# Patient Record
Sex: Male | Born: 1995
Health system: Southern US, Community
[De-identification: ages and names within clinical notes are randomized; demographics above are authoritative.]

## PROBLEM LIST (undated history)

## (undated) DIAGNOSIS — F3010 Manic episode without psychotic symptoms, unspecified: Secondary | ICD-10-CM

## (undated) DIAGNOSIS — F39 Unspecified mood [affective] disorder: Secondary | ICD-10-CM

---

## 2015-08-30 DIAGNOSIS — F39 Unspecified mood [affective] disorder: Secondary | ICD-10-CM

## 2015-08-30 HISTORY — DX: Unspecified mood (affective) disorder: F39

## 2016-08-21 ENCOUNTER — Encounter (HOSPITAL_COMMUNITY): Payer: Self-pay | Admitting: *Deleted

## 2016-08-21 ENCOUNTER — Emergency Department (HOSPITAL_COMMUNITY)
Admission: EM | Admit: 2016-08-21 | Discharge: 2016-08-23 | Disposition: A | Discharge status: Other Acute Inpatient Hospital | Payer: Self-pay | Attending: Emergency Medicine | Admitting: Emergency Medicine

## 2016-08-21 DIAGNOSIS — Z79899 Other long term (current) drug therapy: Secondary | ICD-10-CM | POA: Insufficient documentation

## 2016-08-21 DIAGNOSIS — Z008 Encounter for other general examination: Secondary | ICD-10-CM

## 2016-08-21 DIAGNOSIS — F250 Schizoaffective disorder, bipolar type: Secondary | ICD-10-CM | POA: Insufficient documentation

## 2016-08-21 HISTORY — DX: Unspecified mood (affective) disorder: F39

## 2016-08-21 HISTORY — DX: Manic episode without psychotic symptoms, unspecified: F30.10

## 2016-08-21 LAB — RAPID URINE DRUG SCREEN, HOSP PERFORMED
AMPHETAMINES: NOT DETECTED
BARBITURATES: NOT DETECTED
BENZODIAZEPINES: NOT DETECTED
COCAINE: NOT DETECTED
Opiates: NOT DETECTED
TETRAHYDROCANNABINOL: POSITIVE — AB

## 2016-08-21 LAB — CBC WITH DIFFERENTIAL/PLATELET
BASOS ABS: 0 10*3/uL (ref 0.0–0.1)
BASOS PCT: 0 %
Eosinophils Absolute: 0 10*3/uL (ref 0.0–0.7)
Eosinophils Relative: 0 %
HEMATOCRIT: 44.9 % (ref 39.0–52.0)
HEMOGLOBIN: 15.1 g/dL (ref 13.0–17.0)
Lymphocytes Relative: 15 %
Lymphs Abs: 1.7 10*3/uL (ref 0.7–4.0)
MCH: 28.3 pg (ref 26.0–34.0)
MCHC: 33.6 g/dL (ref 30.0–36.0)
MCV: 84.2 fL (ref 78.0–100.0)
MONOS PCT: 7 %
Monocytes Absolute: 0.8 10*3/uL (ref 0.1–1.0)
NEUTROS ABS: 8.3 10*3/uL — AB (ref 1.7–7.7)
NEUTROS PCT: 78 %
Platelets: 246 10*3/uL (ref 150–400)
RBC: 5.33 MIL/uL (ref 4.22–5.81)
RDW: 12.4 % (ref 11.5–15.5)
WBC: 10.8 10*3/uL — ABNORMAL HIGH (ref 4.0–10.5)

## 2016-08-21 LAB — COMPREHENSIVE METABOLIC PANEL
ALBUMIN: 4.5 g/dL (ref 3.5–5.0)
ALT: 17 U/L (ref 17–63)
ANION GAP: 14 (ref 5–15)
AST: 23 U/L (ref 15–41)
Alkaline Phosphatase: 62 U/L (ref 38–126)
BILIRUBIN TOTAL: 1.3 mg/dL — AB (ref 0.3–1.2)
BUN: 11 mg/dL (ref 6–20)
CO2: 21 mmol/L — ABNORMAL LOW (ref 22–32)
Calcium: 10.1 mg/dL (ref 8.9–10.3)
Chloride: 104 mmol/L (ref 101–111)
Creatinine, Ser: 1.39 mg/dL — ABNORMAL HIGH (ref 0.61–1.24)
GFR calc Af Amer: >60 mL/min (ref >60)
GFR calc non Af Amer: >60 mL/min (ref >60)
GLUCOSE: 89 mg/dL (ref 65–99)
POTASSIUM: 3.7 mmol/L (ref 3.5–5.1)
SODIUM: 139 mmol/L (ref 135–145)
TOTAL PROTEIN: 8.2 g/dL — AB (ref 6.5–8.1)

## 2016-08-21 LAB — VALPROIC ACID LEVEL: Valproic Acid Lvl: 20 ug/mL — ABNORMAL LOW (ref 50.0–100.0)

## 2016-08-21 LAB — ETHANOL

## 2016-08-21 LAB — SALICYLATE LEVEL: Salicylate Lvl: <7 mg/dL (ref 2.8–30.0)

## 2016-08-21 LAB — ACETAMINOPHEN LEVEL: Acetaminophen (Tylenol), Serum: <10 ug/mL — ABNORMAL LOW (ref 10–30)

## 2016-08-21 LAB — LIPASE, BLOOD: Lipase: 16 U/L (ref 11–51)

## 2016-08-21 MED ORDER — ONDANSETRON HCL 4 MG PO TABS: 4.0000 mg | ORAL_TABLET | Freq: Three times a day (TID) | ORAL | Status: DC | PRN

## 2016-08-21 MED ORDER — ACETAMINOPHEN 325 MG PO TABS: 650.0000 mg | ORAL_TABLET | ORAL | Status: DC | PRN

## 2016-08-21 MED ORDER — ZOLPIDEM TARTRATE 5 MG PO TABS
5.0000 mg | ORAL_TABLET | Freq: Every evening | ORAL | Status: DC | PRN
  Filled 2016-08-21: qty 1

## 2016-08-21 MED ORDER — OLANZAPINE 2.5 MG PO TABS
2.5000 mg | ORAL_TABLET | Freq: Every day | ORAL | Status: DC
  Administered 2016-08-21: 2.5 mg via ORAL
  Filled 2016-08-21: qty 1

## 2016-08-21 MED ORDER — NICOTINE 21 MG/24HR TD PT24
21.0000 mg | MEDICATED_PATCH | Freq: Every day | TRANSDERMAL | Status: DC
  Filled 2016-08-21: qty 1

## 2016-08-21 MED ORDER — ALUM & MAG HYDROXIDE-SIMETH 200-200-20 MG/5ML PO SUSP: 30.0000 mL | ORAL | Status: DC | PRN

## 2016-08-21 MED ORDER — DIVALPROEX SODIUM 125 MG PO DR TAB
125.0000 mg | DELAYED_RELEASE_TABLET | Freq: Two times a day (BID) | ORAL | Status: DC
  Filled 2016-08-21 (×2): qty 1

## 2016-08-21 NOTE — ED Notes (Signed)
Pt refused to take depakote and ambien. Pt educated on the need to take his regular home medications, pt continued to refuse.

## 2016-08-21 NOTE — ED Triage Notes (Addendum)
Pt returned home on 08-20-16 after being out of town to visit a friend. Parent in room with PT reports finding out  He did not take is meds as directed and was manic last night at home. Pt with a previous DX . On manic behavior. Pt current Meds Divalproex 125mg  2 PO BID and Olanzapine 2.5 mg 1 PO q HS.

## 2016-08-21 NOTE — ED Notes (Signed)
Relieved sitter for lunch. Pt is pacing around room with arms crossed rubbing his shoulders repeatedly. Pt given food but is not interested. Pt watching TV while pacing.

## 2016-08-21 NOTE — ED Notes (Signed)
While taking pt's vital signs, pt showed confusion with basic instructions. Pt had to be told multiple times that the pulse ox goes on his finger but kept holding out his arm. Attempted to take pt's temperature but was unable to obtain d/t pt not understanding what was being asked of him. Pt's mother is bedside and attempted to coax pt into having his temperature taken. Pt eventually grabbed thermometer probe and placed it in mouth but immediately removed it. Advised pt that we would try again later. Sitter is bedside.

## 2016-08-21 NOTE — ED Notes (Signed)
For updates/questions call Natasha MeadJeri (mother) [phone] or Steward DroneBrenda (grandmother) [phone]

## 2016-08-21 NOTE — ED Notes (Signed)
Pt pacing his room, visibly anxious, not answering questions concerning auditory/visual hallucinations, SI/HI.

## 2016-08-21 NOTE — BH Assessment (Signed)
Tele Assessment Note   Nathan Holloway is an 20 y.o. male who was brought to the emergency room by his mother after she witnessed what she believes to be the beginning of a manic episode. Pt was unable to answer most of assessors questions and appeared to be thought blocking during assessment. At times he seemed to not be able to understand what the assessor was asking. Pt mother stated she observed similar behavior and that's why she brought him in. He has a history of inpatient hospitalization in 2014 when he had a manic episode with psychosis after smoking marijuana. She states that he has been on medication since and has been stable over the past couple of years. She states that they are from [place] and moved down here a year ago after her father died. The pt has been seeing Dr. Lenna SciaraAvbere at [facility] in [place] for his medication and he is currently taking 2.5 zyprexa and 125mg  of Depakote daily. Mom states that the doctor has been weaning him from his zyprexa and he started out taking 10mg  earlier in the year. Pt went to see some friends 2 days ago in [place] and she states he admitted to smoking marijuana in this time she says that the last time he was hospitalized he had used marijuana as well. She states that he was up all last night and stated that his "head felt fuzzy and he couldn't think clearly". She states that he has declined since then and has been pacing at her home so she brought him in to be evaluated. Mom states that pt has not made any statements about hurting himself or others. He was asked this question but did not respond to assessor due to the decline in his presentation.   Pt meets criteria for inpatient hospitalization for stabilization of acute psychosis and onset of manic episode per Claudette Headonrad Withrow, NP   Diagnosis: Bipolar 1 Disorder- recent episode manic, Schizoaffective disorder   Past Medical History:  Past Medical History:  Diagnosis Date  . Manic behavior (HCC)   .  Psychotic affective disorder (HCC) 08/30/2015    History reviewed. No pertinent surgical history.  Family History: History reviewed. No pertinent family history.  Social History:  reports that he has never smoked. He has never used smokeless tobacco. He reports that he uses drugs, including Marijuana. He reports that he does not drink alcohol.  Additional Social History:  Alcohol / Drug Use History of alcohol / drug use?: Yes Substance #1 Name of Substance 1: Marijuana  1 - Last Use / Amount: unknown amount- yesterday  CIWA: CIWA-Ar BP: (!) 165/116 Pulse Rate: 97 COWS:    PATIENT STRENGTHS: (choose at least two) Average or above average intelligence Supportive family/friends  Allergies: No Known Allergies  Home Medications:  (Not in a hospital admission)  OB/GYN Status:  No LMP for male patient.  General Assessment Data [facility] Assessment: In system Is this a Tele or Face-to-Face Assessment?: Tele Assessment Is this an Initial Assessment or a Re-assessment for this encounter?: Initial Assessment Marital status: Single Living Arrangements: Parent Can pt return to current living arrangement?: Yes Admission Status: Voluntary Is patient capable of signing voluntary admission?: Yes Referral Source: Self/Family/Friend Insurance type: None listed     Crisis Care Plan Living Arrangements: Parent Legal Guardian: Mother Name of Psychiatrist: [facility]Abueve [facility] Name of Therapist: [facility]  Education Status Is patient currently in school?: No Highest grade of school  patient has completed: 12th  Risk to self with the past 6 months Suicidal Ideation: No Has patient been a risk to self within the past 6 months prior to admission? : No Suicidal Intent: No Has patient had any suicidal intent within the past 6 months prior to admission? : No Is patient at risk for suicide?: No Suicidal Plan?: No Has patient had any  suicidal plan within the past 6 months prior to admission? : No Access to Means: No What has been your use of drugs/alcohol within the last 12 months?: Using marijuana in excess the past couple of days Previous Attempts/Gestures:  (none known) How many times?: 0 Other Self Harm Risks: Pt not oriented- thought blocking,  Triggers for Past Attempts: None known Intentional Self Injurious Behavior: None Family Suicide History: No Recent stressful life event(s): Other (Comment), Loss (Comment) (father passed away at age 76 and grandfather last year) Persecutory voices/beliefs?: No Depression: Yes Depression Symptoms: Despondent Substance abuse history and/or treatment for substance abuse?: Yes Suicide prevention information given to non-admitted patients: Not applicable  Risk to Others within the past 6 months Homicidal Ideation: No Does patient have any lifetime risk of violence toward others beyond the six months prior to admission? : No Thoughts of Harm to Others: No Current Homicidal Intent: No Current Homicidal Plan: No Access to Homicidal Means: No Identified Victim: none History of harm to others?: No Assessment of Violence: None Noted Violent Behavior Description: none Does patient have access to weapons?: No Criminal Charges Pending?: No Does patient have a court date: No Is patient on probation?: No  Psychosis Hallucinations:  (appears to be responding to internal stimuli) Delusions: Unspecified  Mental Status Report Appearance/Hygiene: Bizarre Eye Contact: Poor Motor Activity: Freedom of movement Speech: Slow, Pressured Level of Consciousness: Quiet/awake Mood: Suspicious Affect: Constricted, Flat Anxiety Level: Moderate Thought Processes: Thought Blocking Judgement: Impaired Orientation: Not oriented Obsessive Compulsive Thoughts/Behaviors: Unable to Assess  Cognitive Functioning Concentration: Poor Memory: Remote Intact, Recent Impaired IQ:  Average Insight: Poor Impulse Control: Fair Appetite: Poor Sleep: Decreased Total Hours of Sleep:  (did not sleep last night) Vegetative Symptoms: None  ADLScreening [facility] Assessment Services) Patient's cognitive ability adequate to safely complete daily activities?: No (Pt not oriented) Patient able to express need for assistance with ADLs?: Yes Independently performs ADLs?: Yes (appropriate for developmental age)  Prior Inpatient Therapy Prior Inpatient Therapy: Yes Prior Therapy Dates: 2014 Prior Therapy Facilty/Provider(s): out of state Reason for Treatment: psychosis (manic episode)_  Prior Outpatient Therapy Prior Outpatient Therapy: Yes Prior Therapy Dates: ongoing Prior Therapy Facilty/Provider[facility]): [facility]  Reason for Treatment: Medication Management Does patient have an ACCT team?: No Does patient have Intensive In-House Services?  : No Does patient have [facility] services? : No Does patient have P4CC services?: No  ADL Screening (condition at time of admission) Patient's cognitive ability adequate to safely complete daily activities?: No (Pt not oriented) Is the patient deaf or have difficulty hearing?: No Does the patient have difficulty seeing, even when wearing glasses/contacts?: No Does the patient have difficulty concentrating, remembering, or making decisions?: No Patient able to express need for assistance with ADLs?: Yes Does the patient have difficulty dressing or bathing?: No Independently performs ADLs?: Yes (appropriate for developmental age) Does the patient have difficulty walking or climbing stairs?: No Weakness of Legs: None Weakness of Arms/Hands: None  Home Assistive Devices/Equipment Home Assistive Devices/Equipment: None  Therapy Consults (therapy consults require a physician order) PT Evaluation Needed: No OT Evalulation Needed: No SLP Evaluation  Needed: No Abuse/Neglect Assessment (Assessment to be complete while patient is alone) Physical  Abuse: Denies Verbal Abuse: Denies Sexual Abuse: Denies Exploitation of patient/patient's resources: Denies Self-Neglect: Denies Values / Beliefs Cultural Requests During Hospitalization: None Spiritual Requests During Hospitalization: None Consults Spiritual Care Consult Needed: No Social Work Consult Needed: No Merchant navy officerAdvance Directives (For Healthcare) Does Patient Have a Medical Advance Directive?: No Would patient like information on creating a medical advance directive?: No - Patient declined Nutrition Screen- [facility] Adult/WL/AP Patient's home diet: Regular Has the patient recently lost weight without trying?: No Has the patient been eating poorly because of a decreased appetite?: No Malnutrition Screening Tool Score: 0  Additional Information 1:1 In Past 12 Months?: No CIRT Risk: No Elopement Risk: No Does patient have medical clearance?: Yes     Disposition:  Disposition Initial Assessment Completed for this Encounter: Yes Disposition of Patient: Inpatient treatment program Type of inpatient treatment program: Adult  Ashleen Demma 08/21/2016 6:59 PM

## 2016-08-21 NOTE — ED Notes (Signed)
Pt admitted to hearing "commanding" voices. Denied to elaborate on what the voices were saying.

## 2016-08-21 NOTE — ED Provider Notes (Signed)
[facility] Provider Note   CSN: [id number] Arrival date & time: 08/21/16  1607  By signing my name below, I, Nathan Holloway, attest that this documentation has been prepared under the direction and in the presence of Nathan MeresAshley Meyer, PA-C. Electronically Signed: Orpah CobbMaurice Holloway , ED Scribe. 08/21/16. 7:27 PM.     History   Chief Complaint Chief Complaint  Patient presents with  . Medical Clearance    HPI  Level 5 caveat: Psychiatric Disorder -   HPI Comments: Nathan Holloway is a 20 y.o. male with hx of bipolar schizoaffective disorder (2014) who presents to the Emergency Department for medical clearance onset x1 day. History predominantly provided by mother as patient does not answer questions and when he does mostly nods or shakes head. Per mother, pt returned from a trip to [place] yesterday where he was not compliant with his medication for a week. Pt reportedly used marijuana while on the trip and has since been "foggy." Mom states these symptoms are consistent when he is non-compliant with medication. Pt recently had dosage of Zyprexa 5mg  lowered to 2.5mg ; however, this was a couple months ago and has been effective. Mother states she was frightened last night because pt was not his normal self. Per mom, "He would not answer normal questions, did not sleep well, decrease appetite, and I think he hasn't slept in 2 days." Pt reports abdominal pain and constipation. Pt denies CP, HI, SI,  difficulty breathing, nausea, vomiting, and dysuria, or any other complaints. Pt did not respond to question regarding V/A hallucinations.   The history is provided by the patient and a parent. No language interpreter was used.    Past Medical History:  Diagnosis Date  . Manic behavior (HCC)   . Psychotic affective disorder (HCC) 08/30/2015    There are no active problems to display for this patient.   History reviewed. No pertinent surgical history.     Home Medications     Prior to Admission medications   Medication Sig Start Date End Date Taking? Authorizing Provider  divalproex (DEPAKOTE) 125 MG DR tablet Take 125 mg by mouth 2 (two) times daily.   Yes Historical Provider, MD  OLANZapine (ZYPREXA) 2.5 MG tablet Take 2.5 mg by mouth at bedtime.   Yes Historical Provider, MD    Family History History reviewed. No pertinent family history.  Social History Social History  Substance Use Topics  . Smoking status: Never Smoker  . Smokeless tobacco: Never Used  . Alcohol use No     Allergies   Patient has no known allergies.   Review of Systems Review of Systems  Constitutional: Positive for appetite change.  Respiratory: Negative for shortness of breath.   Cardiovascular: Negative for chest pain.  Gastrointestinal: Positive for abdominal pain and constipation. Negative for nausea and vomiting.  Genitourinary: Negative for dysuria.  Psychiatric/Behavioral: Negative for suicidal ideas.  All other systems reviewed and are negative.    Physical Exam Updated Vital Signs BP (!) 159/115 (BP Location: Left Arm)   Pulse 97   Resp 18   Ht 5\' 8"  (1.727 m)   Wt 83 kg   SpO2 98%   BMI 27.83 kg/m   Physical Exam  Constitutional: He appears well-developed and well-nourished. No distress.  HENT:  Head: Normocephalic and atraumatic.  Mouth/Throat: Oropharynx is clear and moist. No oropharyngeal exudate.  Eyes: Conjunctivae and EOM are normal. Pupils are equal, round, and reactive to light. Right eye exhibits no discharge. Left eye exhibits no  discharge. No scleral icterus.  Neck: Normal range of motion and phonation normal. Neck supple. No neck rigidity. Normal range of motion present.  Cardiovascular: Normal rate, regular rhythm, normal heart sounds and intact distal pulses.   No murmur heard. Pulmonary/Chest: Effort normal and breath sounds normal. No stridor. No respiratory distress. He has no wheezes. He has no rales.  Abdominal: Soft. He  exhibits no distension. There is no tenderness. There is no rigidity, no rebound, no guarding and no CVA tenderness.  Musculoskeletal: Normal range of motion. He exhibits no edema.  Neurological: He is alert. He is not disoriented. Coordination and gait normal. GCS eye subscore is 4. GCS verbal subscore is 5. GCS motor subscore is 6.  Moves all extremities with ease.  Skin: Skin is warm and dry. He is not diaphoretic.  Psychiatric: He has a normal mood and affect. He is withdrawn. He expresses no homicidal and no suicidal ideation. He is noncommunicative ( intermittent).  Nursing note and vitals reviewed.    ED Treatments / Results   DIAGNOSTIC STUDIES: Oxygen Saturation is 98% on RA, normal by my interpretation.   COORDINATION OF CARE: 7:27 PM-Discussed next steps with pt. Pt verbalized understanding and is agreeable with the plan.    Labs (all labs ordered are listed, but only abnormal results are displayed) Labs Reviewed  COMPREHENSIVE METABOLIC PANEL - Abnormal; Notable for the following:       Result Value   CO2 21 (*)    Creatinine, Ser 1.39 (*)    Total Protein 8.2 (*)    Total Bilirubin 1.3 (*)    All other components within normal limits  CBC WITH DIFFERENTIAL/PLATELET - Abnormal; Notable for the following:    WBC 10.8 (*)    Neutro Abs 8.3 (*)    All other components within normal limits  RAPID URINE DRUG SCREEN, HOSP PERFORMED - Abnormal; Notable for the following:    Tetrahydrocannabinol POSITIVE (*)    All other components within normal limits  ACETAMINOPHEN LEVEL - Abnormal; Notable for the following:    Acetaminophen (Tylenol), Serum <10 (*)    All other components within normal limits  VALPROIC ACID LEVEL - Abnormal; Notable for the following:    Valproic Acid Lvl 20 (*)    All other components within normal limits  ETHANOL  SALICYLATE LEVEL  LIPASE, BLOOD    EKG  EKG Interpretation None       Radiology No results  found.  Procedures Procedures (including critical care time)  Medications Ordered in ED Medications  alum & mag hydroxide-simeth (MAALOX/MYLANTA) 200-200-20 MG/5ML suspension 30 mL (not administered)  ondansetron (ZOFRAN) tablet 4 mg (not administered)  nicotine (NICODERM CQ - dosed in mg/24 hours) patch 21 mg (not administered)  zolpidem (AMBIEN) tablet 5 mg (not administered)  acetaminophen (TYLENOL) tablet 650 mg (not administered)  divalproex (DEPAKOTE) DR tablet 125 mg (not administered)  OLANZapine (ZYPREXA) tablet 2.5 mg (not administered)     Initial Impression / Assessment and Plan / ED Course  I have reviewed the triage vital signs and the nursing notes.  Pertinent labs & imaging results that were available during my care of the patient were reviewed by me and considered in my medical decision making (see chart for details).  Clinical Course     Patient presents to ED for medical clearance. Pt h/o bipolar schizoaffective disorder. History is primary provided by mother as patient does not verbally answer questions and is evasive when asked questions. Patient is non-toxic appearing  in NAD. Pt refused temperature. Pt is hypertensive, however, difficulty to accurately assess given pt is clenching and unclenching arm.  Heart RRR. Lungs CTABL. Abdomen soft, non-tender, non-distended. Moves all extremities with ease. Ambulatory. Clearance labs ordered. TTS consulted.   UDS +THC. Acetaminophen, salicylate, ethanol nml. Lipase nml - doubt pancreatitis. Mild elevation in creatinine - ?dehydration, pt tolerating PO fluids, CMP otherwise grossly re-assuring. CBC shows mild elevation in WBC - ?reactive. Depakote level is sub-therapeutic. Pt is medically cleared.    [facility] with [facility]. Inpatient recommended. Pt will be placed in psych hold and home medications ordered for bed placement.   Final Clinical Impressions(s) / ED Diagnoses   Final diagnoses:  Medical clearance for psychiatric  admission  Schizoaffective disorder, bipolar type [facility])    New Prescriptions New Prescriptions   No medications on file   I personally performed the services described in this documentation, which was scribed in my presence. The recorded information has been reviewed and is accurate.     Lona Kettle, PA-C 08/21/16 1910    [address] [place], [place] 08/21/16 1928    Marily Memos, MD 08/22/16 [phone]

## 2016-08-21 NOTE — ED Notes (Signed)
Pt attempting to close the door. Advised pt that door must remain open

## 2016-08-21 NOTE — ED Notes (Signed)
Pt and family informed of the Pod C rules. Pt's belongings sent with his mother.

## 2016-08-21 NOTE — ED Triage Notes (Signed)
Pt in paper scrubs  And  Security  wanded by security

## 2016-08-21 NOTE — ED Notes (Signed)
[place]any, Child psychotherapisttaffing Office, aware of need for sitter. Advised she will send sitter.

## 2016-08-22 MED ORDER — DIVALPROEX SODIUM 125 MG PO CSDR
125.0000 mg | DELAYED_RELEASE_CAPSULE | Freq: Two times a day (BID) | ORAL | Status: DC
  Administered 2016-08-22: 125 mg via ORAL
  Filled 2016-08-22 (×4): qty 1

## 2016-08-22 MED ORDER — HALOPERIDOL LACTATE 5 MG/ML IJ SOLN
5.0000 mg | Freq: Once | INTRAMUSCULAR | Status: AC
  Administered 2016-08-22: 5 mg via INTRAMUSCULAR
  Filled 2016-08-22: qty 1

## 2016-08-22 MED ORDER — OLANZAPINE 10 MG PO TBDP
10.0000 mg | ORAL_TABLET | Freq: Every day | ORAL | Status: DC
  Filled 2016-08-22 (×2): qty 1

## 2016-08-22 MED ORDER — LORAZEPAM 2 MG/ML IJ SOLN
INTRAMUSCULAR | Status: AC
  Administered 2016-08-22: 2 mg via INTRAMUSCULAR
  Filled 2016-08-22: qty 1

## 2016-08-22 MED ORDER — LORAZEPAM 2 MG/ML IJ SOLN
2.0000 mg | Freq: Once | INTRAMUSCULAR | Status: AC
  Administered 2016-08-22: 2 mg via INTRAMUSCULAR
  Filled 2016-08-22: qty 1

## 2016-08-22 MED ORDER — HALOPERIDOL LACTATE 5 MG/ML IJ SOLN
INTRAMUSCULAR | Status: AC
  Administered 2016-08-22: 5 mg via INTRAMUSCULAR
  Filled 2016-08-22: qty 1

## 2016-08-22 NOTE — ED Notes (Addendum)
Pt continues to pace in his room, clearly becoming more agitated. Attempted therapeutic communication with the pt, but he was not receptive. EDP aware.

## 2016-08-22 NOTE — ED Triage Notes (Signed)
Pt in hall talking with family.

## 2016-08-22 NOTE — BH Assessment (Signed)
Writer reassessed pt this am. Pt is oriented to self, date, place and situation. He is a poor historian. Several seconds elapse before pt answers a question and it appears pt could be experiencing thought blocking. Pt sts he didn't take his meds on his recent trip "because I thought they were someone else's medicine". He is unable to consistently answer questions. Pt answers some questions with coherence and seems to ignore other questions altogether as if he does not hear them. Pt reports he isn't seeing anyone for outpatient MH treatment. He denies SI and HI. He denies [facility]. Pt's RN reports pt came back from the bathroom this am and proceeded to urinate for quite a while onto his mattress. Writer ran pt by Elta GuadeloupeLaurie Parks NP who recommends inpatient treatment.

## 2016-08-22 NOTE — ED Triage Notes (Signed)
PT refuses meal and meds at 1200. Pt awake and alert sitting on side of bed.

## 2016-08-22 NOTE — ED Notes (Signed)
Pt is standing in the doorway extremely anxious. Asked multiple times to return to room. Pt not cooperative at this time.

## 2016-08-22 NOTE — ED Notes (Signed)
Mother of pt called to check on pt. Informed mother of event that just recently occurred

## 2016-08-22 NOTE — ED Triage Notes (Signed)
Pt"s mother called a second time to request a call from the on coming MD. Mother reported she wanted to discuss an injection for sleep for PT. And to get the meds under control .

## 2016-08-22 NOTE — ED Provider Notes (Signed)
  Physical Exam  BP 164/96 (BP Location: Right Arm)   Pulse 108   Temp 98.7 F (37.1 C) (Oral)   Resp 19   Ht 5\' 8"  (1.727 m)   Wt 183 lb (83 kg)   SpO2 100%   BMI 27.83 kg/m   Physical Exam  ED Course  Procedures  MDM Patient seen by psych and qualify for inpatient psych admission. He has been refusing his meds. He was voluntary initially. He tried to escape and became aggressive with staff and unable to be redirected. Hasn't been sleeping for several days and refuses to talk. I filled out IVC paperwork. Will give ativan, haldol. Will continue to seek psych placement     Charlynne Panderavid Hsienta Elizer Bostic, MD 08/22/16 2226

## 2016-08-22 NOTE — ED Notes (Signed)
Spoke with Mother of Pt. Mother states it is okay to give pt a IM injections if need for calming down or for sleep. She states we may need security on stand by because she doesn't know how he will respond.

## 2016-08-22 NOTE — ED Triage Notes (Signed)
Pt walked to bathroom with sitter. Pt would not enter bathroom . Pt instructed that voiding on his mattress instead of bathroom.is not accepted behavior.

## 2016-08-22 NOTE — ED Notes (Signed)
Patient went to shower, refused to take a shower, so sitter walked with him back to room, and brought scrubs and socks and hyigene items back to desk unused.

## 2016-08-22 NOTE — ED Notes (Signed)
This RN tried to give pt his night time meds. Pt would not open mouth or speak. Pt just looked at RN. After about 15 mins for trying to get pt to take night time meds pt began to get a little angry and balled fist up. This RN told pt not to do that and he stopped. This RN has notified Dr. Silverio LayYao no further orders. psychiatrics will see in the morning

## 2016-08-22 NOTE — ED Triage Notes (Signed)
Pt refused depakote sprinkles mixed in applesauce as requested by PT.

## 2016-08-22 NOTE — ED Notes (Signed)
Pt tried to run. Security in the process of getting pt back to room. Dr, Silverio LayYao working on [employer] paper work

## 2016-08-22 NOTE — BH Assessment (Signed)
Under Review:   [facility]  [facility]  [place]  [place]  [place]  [facility] [facility]   [facility]   [facility]   [facility]   [facility]

## 2016-08-22 NOTE — ED Triage Notes (Signed)
TC from Pt's Mother for PT update

## 2016-08-22 NOTE — ED Notes (Signed)
Pt began to get violent with security. They laid hands on pt to redirect pt and pt got more violent pt got placed in hand cuffs on the ground. Pt continued to fight the security. Pt given Ativan and haldol IM. Verbal order from Dr. Ledell NossStinal for restraints

## 2016-08-22 NOTE — ED Notes (Signed)
Patient ate 65% of his Breakfast.

## 2016-08-22 NOTE — ED Notes (Addendum)
Pt refused to allow me to place pulse ox on finger. Finally convinced pt to allow me to check Bp. RN made aware. Pt is still very anxious and mumbles to himself.

## 2016-08-22 NOTE — ED Triage Notes (Signed)
TTS in progress 

## 2016-08-22 NOTE — ED Triage Notes (Addendum)
TC from from mother with long conversation about what treatment PT needs . Mother stated " Nathan Holloway needs an injection for sleep ,because sleep is very important." Mother reports Pt can not swallow meds but takes sprinkle caps in depakote. Pt's mother also reports he did not get the ambien for sleep last night . Pt mother also reports pt can swallow the Ambien.

## 2016-08-22 NOTE — ED Notes (Signed)
Patient standing in hallway outside room talking to mom and grandmother.

## 2016-08-22 NOTE — ED Notes (Signed)
Regular diet ordered for lunch, and a pack of cookies and sprite given to pt. During snack time.

## 2016-08-22 NOTE — [facility] Counselor (Signed)
Call from [facility] declining pt due to SA.  Beryle FlockMary Letroy Vazguez, MS, CRC, [facility] Triage Specialist [facility]

## 2016-08-22 NOTE — ED Notes (Signed)
Pt went to the bathroom and peed. Pt refused to flush the toilet and pt refused to wash his hands. Sitter did get the pt to use hand sanitizer. Pt currently on the phone with his mother.

## 2016-08-22 NOTE — ED Triage Notes (Signed)
Pt walking in hall with sitter . Pt returned to his room and stood at the foot of bed and voided onto mattress. Pt then attempted to walk into hall. Pt instructed to stay in his room.  Pt's sheets changed and room cleaned.

## 2016-08-22 NOTE — ED Triage Notes (Signed)
Depakote sprinkles are available  for PT now. Pt did report he mixed depakote sprinkles in applesauce .

## 2016-08-22 NOTE — ED Notes (Addendum)
Pt brought back to bed handcuffs removed and restraints applied to pt and attached to bed frame 2+ pluses on all 4 extremities at this time

## 2016-08-22 NOTE — ED Notes (Signed)
Pt calm and cooperative at this time.

## 2016-08-22 NOTE — ED Triage Notes (Signed)
Pt returned to his room with sitter and did not void in bathroom .

## 2016-08-22 NOTE — ED Triage Notes (Signed)
Family walking in room.

## 2016-08-22 NOTE — ED Notes (Signed)
Patient refused to eat lunch.

## 2016-08-23 MED ORDER — ZIPRASIDONE MESYLATE 20 MG IM SOLR
20.0000 mg | Freq: Once | INTRAMUSCULAR | Status: AC
  Administered 2016-08-23: 20 mg via INTRAMUSCULAR
  Filled 2016-08-23: qty 20

## 2016-08-23 MED ORDER — STERILE WATER FOR INJECTION IJ SOLN
INTRAMUSCULAR | Status: AC
  Filled 2016-08-23: qty 10

## 2016-08-23 NOTE — ED Notes (Signed)
Pt sitting on edge of bed - noted to appear sleepy. Pt leaning forward. Refuses to lie back on bed. Pt assisted to sit in recliner. Attempted to raise legs for pt, pt refused. Bedside table placed in front of pt. Pt noted to be

## 2016-08-23 NOTE — ED Notes (Signed)
Restrain removed to clean the pt, pt some how restless and fighting, verbal encourage given to pt the only reason he is on restrain is to keep him safe and prevent him from getting hurt and that if he relax and cooperate with care he will be able to be removed from restrains. Pt verbalized understanding.

## 2016-08-23 NOTE — ED Notes (Signed)
Mother, Natasha MeadJeri [phone]- [phone] - [facility]

## 2016-08-23 NOTE — Progress Notes (Signed)
Orthopedic Tech Progress Note Patient Details:  Veronia BeetsDaniel Lavell 12/29/1995 [id number]  Patient ID: Veronia Beetsaniel Pelham, male   DOB: 05/20/1996, 20 y.o.   MRN: [id number]   Saul FordyceJennifer C Courtney Fenlon 08/23/2016, 3:24 AMPatient did not have a computer in room. Patient was in restraints when I arrived, I checked all four extremities, pulses good. Patient was very agitated throughout the shift. Patient is incontinent urinated on himself. I Changed bed linens and patients clothing.

## 2016-08-23 NOTE — ED Notes (Signed)
Left Message for pt's mother so may advise of [facility] - [phone].

## 2016-08-23 NOTE — ED Notes (Signed)
Mother, Natasha MeadJeri, called and update given. Advised her will ask for Telepsych today d/t pt not sleeping,not eating, and refusing meds. Mother states he is not a "pill taker". She advised she will be coming to visit pt today - aware of visitation times.

## 2016-08-23 NOTE — ED Notes (Signed)
Pt standing in room then will sit in recliner intermittently. Pt continues to be mute.

## 2016-08-23 NOTE — ED Notes (Signed)
Patient was given  A ginger ale and a pack of gram crackers. A regular diet was ordered for Lunch.

## 2016-08-23 NOTE — Progress Notes (Signed)
Pt accepted to [facility] by Dr. Jeannine KittenFarah. Tenisha in intake states pt can arrive anytime today, and number for report is [phone]. Notified [facility]  Ilean SkillMeghan Eulanda Dorion, MSW, LCSW Clinical Social Work, Disposition  08/23/2016 [phone]

## 2016-08-23 NOTE — ED Notes (Signed)
Pt removed from restrains, and becoming progressively anxious, Dr Mora Bellmanni and Security notified.

## 2016-08-23 NOTE — ED Notes (Signed)
Copy of IVC paperwork sent to Medical Records.

## 2017-03-30 DIAGNOSIS — F28 Other psychotic disorder not due to a substance or known physiological condition: Secondary | ICD-10-CM | POA: Diagnosis not present

## 2017-06-08 DIAGNOSIS — F28 Other psychotic disorder not due to a substance or known physiological condition: Secondary | ICD-10-CM | POA: Diagnosis not present

## 2017-10-02 DIAGNOSIS — F28 Other psychotic disorder not due to a substance or known physiological condition: Secondary | ICD-10-CM | POA: Diagnosis not present

## 2018-01-01 DIAGNOSIS — F28 Other psychotic disorder not due to a substance or known physiological condition: Secondary | ICD-10-CM | POA: Diagnosis not present

## 2018-08-08 DIAGNOSIS — Z79899 Other long term (current) drug therapy: Secondary | ICD-10-CM | POA: Diagnosis not present

## 2018-08-08 DIAGNOSIS — F259 Schizoaffective disorder, unspecified: Secondary | ICD-10-CM | POA: Diagnosis not present

## 2018-08-08 DIAGNOSIS — Z13220 Encounter for screening for lipoid disorders: Secondary | ICD-10-CM | POA: Diagnosis not present

## 2018-08-08 DIAGNOSIS — Z0000 Encounter for general adult medical examination without abnormal findings: Secondary | ICD-10-CM | POA: Diagnosis not present

## 2018-08-08 DIAGNOSIS — Z131 Encounter for screening for diabetes mellitus: Secondary | ICD-10-CM | POA: Diagnosis not present

## 2019-08-09 DIAGNOSIS — F259 Schizoaffective disorder, unspecified: Secondary | ICD-10-CM | POA: Diagnosis not present

## 2019-08-09 DIAGNOSIS — Z0000 Encounter for general adult medical examination without abnormal findings: Secondary | ICD-10-CM | POA: Diagnosis not present

## 2019-09-05 DIAGNOSIS — F28 Other psychotic disorder not due to a substance or known physiological condition: Secondary | ICD-10-CM | POA: Diagnosis not present

## 2019-10-31 DIAGNOSIS — F28 Other psychotic disorder not due to a substance or known physiological condition: Secondary | ICD-10-CM | POA: Diagnosis not present

## 2019-12-19 DIAGNOSIS — F259 Schizoaffective disorder, unspecified: Secondary | ICD-10-CM | POA: Diagnosis not present

## 2019-12-19 DIAGNOSIS — J302 Other seasonal allergic rhinitis: Secondary | ICD-10-CM | POA: Diagnosis not present

## 2020-01-30 DIAGNOSIS — F28 Other psychotic disorder not due to a substance or known physiological condition: Secondary | ICD-10-CM | POA: Diagnosis not present

## 2020-03-19 ENCOUNTER — Emergency Department (HOSPITAL_COMMUNITY)
Admission: EM | Admit: 2020-03-19 | Discharge: 2020-03-19 | Disposition: A | Discharge status: Home / Self Care | Payer: Medicaid Other | Attending: Emergency Medicine | Admitting: Emergency Medicine

## 2020-03-19 ENCOUNTER — Other Ambulatory Visit: Payer: Self-pay

## 2020-03-19 ENCOUNTER — Emergency Department (HOSPITAL_COMMUNITY): Payer: Medicaid Other

## 2020-03-19 DIAGNOSIS — K2900 Acute gastritis without bleeding: Secondary | ICD-10-CM | POA: Insufficient documentation

## 2020-03-19 DIAGNOSIS — K30 Functional dyspepsia: Secondary | ICD-10-CM | POA: Diagnosis not present

## 2020-03-19 LAB — BASIC METABOLIC PANEL
Anion gap: 9 (ref 5–15)
BUN: 11 mg/dL (ref 6–20)
CO2: 28 mmol/L (ref 22–32)
Calcium: 9.5 mg/dL (ref 8.9–10.3)
Chloride: 102 mmol/L (ref 98–111)
Creatinine, Ser: 1.51 mg/dL — ABNORMAL HIGH (ref 0.61–1.24)
GFR calc Af Amer: >60 mL/min (ref >60)
GFR calc non Af Amer: >60 mL/min (ref >60)
Glucose, Bld: 77 mg/dL (ref 70–99)
Potassium: 3.9 mmol/L (ref 3.5–5.1)
Sodium: 139 mmol/L (ref 135–145)

## 2020-03-19 LAB — CBC
HCT: 45.3 % (ref 39.0–52.0)
Hemoglobin: 14.6 g/dL (ref 13.0–17.0)
MCH: 28.9 pg (ref 26.0–34.0)
MCHC: 32.2 g/dL (ref 30.0–36.0)
MCV: 89.5 fL (ref 80.0–100.0)
Platelets: 250 10*3/uL (ref 150–400)
RBC: 5.06 MIL/uL (ref 4.22–5.81)
RDW: 12.5 % (ref 11.5–15.5)
WBC: 7 10*3/uL (ref 4.0–10.5)
nRBC: 0 % (ref 0.0–0.2)

## 2020-03-19 LAB — TROPONIN I (HIGH SENSITIVITY): Troponin I (High Sensitivity): 4 ng/L (ref <18)

## 2020-03-19 MED ORDER — ALUM & MAG HYDROXIDE-SIMETH 200-200-20 MG/5ML PO SUSP
30.0000 mL | Freq: Once | ORAL | Status: AC
  Administered 2020-03-19: 16:00:00 30 mL via ORAL
  Filled 2020-03-19: qty 30

## 2020-03-19 MED ORDER — LIDOCAINE VISCOUS HCL 2 % MT SOLN
15.0000 mL | Freq: Once | OROMUCOSAL | Status: AC
  Administered 2020-03-19: 16:00:00 15 mL via ORAL
  Filled 2020-03-19: qty 15

## 2020-03-19 NOTE — Discharge Instructions (Addendum)
Return if any problems.  Pepcid OTC for the next 3-4 days

## 2020-03-19 NOTE — ED Provider Notes (Signed)
[facility] EMERGENCY DEPARTMENT Provider Note   CSN: [id number] Arrival date & time: 03/19/20  1117     History Chief Complaint  Patient presents with  . Chest Pain    Nathan Holloway is a 24 y.o. male.  The history is provided by the patient. No language interpreter was used.  Chest Pain Pain location:  Epigastric Pain quality: aching   Pain radiates to:  Does not radiate Pain severity:  Moderate Onset quality:  Gradual Timing:  Constant Progression:  Worsening Chronicity:  New Context: not breathing   Relieved by:  Nothing Worsened by:  Nothing Ineffective treatments:  None tried Associated symptoms: no abdominal pain   Risk factors: no diabetes mellitus   Pt ate tacos and sushi and spicy noodles yesterday.  Pt reports discomfort in chest.     Past Medical History:  Diagnosis Date  . Manic behavior (HCC)   . Psychotic affective disorder (HCC) 08/30/2015    There are no problems to display for this patient.   No past surgical history on file.     No family history on file.  Social History   Tobacco Use  . Smoking status: Never Smoker  . Smokeless tobacco: Never Used  Substance Use Topics  . Alcohol use: No  . Drug use: Yes    Types: Marijuana    Comment: recent usage while out of town this week    Home Medications Prior to Admission medications   Medication Sig Start Date End Date Taking? Authorizing Provider  divalproex (DEPAKOTE SPRINKLE) 125 MG capsule Take 250 mg by mouth at bedtime.     [provider]  OLANZapine (ZYPREXA) 2.5 MG tablet Take 2.5 mg by mouth at bedtime.    [provider]    Allergies    Risperidone and related  Review of Systems   Review of Systems  Cardiovascular: Positive for chest pain.  Gastrointestinal: Negative for abdominal pain.  All other systems reviewed and are negative.   Physical Exam Updated Vital Signs BP 124/85 (BP Location: Left Arm)   Pulse 82   Temp 98.7 F  (37.1 C) (Oral)   Resp 14   Ht 5\' 9"  (1.753 m)   Wt 79.4 kg   SpO2 100%   BMI 25.84 kg/m   Physical Exam Vitals and nursing note reviewed.  Constitutional:      Appearance: He is well-developed.  HENT:     Head: Normocephalic.  Cardiovascular:     Rate and Rhythm: Normal rate and regular rhythm.     Heart sounds: Normal heart sounds.  Pulmonary:     Effort: Pulmonary effort is normal.     Breath sounds: Normal breath sounds.  Abdominal:     General: Bowel sounds are normal. There is no distension.     Palpations: Abdomen is soft.  Musculoskeletal:        General: Normal range of motion.     Cervical back: Normal range of motion.  Skin:    General: Skin is warm.  Neurological:     General: No focal deficit present.     Mental Status: He is alert and oriented to person, place, and time.  Psychiatric:        Mood and Affect: Mood normal.     ED Results / Procedures / Treatments   Labs (all labs ordered are listed, but only abnormal results are displayed) Labs Reviewed  BASIC METABOLIC PANEL - Abnormal; Notable for the following components:  Result Value   Creatinine, Ser 1.51 (*)    All other components within normal limits  CBC  TROPONIN I (HIGH SENSITIVITY)  TROPONIN I (HIGH SENSITIVITY)    EKG None  Radiology DG Chest 2 View  Result Date: 03/19/2020 CLINICAL DATA:  Central chest pain. EXAM: CHEST - 2 VIEW COMPARISON:  None. FINDINGS: The heart size and mediastinal contours are within normal limits. Both lungs are clear. The visualized skeletal structures are unremarkable. Negative for a pneumothorax. IMPRESSION: No active cardiopulmonary disease. Electronically Signed   By: Richarda Overlie M.D.   On: 03/19/2020 12:07    Procedures Procedures (including critical care time)  Medications Ordered in ED Medications  alum & mag hydroxide-simeth (MAALOX/MYLANTA) 200-200-20 MG/5ML suspension 30 mL (has no administration in time range)    And  lidocaine  (XYLOCAINE) 2 % viscous mouth solution 15 mL (has no administration in time range)    ED Course  I have reviewed the triage vital signs and the nursing notes.  Pertinent labs & imaging results that were available during my care of the patient were reviewed by me and considered in my medical decision making (see chart for details).    MDM Rules/Calculators/A&P                          MDM: Pt given a gi cocktail.  Pt feels much better. Pt advised otc pepcid.  Follow up with primary care  Final Clinical Impression(s) / ED Diagnoses Final diagnoses:  Acute gastritis without hemorrhage, unspecified gastritis type    Rx / DC Orders ED Discharge Orders    None    An After Visit Summary was printed and given to the patient.   Osie Cheeks 03/19/20 1626    Benjiman Core, MD 03/19/20 1627

## 2020-03-19 NOTE — ED Notes (Signed)
Patient left headphones in room after DC. Called patient to inform him. Patient returned and picked headphones up from front desk per tech

## 2020-03-19 NOTE — ED Triage Notes (Signed)
Pt here for central chest pain onset after eating last night. Feels like something is stuck in his chest.

## 2020-04-02 DIAGNOSIS — F259 Schizoaffective disorder, unspecified: Secondary | ICD-10-CM | POA: Diagnosis not present

## 2020-04-02 DIAGNOSIS — K219 Gastro-esophageal reflux disease without esophagitis: Secondary | ICD-10-CM | POA: Diagnosis not present

## 2020-04-02 DIAGNOSIS — J302 Other seasonal allergic rhinitis: Secondary | ICD-10-CM | POA: Diagnosis not present

## 2020-05-06 DIAGNOSIS — Z113 Encounter for screening for infections with a predominantly sexual mode of transmission: Secondary | ICD-10-CM | POA: Diagnosis not present

## 2020-05-06 DIAGNOSIS — Z114 Encounter for screening for human immunodeficiency virus [HIV]: Secondary | ICD-10-CM | POA: Diagnosis not present

## 2020-07-04 ENCOUNTER — Ambulatory Visit (HOSPITAL_COMMUNITY)
Admission: EM | Admit: 2020-07-04 | Discharge: 2020-07-05 | Disposition: A | Discharge status: Other Acute Inpatient Hospital | Payer: BC Managed Care – PPO | Attending: Behavioral Health | Admitting: Behavioral Health

## 2020-07-04 ENCOUNTER — Other Ambulatory Visit: Payer: Self-pay

## 2020-07-04 ENCOUNTER — Encounter (HOSPITAL_COMMUNITY): Payer: Self-pay

## 2020-07-04 DIAGNOSIS — Z20822 Contact with and (suspected) exposure to covid-19: Secondary | ICD-10-CM | POA: Insufficient documentation

## 2020-07-04 DIAGNOSIS — F319 Bipolar disorder, unspecified: Secondary | ICD-10-CM | POA: Insufficient documentation

## 2020-07-04 DIAGNOSIS — T43596A Underdosing of other antipsychotics and neuroleptics, initial encounter: Secondary | ICD-10-CM | POA: Insufficient documentation

## 2020-07-04 DIAGNOSIS — Z79899 Other long term (current) drug therapy: Secondary | ICD-10-CM | POA: Insufficient documentation

## 2020-07-04 DIAGNOSIS — F200 Paranoid schizophrenia: Secondary | ICD-10-CM

## 2020-07-04 LAB — POCT URINE DRUG SCREEN - MANUAL ENTRY (I-SCREEN)
POC Amphetamine UR: NOT DETECTED
POC Buprenorphine (BUP): NOT DETECTED
POC Cocaine UR: NOT DETECTED
POC Marijuana UR: NOT DETECTED
POC Methadone UR: NOT DETECTED
POC Methamphetamine UR: NOT DETECTED
POC Morphine: NOT DETECTED
POC Oxazepam (BZO): NOT DETECTED
POC Oxycodone UR: NOT DETECTED
POC Secobarbital (BAR): NOT DETECTED

## 2020-07-04 LAB — GLUCOSE, CAPILLARY: Glucose-Capillary: 112 mg/dL — ABNORMAL HIGH (ref 70–99)

## 2020-07-04 LAB — POC SARS CORONAVIRUS 2 AG -  ED: SARS Coronavirus 2 Ag: NEGATIVE

## 2020-07-04 MED ORDER — ACETAMINOPHEN 325 MG PO TABS: 650.0000 mg | ORAL_TABLET | Freq: Four times a day (QID) | ORAL | Status: DC | PRN

## 2020-07-04 MED ORDER — DIVALPROEX SODIUM 125 MG PO CSDR
500.0000 mg | DELAYED_RELEASE_CAPSULE | Freq: Once | ORAL | Status: AC
  Administered 2020-07-04: 500 mg via ORAL
  Filled 2020-07-04: qty 4

## 2020-07-04 MED ORDER — ALUM & MAG HYDROXIDE-SIMETH 200-200-20 MG/5ML PO SUSP: 30.0000 mL | ORAL | Status: DC | PRN

## 2020-07-04 MED ORDER — HYDROXYZINE HCL 25 MG PO TABS
25.0000 mg | ORAL_TABLET | Freq: Four times a day (QID) | ORAL | Status: DC | PRN
Start: 2020-07-04 — End: 2020-07-05

## 2020-07-04 MED ORDER — OLANZAPINE 5 MG PO TBDP
15.0000 mg | ORAL_TABLET | Freq: Every day | ORAL | Status: DC
  Administered 2020-07-04: 15 mg via ORAL
  Filled 2020-07-04: qty 1

## 2020-07-04 MED ORDER — MAGNESIUM HYDROXIDE 400 MG/5ML PO SUSP: 30.0000 mL | Freq: Every day | ORAL | Status: DC | PRN

## 2020-07-04 MED ORDER — DIVALPROEX SODIUM ER 500 MG PO TB24
500.0000 mg | ORAL_TABLET | Freq: Every day | ORAL | Status: DC
  Filled 2020-07-04: qty 1

## 2020-07-04 NOTE — ED Provider Notes (Signed)
Behavioral Health Admission H&P [facility](FBC & OBS)  Date: 07/04/20 Patient Name: Nathan Holloway MRN: [id number] Chief Complaint:  Chief Complaint  Patient presents with  . Paranoid  . Medication Problem      Diagnoses: Bipolar 1  HPI: Nathan BeetsDaniel Holloway a 24 year-old male who [facility] and [facility]. The patient's mom disclosed that he went to [place] to visit some friends and began to feel unsafe during his visit. The patient called his mom, and mom discussed that his conversation "did not make sense." The patient sister went to get him from [place]. Mom disclosed the patient's father passed away in 2008, and the patient was diagnosed with Bipolar in 2014. She voiced he has had four psychiatric hospitalizations, with two being in [place] and two being here in [place]. The patient currently takes Zyprexa 15 mg ODT at bedtime. The patient also takes Depakote 500 mg at bedrest. Mom disclosed the patient was doing great on his medications, he was attending college, and then he moved out independently. The patient stopped taking his medications. Mom voiced the patient began to decompensate. He had to drop out of school and was brought into the hospital this evening.    The patient is casually dressed, alert, and oriented x4. The patient speaks in a clear tone, at a high volume and fast pace. Motor behavior appears normal. Eye contact is good. The patient's mood is manic, and his affect is congruent with his mood. The thought process is coherent and irrelevant. The patient denies any response to internal stimuli. The patient is experiencing delusional thought content. The patient was cooperative throughout the assessment. On some occasions, he had to be encouraged to allow the staff to perform a specific assessment. He agrees to inpatient treatment.  PHQ 2-9:     Total Time spent with patient: 45 minutes  Musculoskeletal  Strength & Muscle Tone: within normal  limits Gait & Station: normal Patient leans: N/A  Psychiatric Specialty Exam  Presentation General Appearance: Appropriate for Environment;Casual  Eye Contact:Fleeting  Speech:Pressured  Speech Volume:Increased  Handedness:Right   Mood and Affect  Mood:Euphoric;Anxious  Affect:Inappropriate;Full Range   Thought Process  Thought Processes:Disorganized  Descriptions of Associations:Loose  Orientation:Full (Time, Place and Person)  Thought Content:Illogical;Paranoid Ideation;Scattered;Tangential  Hallucinations:Hallucinations: None  Ideas of Reference:None  Suicidal Thoughts:Suicidal Thoughts: No  Homicidal Thoughts:Homicidal Thoughts: No   Sensorium  Memory:Immediate Good;Recent Fair;Remote Fair  Judgment:Poor  Insight:Lacking   Executive Functions  Concentration:Good  Attention Span:Fair  Recall:Fair  Fund of Knowledge:Fair  Language:Fair   Psychomotor Activity  Psychomotor Activity:Psychomotor Activity: Normal   Assets  Assets:Communication Skills;Desire for Improvement;Resilience;Social Support   Sleep  Sleep:Sleep: Fair   Physical Exam Vitals and nursing note reviewed. Exam conducted with a chaperone present.  Constitutional:      Appearance: Normal appearance. He is normal weight.  HENT:     Nose: Nose normal.     Mouth/Throat:     Mouth: Mucous membranes are moist.  Cardiovascular:     Pulses: Normal pulses.  Pulmonary:     Effort: Pulmonary effort is normal.  Musculoskeletal:        General: Normal range of motion.     Cervical back: Normal range of motion and neck supple.  Neurological:     General: No focal deficit present.     Mental Status: He is alert and oriented to person, place, and time.  Psychiatric:        Attention and Perception:  Attention normal.        Mood and Affect: Mood is anxious and depressed. Affect is inappropriate.        Speech: Speech is rapid and pressured and tangential.        Behavior:  Behavior is hyperactive.        Thought Content: Thought content is delusional.        Cognition and Memory: He exhibits impaired recent memory.        Judgment: Judgment is inappropriate.    Review of Systems  Psychiatric/Behavioral: The patient is nervous/anxious and has insomnia.   All other systems reviewed and are negative.   Blood pressure (!) 174/125, pulse 88, temperature 98.1 F (36.7 C), temperature source Temporal, resp. rate 18, SpO2 100 %. There is no height or weight on file to calculate BMI.  Past Psychiatric History:   Is the patient at risk to self? No  Has the patient been a risk to self in the past 6 months? No .    Has the patient been a risk to self within the distant past? No   Is the patient a risk to others? No   Has the patient been a risk to others in the past 6 months? No   Has the patient been a risk to others within the distant past? No   Past Medical History:  Past Medical History:  Diagnosis Date  . Manic behavior (HCC)   . Psychotic affective disorder (HCC) 08/30/2015   History reviewed. No pertinent surgical history.  Family History: History reviewed. No pertinent family history.  Social History:  Social History   Socioeconomic History  . Marital status: Single    Spouse name: Not on file  . Number of children: Not on file  . Years of education: Not on file  . Highest education level: Not on file  Occupational History  . Not on file  Tobacco Use  . Smoking status: Never Smoker  . Smokeless tobacco: Never Used  Vaping Use  . Vaping Use: Every day  . Substances: Nicotine, Flavoring  Substance and Sexual Activity  . Alcohol use: No  . Drug use: Not Currently    Types: Marijuana    Comment: recent usage while out of town this week  . Sexual activity: Not Currently  Other Topics Concern  . Not on file  Social History Narrative  . Not on file   Social Determinants of Health   Financial Resource Strain:   . Difficulty of Paying  Living Expenses: Not on file  Food Insecurity:   . Worried About Programme researcher, broadcasting/film/video in the Last Year: Not on file  . Ran Out of Food in the Last Year: Not on file  Transportation Needs:   . Lack of Transportation (Medical): Not on file  . Lack of Transportation (Non-Medical): Not on file  Physical Activity:   . Days of Exercise per Week: Not on file  . Minutes of Exercise per Session: Not on file  Stress:   . Feeling of Stress : Not on file  Social Connections:   . Frequency of Communication with Friends and Family: Not on file  . Frequency of Social Gatherings with Friends and Family: Not on file  . Attends Religious Services: Not on file  . Active Member of Clubs or Organizations: Not on file  . Attends Banker Meetings: Not on file  . Marital Status: Not on file  Intimate Partner Violence:   . Fear of  Current or Ex-Partner: Not on file  . Emotionally Abused: Not on file  . Physically Abused: Not on file  . Sexually Abused: Not on file    SDOH:  SDOH Screenings   Alcohol Screen:   . Last Alcohol Screening Score (AUDIT): Not on file  Depression (PHQ2-9):   . PHQ-2 Score: Not on file  Financial Resource Strain:   . Difficulty of Paying Living Expenses: Not on file  Food Insecurity:   . Worried About Programme researcher, broadcasting/film/video in the Last Year: Not on file  . Ran Out of Food in the Last Year: Not on file  Housing:   . Last Housing Risk Score: Not on file  Physical Activity:   . Days of Exercise per Week: Not on file  . Minutes of Exercise per Session: Not on file  Social Connections:   . Frequency of Communication with Friends and Family: Not on file  . Frequency of Social Gatherings with Friends and Family: Not on file  . Attends Religious Services: Not on file  . Active Member of Clubs or Organizations: Not on file  . Attends Banker Meetings: Not on file  . Marital Status: Not on file  Stress:   . Feeling of Stress : Not on file  Tobacco Use:  Low Risk   . Smoking Tobacco Use: Never Smoker  . Smokeless Tobacco Use: Never Used  Transportation Needs:   . Freight forwarder (Medical): Not on file  . Lack of Transportation (Non-Medical): Not on file    Last Labs:  Admission on 03/19/2020, Discharged on 03/19/2020  Component Date Value Ref Range Status  . Sodium 03/19/2020 139  135 - 145 mmol/L Final  . Potassium 03/19/2020 3.9  3.5 - 5.1 mmol/L Final  . Chloride 03/19/2020 102  98 - 111 mmol/L Final  . CO2 03/19/2020 28  22 - 32 mmol/L Final  . Glucose, Bld 03/19/2020 77  70 - 99 mg/dL Final   Glucose reference range applies only to samples taken after fasting for at least 8 hours.  . BUN 03/19/2020 11  6 - 20 mg/dL Final  . Creatinine, Ser 03/19/2020 1.51* 0.61 - 1.24 mg/dL Final  . Calcium 25/63/8937 9.5  8.9 - 10.3 mg/dL Final  . GFR calc non Af Amer 03/19/2020 >60  >60 mL/min Final  . GFR calc Af Amer 03/19/2020 >60  >60 mL/min Final  . Anion gap 03/19/2020 9  5 - 15 Final   Performed at [facility], [address] [place], [place] [address]  . WBC 03/19/2020 7.0  4.0 - 10.5 K/uL Final  . RBC 03/19/2020 5.06  4.22 - 5.81 MIL/uL Final  . Hemoglobin 03/19/2020 14.6  13.0 - 17.0 g/dL Final  . HCT 68/06/5725 45.3  39 - 52 % Final  . MCV 03/19/2020 89.5  80.0 - 100.0 fL Final  . MCH 03/19/2020 28.9  26.0 - 34.0 pg Final  . MCHC 03/19/2020 32.2  30.0 - 36.0 g/dL Final  . RDW 20/35/5974 12.5  11.5 - 15.5 % Final  . Platelets 03/19/2020 250  150 - 400 K/uL Final  . nRBC 03/19/2020 0.0  0.0 - 0.2 % Final   Performed at [facility], [address] [place], [place] [address]  . Troponin I (High Sensitivity) 03/19/2020 4  <18 ng/L Final   Comment: (NOTE) Elevated high sensitivity troponin I (hsTnI) values and significant  changes across serial measurements may suggest ACS but many other  chronic and acute conditions are known to elevate hsTnI results.  Refer to the "Links" section for chest pain algorithms  and additional  guidance. Performed at [facility], [address], [place], [place] [address]     Allergies: Risperidone and related  PTA Medications: (Not in a hospital admission)   Medical Decision Making   Recommendations  Based on my evaluation the patient does not appear to have an emergency medical condition. The patient is a safety risk to himself and currently is requiring psychiatric inpatient admission for stabilization and treatment. The patient is manic on this visit which safety is a major concern.  Start-Zyprexia 15 mh ODT and Depakote 500 mg at [facility]  Gillermo Murdoch, NP 07/04/20  10:38 PM

## 2020-07-04 NOTE — ED Notes (Signed)
Mom took patient's shoes and pants home.

## 2020-07-04 NOTE — BH Assessment (Signed)
Comprehensive Clinical Assessment (CCA) Note  07/04/2020 Nathan Holloway [id number]   Nathan Holloway is a 24 year old male presenting voluntarily to [facility] due to bipolar and manic behaviors. Patient is accompanied by his mother. Patient denied SI, HI and psychosis. When asked why are you here, patient referenced "not trusting". The patient speaks in a clear tone, at a high volume and fast pace. Patient appears to be manic. Patient was unable to share reasons why he came to [facility], he just looked at mother. Patients speech was rapid. Patient denied prior suicide attempts, self-harming behaviors and psychosis. Patient is not on any current psych medications. Patient denied receiving any outpatient mental health services at this time. Patient denied current alcohol and drug usage. Patient is currently a Holiday representative at [employer].   Collateral contact: Nathan Holloway, mother, present with patients consent. Mother disclosed that he went to [place] to visit some friends and began to feel unsafe during his visit. The patient called his mom, and mom discussed that his conversation "did not make sense." The patient sister went to get him from [place]. Mother became worried of patients current mental state. Mother reported that patient has been living independently in an apartment since March, but moved back in with mom about 2 weeks ago. Per mother patient has been off his meds and becoming paranoid & delusional. Mother reported the patient's father passed away in 12-06-2006, and the patient was diagnosed with Bipolar in 2012-12-05. She voiced he has had four psychiatric hospitalizations, with two being in [place] and two being here in [place]. The patient is prescribed Zyprexa 15 mg ODT at bedtime. The patient also takes Depakote 500 mg at bedrest. Mother disclosed the patient was doing great on his medications, he was attending college, and then he moved out independently. The patient stopped taking his medications. Mother voiced the patient began  to decompensate and dropped out of school and was brought into the hospital this evening.     Disposition: Elenore Paddy, NP, patient meets inpatient criteria. Tosin, AC, currently reviewing for placement at [facility] for the AM.  Chief Complaint:  Chief Complaint  Patient presents with  . Paranoid  . Medication Problem   Visit Diagnosis: Bipolar Manic   CCA Screening, Triage and Referral (STR)  Patient Reported Information How did you hear about [place]? Family/Friend  Referral name: No data recorded Referral phone number: No data recorded  Whom do you see for routine medical problems? Primary Care  Practice/Facility Name: Dr. Lysle Dingwall  Practice/Facility Phone Number: No data recorded Name of Contact: No data recorded Contact Number: No data recorded Contact Fax Number: No data recorded Prescriber Name: No data recorded Prescriber Address (if known): No data recorded  What Is the Reason for Your Visit/Call Today? No data recorded How Long Has This Been Causing You Problems? > than 6 months  What Do You Feel Would Help You the Most Today? Therapy;Medication   Have You Recently Been in Any Inpatient Treatment (Hospital/Detox/Crisis Center/28-Day Program)? No  Name/Location of Program/Hospital:No data recorded How Long Were You There? No data recorded When Were You Discharged? No data recorded  Have You Ever Received Services From [facility] Before? No  Who Do You See at [facility]? No data recorded  Have You Recently Had Any Thoughts About Hurting Yourself? No  Are You Planning to Commit Suicide/Harm Yourself At This time? No   Have you Recently Had Thoughts About Hurting Someone [name]? No  Explanation: No data recorded  Have You  Used Any Alcohol or Drugs in the Past 24 Hours? No  How Long Ago Did You Use Drugs or Alcohol? No data recorded What Did You Use and How Much? No data recorded  Do You Currently Have a Therapist/Psychiatrist? No  Name of  Therapist/Psychiatrist: No data recorded  Have You Been Recently Discharged From Any Office Practice or Programs? No  Explanation of Discharge From Practice/Program: No data recorded    CCA Screening Triage Referral Assessment Type of Contact: Face-to-Face  Is this Initial or Reassessment? No data recorded Date Telepsych consult ordered in [facility]:  No data recorded Time Telepsych consult ordered in [facility]:  No data recorded  Patient Reported Information Reviewed? Yes  Patient Left Without Being Seen? No data recorded Reason for Not Completing Assessment: No data recorded  Collateral Involvement: Nathan Holloway, mother was present with patients consent   Does Patient Have a Automotive engineer Guardian? No data recorded Name and Contact of Legal Guardian: No data recorded If Minor and Not Living with Parent(s), Who has Custody? No data recorded Is CPS involved or ever been involved? Never  Is APS involved or ever been involved? Never   Patient Determined To Be At Risk for Harm To Self or Others Based on Review of Patient Reported Information or Presenting Complaint? No  Method: No data recorded Availability of Means: No data recorded Intent: No data recorded Notification Required: No data recorded Additional Information for Danger to Others Potential: No data recorded Additional Comments for Danger to Others Potential: No data recorded Are There Guns or Other Weapons in Your Home? No data recorded Types of Guns/Weapons: No data recorded Are These Weapons Safely Secured?                            No data recorded Who Could Verify You Are Able To Have These Secured: No data recorded Do You Have any Outstanding Charges, Pending Court Dates, Parole/Probation? No data recorded Contacted To Inform of Risk of Harm To Self or Others: No data recorded  Location of Assessment: [facility] Services   Does Patient Present under Involuntary Commitment? No  IVC Papers Initial File  Date: No data recorded  [place] of Residence: [place]   Patient Currently Receiving the Following Services: Not Receiving Services   Determination of Need: Emergent (2 hours)   Options For Referral: Inpatient Hospitalization;Outpatient Therapy;Medication Management     CCA Biopsychosocial Intake/Chief Complaint:  Bipolar, manic  Current Symptoms/Problems: Bipolar, manic   Patient Reported Schizophrenia/Schizoaffective Diagnosis in Past: No   Strengths: self-awareness and seeking help  Preferences: No data recorded Abilities: No data recorded  Type of Services Patient Feels are Needed: No data recorded  Initial Clinical Notes/Concerns: No data recorded  Mental Health Symptoms Depression:  Increase/decrease in appetite;Weight gain/loss   Duration of Depressive symptoms: Greater than two weeks   Mania:  Racing thoughts;Change in energy/activity   Anxiety:   No data recorded  Psychosis:  None   Duration of Psychotic symptoms: No data recorded  Trauma:  None   Obsessions:  None   Compulsions:  None   Inattention:  None   Hyperactivity/Impulsivity:  N/A   Oppositional/Defiant Behaviors:  None   Emotional Irregularity:  None   Other Mood/Personality Symptoms:  No data recorded   Mental Status Exam Appearance and self-care  Stature:  Average   Weight:  Average weight   Clothing:  Age-appropriate   Grooming:  Normal  Cosmetic use:  Age appropriate   Posture/gait:  Normal   Motor activity:  Not Remarkable   Sensorium  Attention:  Normal   Concentration:  Normal   Orientation:  X5   Recall/memory:  Normal   Affect and Mood  Affect:  Anxious   Mood:  Anxious   Relating  Eye contact:  Normal   Facial expression:  Anxious   Attitude toward examiner:  Cooperative   Thought and Language  Speech flow: Pressured   Thought content:  Appropriate to Mood and Circumstances   Preoccupation:  None   Hallucinations:  None    Organization:  No data recorded  [employer] of Knowledge:  No data recorded  Intelligence:  Average   Abstraction:  Normal   Judgement:  Fair   Reality Testing:  No data recorded  Insight:  Poor   Decision Making:  No data recorded  Social Functioning  Social Maturity:  No data recorded  Social Judgement:  No data recorded  Stress  Stressors:  Transitions   Coping Ability:  Exhausted;Overwhelmed   Skill Deficits:  Self-control;Interpersonal   Supports:  Family     Religion:    Leisure/Recreation:    Exercise/Diet: Exercise/Diet Do You Have Any Trouble Sleeping?: Yes Explanation of Sleeping Difficulties: "pattern"   CCA Employment/Education Employment/Work Situation: Employment / Work Situation Has patient ever been in the [employer]?: No  Education: Education Is Patient Currently Attending School?: No Last Grade Completed: 14 Did Garment/textile technologist From [employer]?: Yes Did Theme park manager?: Yes Did You Attend Graduate School?: No Did You Have Any Difficulty At [employer]?: No   CCA Family/Childhood History Family and Relationship History: Family history Does patient have children?: No  Childhood History:  Childhood History Does patient have siblings?: Yes Number of Siblings: 2 Description of patient's current relationship with siblings: yes with sister, no with brother Did patient suffer any verbal/emotional/physical/sexual abuse as a child?: Yes Did patient suffer from severe childhood neglect?: No Has patient ever been sexually abused/assaulted/raped as an adolescent or adult?: No Was the patient ever a victim of a crime or a disaster?: No Witnessed domestic violence?: No Has patient been affected by domestic violence as an adult?: No  Child/Adolescent Assessment:     CCA Substance Use Alcohol/Drug Use: Alcohol / Drug Use Pain Medications: see MAR Prescriptions: see MAR Over the Counter: see MAR History of alcohol / drug  use?: No history of alcohol / drug abuse     ASAM's:  Six Dimensions of Multidimensional Assessment  Dimension 1:  Acute Intoxication and/or Withdrawal Potential:      Dimension 2:  Biomedical Conditions and Complications:      Dimension 3:  Emotional, Behavioral, or Cognitive Conditions and Complications:     Dimension 4:  Readiness to Change:     Dimension 5:  Relapse, Continued use, or Continued Problem Potential:     Dimension 6:  Recovery/Living Environment:     ASAM Severity Score:    ASAM Recommended Level of Treatment:     Substance use Disorder (SUD)    Recommendations for Services/Supports/Treatments:    DSM5 Diagnoses: There are no problems to display for this patient.   Referrals to Alternative Service(s): Referred to Alternative Service(s):   Place:   Date:   Time:    Referred to Alternative Service(s):   Place:   Date:   Time:    Referred to Alternative Service(s):   Place:   Date:   Time:  Referred to Alternative Service(s):   Place:   Date:   Time:     Nathan Holloway, [facility]

## 2020-07-04 NOTE — ED Notes (Signed)
Pt is awake. Has bizarre behavior and conversations to himself. Very questionable of medication and rules of unit. Pt is unsure of what he should be doing. Will continue to monitor for safety

## 2020-07-04 NOTE — ED Triage Notes (Signed)
Pt arrives as walk in. Per mom - guardian - pt has been living independently in an apartment since March, but moved back in with mom about 2 weeks ago. Per mom pt has been off his meds and becoming paranoid & delusional. When asked pt what's going on pt speaks very tangentially and states he doesn't know who to trust. Pt admits he's supposed to be taking medications but he does not want to. Pt A&Ox3 calm & cooperative, in no acute distress at this time.

## 2020-07-05 ENCOUNTER — Inpatient Hospital Stay (HOSPITAL_COMMUNITY)
Admission: AD | Admit: 2020-07-05 | Discharge: 2020-07-11 | DRG: 885 | Disposition: A | Discharge status: Home / Self Care | Payer: BC Managed Care – PPO | Attending: Psychiatry | Admitting: Psychiatry

## 2020-07-05 DIAGNOSIS — G4700 Insomnia, unspecified: Secondary | ICD-10-CM | POA: Diagnosis present

## 2020-07-05 DIAGNOSIS — F250 Schizoaffective disorder, bipolar type: Secondary | ICD-10-CM | POA: Diagnosis not present

## 2020-07-05 DIAGNOSIS — Z9114 Patient's other noncompliance with medication regimen: Secondary | ICD-10-CM | POA: Diagnosis not present

## 2020-07-05 DIAGNOSIS — Z7984 Long term (current) use of oral hypoglycemic drugs: Secondary | ICD-10-CM

## 2020-07-05 DIAGNOSIS — E119 Type 2 diabetes mellitus without complications: Secondary | ICD-10-CM | POA: Diagnosis present

## 2020-07-05 DIAGNOSIS — F3164 Bipolar disorder, current episode mixed, severe, with psychotic features: Secondary | ICD-10-CM | POA: Diagnosis not present

## 2020-07-05 DIAGNOSIS — Z79899 Other long term (current) drug therapy: Secondary | ICD-10-CM

## 2020-07-05 DIAGNOSIS — Z888 Allergy status to other drugs, medicaments and biological substances status: Secondary | ICD-10-CM

## 2020-07-05 DIAGNOSIS — F1290 Cannabis use, unspecified, uncomplicated: Secondary | ICD-10-CM | POA: Diagnosis present

## 2020-07-05 DIAGNOSIS — R45851 Suicidal ideations: Secondary | ICD-10-CM | POA: Diagnosis not present

## 2020-07-05 DIAGNOSIS — F3113 Bipolar disorder, current episode manic without psychotic features, severe: Secondary | ICD-10-CM | POA: Diagnosis not present

## 2020-07-05 DIAGNOSIS — F419 Anxiety disorder, unspecified: Secondary | ICD-10-CM | POA: Diagnosis not present

## 2020-07-05 DIAGNOSIS — F312 Bipolar disorder, current episode manic severe with psychotic features: Secondary | ICD-10-CM | POA: Diagnosis present

## 2020-07-05 DIAGNOSIS — F209 Schizophrenia, unspecified: Secondary | ICD-10-CM | POA: Diagnosis present

## 2020-07-05 LAB — COMPREHENSIVE METABOLIC PANEL
ALT: 27 U/L (ref 0–44)
AST: 22 U/L (ref 15–41)
Albumin: 4.7 g/dL (ref 3.5–5.0)
Alkaline Phosphatase: 90 U/L (ref 38–126)
Anion gap: 14 (ref 5–15)
BUN: 10 mg/dL (ref 6–20)
CO2: 26 mmol/L (ref 22–32)
Calcium: 10 mg/dL (ref 8.9–10.3)
Chloride: 99 mmol/L (ref 98–111)
Creatinine, Ser: 1.25 mg/dL — ABNORMAL HIGH (ref 0.61–1.24)
GFR, Estimated: >60 mL/min (ref >60)
Glucose, Bld: 99 mg/dL (ref 70–99)
Potassium: 3.3 mmol/L — ABNORMAL LOW (ref 3.5–5.1)
Sodium: 139 mmol/L (ref 135–145)
Total Bilirubin: 0.8 mg/dL (ref 0.3–1.2)
Total Protein: 7.7 g/dL (ref 6.5–8.1)

## 2020-07-05 LAB — LIPID PANEL
Cholesterol: 193 mg/dL (ref 0–200)
HDL: 76 mg/dL (ref >40)
LDL Cholesterol: 105 mg/dL — ABNORMAL HIGH (ref 0–99)
Total CHOL/HDL Ratio: 2.5 RATIO
Triglycerides: 62 mg/dL (ref <150)
VLDL: 12 mg/dL (ref 0–40)

## 2020-07-05 LAB — CBC WITH DIFFERENTIAL/PLATELET
Abs Immature Granulocytes: 0.02 10*3/uL (ref 0.00–0.07)
Basophils Absolute: 0.1 10*3/uL (ref 0.0–0.1)
Basophils Relative: 1 %
Eosinophils Absolute: 0.1 10*3/uL (ref 0.0–0.5)
Eosinophils Relative: 1 %
HCT: 51.1 % (ref 39.0–52.0)
Hemoglobin: 16.6 g/dL (ref 13.0–17.0)
Immature Granulocytes: 0 %
Lymphocytes Relative: 31 %
Lymphs Abs: 3.1 10*3/uL (ref 0.7–4.0)
MCH: 28.5 pg (ref 26.0–34.0)
MCHC: 32.5 g/dL (ref 30.0–36.0)
MCV: 87.8 fL (ref 80.0–100.0)
Monocytes Absolute: 0.8 10*3/uL (ref 0.1–1.0)
Monocytes Relative: 9 %
Neutro Abs: 5.8 10*3/uL (ref 1.7–7.7)
Neutrophils Relative %: 58 %
Platelets: 297 10*3/uL (ref 150–400)
RBC: 5.82 MIL/uL — ABNORMAL HIGH (ref 4.22–5.81)
RDW: 12.5 % (ref 11.5–15.5)
WBC: 9.8 10*3/uL (ref 4.0–10.5)
nRBC: 0 % (ref 0.0–0.2)

## 2020-07-05 LAB — ETHANOL: Alcohol, Ethyl (B): <10 mg/dL (ref <10)

## 2020-07-05 LAB — TSH: TSH: 0.958 u[IU]/mL (ref 0.350–4.500)

## 2020-07-05 LAB — HEMOGLOBIN A1C
Hgb A1c MFr Bld: 4.5 % — ABNORMAL LOW (ref 4.8–5.6)
Mean Plasma Glucose: 82.45 mg/dL

## 2020-07-05 LAB — VALPROIC ACID LEVEL: Valproic Acid Lvl: <10 ug/mL — ABNORMAL LOW (ref 50.0–100.0)

## 2020-07-05 LAB — RESPIRATORY PANEL BY RT PCR (FLU A&B, COVID)
Influenza A by PCR: NEGATIVE
Influenza B by PCR: NEGATIVE
SARS Coronavirus 2 by RT PCR: NEGATIVE

## 2020-07-05 LAB — MAGNESIUM: Magnesium: 2.2 mg/dL (ref 1.7–2.4)

## 2020-07-05 MED ORDER — OLANZAPINE 10 MG PO TBDP
ORAL_TABLET | ORAL | Status: AC
  Administered 2020-07-05: 15 mg
  Filled 2020-07-05: qty 1

## 2020-07-05 MED ORDER — OLANZAPINE 5 MG PO TBDP
ORAL_TABLET | ORAL | Status: AC
  Filled 2020-07-05: qty 1

## 2020-07-05 MED ORDER — POTASSIUM CHLORIDE CRYS ER 20 MEQ PO TBCR
20.0000 meq | EXTENDED_RELEASE_TABLET | Freq: Two times a day (BID) | ORAL | Status: DC
  Administered 2020-07-05: 20 meq via ORAL
  Filled 2020-07-05: qty 1

## 2020-07-05 MED ORDER — TRAZODONE HCL 50 MG PO TABS
50.0000 mg | ORAL_TABLET | Freq: Every evening | ORAL | Status: DC | PRN
  Administered 2020-07-05: 50 mg via ORAL
  Filled 2020-07-05: qty 1

## 2020-07-05 MED ORDER — ALUM & MAG HYDROXIDE-SIMETH 200-200-20 MG/5ML PO SUSP: 30.0000 mL | ORAL | Status: DC | PRN

## 2020-07-05 MED ORDER — OLANZAPINE 5 MG PO TBDP
15.0000 mg | ORAL_TABLET | Freq: Every day | ORAL | Status: DC
  Administered 2020-07-05: 15 mg via ORAL
  Filled 2020-07-05 (×2): qty 1

## 2020-07-05 MED ORDER — ACETAMINOPHEN 325 MG PO TABS: 650.0000 mg | ORAL_TABLET | Freq: Four times a day (QID) | ORAL | Status: DC | PRN

## 2020-07-05 NOTE — ED Notes (Signed)
Pt had awaken for 10-15 minutes. Pt fell back asleep w/o any c/o of pain or distress. Will continue to monitor pt for safety

## 2020-07-05 NOTE — ED Notes (Addendum)
Message left with mom/guardian Natasha Mead - regarding patient's transfer to [facility]. Mother is aware that it is happening but not when.

## 2020-07-05 NOTE — ED Notes (Addendum)
Pt is alert and oriented on the unit. When asked about his night's rest, pt stated that he slept good, "I haven't slept good like that in a while." Pt. was ambulatory on the unit with no issues and denied SI/HI and AVH and any pain. Education, support and encouragement provided. No reactions/side effects to medicine noted. Pt denies any concerns at this time, and verbally contracts for safety. Pt remains safe on the unit.

## 2020-07-05 NOTE — ED Notes (Signed)
Received call from patient's mother/guardian regarding medication compliance. Reassured her that pt had taken meds with lots of encouragement and was currently sleeping. Mom expressed gratitude & stated she will talk to provider/counselor tomorrow when they call regarding dispo.

## 2020-07-05 NOTE — ED Notes (Signed)
Lunch given.

## 2020-07-05 NOTE — ED Notes (Signed)
Pt asleep with even and unlabored respirations. No distress or discomfort noted. Pt remains safe on the unit. Will continue to monitor. 

## 2020-07-05 NOTE — ED Provider Notes (Signed)
Patient accepted to [facility] unit

## 2020-07-05 NOTE — ED Notes (Signed)
Pt calm and cooperative. Pt in bathroom@this  time. Will continue to monitor pt for safety

## 2020-07-06 ENCOUNTER — Other Ambulatory Visit: Payer: Self-pay

## 2020-07-06 ENCOUNTER — Encounter (HOSPITAL_COMMUNITY): Payer: Self-pay | Admitting: Behavioral Health

## 2020-07-06 DIAGNOSIS — F3113 Bipolar disorder, current episode manic without psychotic features, severe: Secondary | ICD-10-CM

## 2020-07-06 LAB — COMPREHENSIVE METABOLIC PANEL
ALT: 25 U/L (ref 0–44)
AST: 21 U/L (ref 15–41)
Albumin: 4.3 g/dL (ref 3.5–5.0)
Alkaline Phosphatase: 76 U/L (ref 38–126)
Anion gap: 9 (ref 5–15)
BUN: 16 mg/dL (ref 6–20)
CO2: 27 mmol/L (ref 22–32)
Calcium: 9.4 mg/dL (ref 8.9–10.3)
Chloride: 102 mmol/L (ref 98–111)
Creatinine, Ser: 1.27 mg/dL — ABNORMAL HIGH (ref 0.61–1.24)
GFR, Estimated: >60 mL/min (ref >60)
Glucose, Bld: 77 mg/dL (ref 70–99)
Potassium: 4.4 mmol/L (ref 3.5–5.1)
Sodium: 138 mmol/L (ref 135–145)
Total Bilirubin: 0.8 mg/dL (ref 0.3–1.2)
Total Protein: 7.2 g/dL (ref 6.5–8.1)

## 2020-07-06 LAB — PROLACTIN: Prolactin: 3.7 ng/mL — ABNORMAL LOW (ref 4.0–15.2)

## 2020-07-06 MED ORDER — ENSURE ENLIVE PO LIQD
237.0000 mL | ORAL | Status: DC
  Administered 2020-07-07 – 2020-07-08 (×2): 237 mL via ORAL
  Filled 2020-07-06 (×4): qty 237

## 2020-07-06 MED ORDER — BOOST / RESOURCE BREEZE PO LIQD CUSTOM
1.0000 | Freq: Two times a day (BID) | ORAL | Status: DC
  Administered 2020-07-06 – 2020-07-08 (×8): 1 via ORAL
  Filled 2020-07-06 (×9): qty 1

## 2020-07-06 MED ORDER — LURASIDONE HCL 40 MG PO TABS
40.0000 mg | ORAL_TABLET | Freq: Every day | ORAL | Status: DC
  Administered 2020-07-06 – 2020-07-07 (×2): 40 mg via ORAL
  Filled 2020-07-06 (×4): qty 1

## 2020-07-06 MED ORDER — ENSURE ENLIVE PO LIQD
237.0000 mL | Freq: Two times a day (BID) | ORAL | Status: DC
  Filled 2020-07-06 (×3): qty 237

## 2020-07-06 MED ORDER — OLANZAPINE 5 MG PO TBDP
5.0000 mg | ORAL_TABLET | Freq: Every day | ORAL | Status: DC
  Administered 2020-07-06: 5 mg via ORAL
  Filled 2020-07-06 (×2): qty 1

## 2020-07-06 MED ORDER — DIVALPROEX SODIUM 500 MG PO DR TAB
500.0000 mg | DELAYED_RELEASE_TABLET | Freq: Two times a day (BID) | ORAL | Status: DC
  Administered 2020-07-06 – 2020-07-08 (×5): 500 mg via ORAL
  Filled 2020-07-06 (×8): qty 1

## 2020-07-06 NOTE — Progress Notes (Signed)
NUTRITION ASSESSMENT  Pt identified as at risk on the Malnutrition Screen Tool  INTERVENTION: 1. Supplements: Boost Breeze po BID, each supplement provides 250 kcal and 9 grams of protein 2. Ensure Enlive po daily, each supplement provides 350 kcal and 20 grams of protein   NUTRITION DIAGNOSIS: Unintentional weight loss related to sub-optimal intake as evidenced by pt report.   Goal: Pt to meet >/= 90% of their estimated nutrition needs.  Monitor:  PO intake  Assessment:  Pt admitted for bipolar disorder with manic behaviors. Pt reports poor PO intake PTA. Per weight records, pt has lost 32 lbs since 7/29 (18% wt loss x 3.5 months, significant for time frame). Will add Ensure supplements.   Height: Ht Readings from Last 1 Encounters:  07/05/20 5\' 8"  (1.727 m)    Weight: Wt Readings from Last 1 Encounters:  07/05/20 64.4 kg    Weight Hx: Wt Readings from Last 10 Encounters:  07/05/20 64.4 kg  03/19/20 79.4 kg  08/21/16 83 kg    BMI:  Body mass index is 21.59 kg/m. Pt meets criteria for normal based on current BMI.  Estimated Nutritional Needs: Kcal: 25-30 kcal/kg Protein: > 1 gram protein/kg Fluid: 1 ml/kcal  Diet Order:  Diet Order    None     Pt is also offered choice of unit snacks mid-morning and mid-afternoon.  Pt is eating as desired.   Lab results and medications reviewed.   08/23/16, MS, RD, LDN Inpatient Clinical Dietitian Contact information available via Amion

## 2020-07-06 NOTE — Progress Notes (Signed)
Pt is alert and oriented to person, place, time and situation. Pt is calm, cooperative, denies suicidal and homicidal ideation, denies hallucinations, denies feelings of depression and anxiety. Pt states his mood is "really good." Pt attends groups, appetite is good, reports he slept well. Pt reports he had hallucinations in the past, but that the medications have helped and no longer has them today. No distress noted, none reported. Will continue to monitor pt per Q15 minute face checks and monitor for safety and progress.

## 2020-07-06 NOTE — Progress Notes (Signed)
Recreation Therapy Notes  Date: 11.15.21 Time: 1000 Location: 500 Hall Dayroom  Group Topic: Communication  Goal Area(s) Addresses:  Patient will effectively communicate with peers in group.  Patient will verbalize benefit of healthy communication. Patient will verbalize positive effect of healthy communication on post d/c goals.  Patient will identify communication techniques that made activity effective for group.   Behavioral Response: Engaged  Intervention: Geometrical shapes, pencils, blank paper  Activity: Geometrical Drawings.  One patient would come to the front of the group and describe a geometrical shapes drawing as detailed as possible.  The remaining group members would draw the picture how it is described to them.  The group members could only as the presenter to repeat themselves, they could not ask any detailed questions.  The process would continue until all 3 pictures have been presented.  Education: Communication, Discharge Planning  Education Outcome: Acknowledges understanding/In group clarification offered/Needs additional education.   Clinical Observations/Feedback:  Pt was engaged and attentive.  Pt expressed the tone in which you speak to people can affect communication.  Pt peers felt he did good describing the picture but could have done better telling when things were supposed to be slanted.      Caroll Rancher, LRT/CTRS         Caroll Rancher A 07/06/2020 11:44 AM

## 2020-07-06 NOTE — Progress Notes (Signed)
Recreation Therapy Notes  INPATIENT RECREATION THERAPY ASSESSMENT  Patient Details Name: Nathan Holloway MRN: [id number] DOB: [birth year] Today's Date: 07/06/2020       Information Obtained From: Patient  Able to Participate in Assessment/Interview: Yes  Patient Presentation: Alert  Reason for Admission (Per Patient): Other (Comments) ("having an episode")  Patient Stressors:  (None)  Coping Skills:   Sports, TV, Music, Exercise, Meditate, Deep Breathing, Talk, Avoidance, Read, Dance, Hot Bath/Shower  Leisure Interests (2+):  Games - Video games, Music - Listen, Music - Write music, Music - Other (Comment) (Dance)  Frequency of Recreation/Participation: Other (Comment) (Daily)  Awareness of Community Resources:  Yes  Community Resources:  Restaurants, [place], [place], [place]  Current Use: Yes  If no, Barriers?:    Expressed Interest in [employer] Information: No  [employer] of Residence:  Engineer, technical sales  Patient Main Form of Transportation: Set designer  Patient Strengths:  Engineer, structural; Teaching  Patient Identified Areas of Improvement:  Slow down speech; Money Management  Patient Goal for Hospitalization:  "figure out medications for me to get a better read on my diagnosis"  Current SI (including self-harm):  No  Current HI:  No  Current AVH: No  Staff Intervention Plan: Group Attendance, Collaborate with Interdisciplinary Treatment Team  Consent to Intern Participation: N/A    Caroll Rancher, LRT/CTRS  Caroll Rancher A 07/06/2020, 12:09 PM

## 2020-07-06 NOTE — [facility] Group Notes (Signed)
[facility] LCSW Group Therapy  07/06/2020 2:18 PM  Type of Therapy:  Group Therapy  Participation Level:  Active  Participation Quality:  Sharing  Affect:  Appropriate  Cognitive:  Disorganized  Insight:  Developing/Improving  Engagement in Therapy:  Engaged  Modes of Intervention:  Activity and Discussion  Summary of Progress/Problems: Patient participated and was engaged in group therapy. Patient often was disorganized, however he shared that the majority of his core beliefs are positive.  Jannice Beitzel A Jhovanny Guinta 07/06/2020, 2:18 PM

## 2020-07-06 NOTE — H&P (Signed)
Psychiatric Admission Assessment Adult  Patient Identification: Nathan Holloway MRN:  [id number] Date of Evaluation:  07/06/2020 Chief Complaint:  Schizophrenia (HCC) [F20.9] Bipolar disorder with severe mania (HCC) [F31.13] Principal Diagnosis: <principal problem not specified> Diagnosis:  Active Problems:   Schizophrenia (HCC)   Bipolar disorder with severe mania (HCC)  History of Present Illness:  Patient is seen and examined.  Patient is a 24 year old male with a past psychiatric history significant for bipolar disorder who presented to the [facility] on 07/04/2020 with his mother.  The patient has a history of bipolar disorder and was recently noncompliant with medications.  He stated that he had asked his nurse practitioner several months ago to wean him off the medications.  He had recently gone to visit friends in [place], and when he came back appeared to be psychotic.  He denied suicidal ideation, homicidal ideation or auditory or visual hallucinations.  He was somewhat paranoid.  He did have pressured speech and it was somewhat rapid.  He was admitted to the hospital for evaluation and stabilization.  He has had 4 previous psychiatric hospitalizations.  He had 2 hospitalizations in [place] and 2 in [place].  He had previously been prescribed Zyprexa 15 mg p.o. nightly and Depakote DR 500 mg p.o. nightly.  His mother revealed that he had been doing very well on his medications, and was attending college.  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  He stated his main concern is weight gain with the Zyprexa, and he feels as though "I am in the best shape of my life".  He does not want to gain weight on medications.  He previously was treated with Risperdal, and stated he was allergic to it and that "it almost killed me".  He currently denies auditory or visual hallucinations.  He denied suicidal or homicidal  ideation.  Associated Signs/Symptoms: Depression Symptoms:  anhedonia, insomnia, psychomotor agitation, fatigue, feelings of worthlessness/guilt, difficulty concentrating, hopelessness, suicidal thoughts without plan, anxiety, loss of energy/fatigue, disturbed sleep, weight loss, Duration of Depression Symptoms: Greater than two weeks  (Hypo) Manic Symptoms:  Delusions, Elevated Mood, Impulsivity, Labiality of Mood, Anxiety Symptoms:  Excessive Worry, Psychotic Symptoms:  Delusions, Paranoia, Duration of Psychotic Symptoms: No data recorded PTSD Symptoms: Negative Total Time spent with patient: 45 minutes  Past Psychiatric History: Patient admitted to 4 previous psychiatric admissions.  He stated he has been diagnosed with bipolar disorder, schizoaffective disorder as well as schizophrenia in the past.  He had previously used marijuana, but denied any currently.  He also had used a vape device to inhale CBD.  He admitted to tobacco use and social alcohol.  Is the patient at risk to self? Yes.    Has the patient been a risk to self in the past 6 months? No.  Has the patient been a risk to self within the distant past? Yes.    Is the patient a risk to others? No.  Has the patient been a risk to others in the past 6 months? No.  Has the patient been a risk to others within the distant past? No.   Prior Inpatient Therapy:   Prior Outpatient Therapy:    Alcohol Screening: 1. How often do you have a drink containing alcohol?: Never 2. How many drinks containing alcohol do you have on a typical day when you are drinking?: 1 or 2 3. How often do you have six or more  drinks on one occasion?: Never AUDIT-C Score: 0 4. How often during the last year have you found that you were not able to stop drinking once you had started?: Never 5. How often during the last year have you failed to do what was normally expected from you because of drinking?: Never 6. How often during the last  year have you needed a first drink in the morning to get yourself going after a heavy drinking session?: Never 7. How often during the last year have you had a feeling of guilt of remorse after drinking?: Never 8. How often during the last year have you been unable to remember what happened the night before because you had been drinking?: Never 9. Have you or someone else been injured as a result of your drinking?: No 10. Has a relative or friend or a doctor or another health worker been concerned about your drinking or suggested you cut down?: No Alcohol Use Disorder Identification Test Final Score (AUDIT): 0 Substance Abuse History in the last 12 months:  Yes.   Consequences of Substance Abuse: Negative Previous Psychotropic Medications: Yes  Psychological Evaluations: Yes  Past Medical History:  Past Medical History:  Diagnosis Date  . Manic behavior (HCC)   . Psychotic affective disorder (HCC) 08/30/2015   History reviewed. No pertinent surgical history. Family History: History reviewed. No pertinent family history. Family Psychiatric  History: A family member with anxiety, but no one with bipolar disorder and or any thought disorder. Tobacco Screening:   Social History:  Social History   Substance and Sexual Activity  Alcohol Use No     Social History   Substance and Sexual Activity  Drug Use Not Currently  . Types: Marijuana   Comment: recent usage while out of town this week    Additional Social History:                           Allergies:   Allergies  Allergen Reactions  . Risperidone And Related Other (See Comments)    Diastatic   Lab Results:  Results for orders placed or performed during the hospital encounter of 07/05/20 (from the past 48 hour(s))  Comprehensive metabolic panel     Status: Abnormal   Collection Time: 07/06/20  6:21 AM  Result Value Ref Range   Sodium 138 135 - 145 mmol/L   Potassium 4.4 3.5 - 5.1 mmol/L   Chloride 102 98 - 111  mmol/L   CO2 27 22 - 32 mmol/L   Glucose, Bld 77 70 - 99 mg/dL    Comment: Glucose reference range applies only to samples taken after fasting for at least 8 hours.   BUN 16 6 - 20 mg/dL   Creatinine, Ser 9.98 (H) 0.61 - 1.24 mg/dL   Calcium 9.4 8.9 - 33.8 mg/dL   Total Protein 7.2 6.5 - 8.1 g/dL   Albumin 4.3 3.5 - 5.0 g/dL   AST 21 15 - 41 U/L   ALT 25 0 - 44 U/L   Alkaline Phosphatase 76 38 - 126 U/L   Total Bilirubin 0.8 0.3 - 1.2 mg/dL   GFR, Estimated >25 >05 mL/min    Comment: (NOTE) Calculated using the CKD-EPI Creatinine Equation (2021)    Anion gap 9 5 - 15    Comment: Performed at [facility], [address] [place], [place] [address]    Blood Alcohol level:  Lab Results  Component Value Date  ETH <10 07/04/2020   ETH <5 08/21/2016    Metabolic Disorder Labs:  Lab Results  Component Value Date   HGBA1C 4.5 (L) 07/04/2020   MPG 82.45 07/04/2020   No results found for: PROLACTIN Lab Results  Component Value Date   CHOL 193 07/04/2020   TRIG 62 07/04/2020   HDL 76 07/04/2020   CHOLHDL 2.5 07/04/2020   VLDL 12 07/04/2020   LDLCALC 105 (H) 07/04/2020    Current Medications: Current Facility-Administered Medications  Medication Dose Route Frequency Provider Last Rate Last Admin  . acetaminophen (TYLENOL) tablet 650 mg  650 mg Oral Q6H PRN Oneta Rack, NP      . alum & mag hydroxide-simeth (MAALOX/MYLANTA) 200-200-20 MG/5ML suspension 30 mL  30 mL Oral Q4H PRN Oneta Rack, NP      . divalproex (DEPAKOTE) DR tablet 500 mg  500 mg Oral Q12H Antonieta Pert, MD   500 mg at 07/06/20 0818  . feeding supplement (BOOST / RESOURCE BREEZE) liquid 1 Container  1 Container Oral BID BM Antonieta Pert, MD   1 Container at 07/06/20 1107  . feeding supplement (ENSURE ENLIVE / ENSURE PLUS) liquid 237 mL  237 mL Oral Q24H Antonieta Pert, MD      . lurasidone (LATUDA) tablet 40 mg  40 mg Oral Q breakfast Antonieta Pert, MD   40 mg at  07/06/20 1107  . OLANZapine zydis (ZYPREXA) disintegrating tablet 5 mg  5 mg Oral QHS Antonieta Pert, MD      . traZODone (DESYREL) tablet 50 mg  50 mg Oral QHS PRN Oneta Rack, NP   50 mg at 07/05/20 2128   PTA Medications: No medications prior to admission.    Musculoskeletal: Strength & Muscle Tone: within normal limits Gait & Station: normal Patient leans: N/A  Psychiatric Specialty Exam: Physical Exam Vitals and nursing note reviewed.  Constitutional:      Appearance: Normal appearance.  HENT:     Head: Normocephalic and atraumatic.  Pulmonary:     Effort: Pulmonary effort is normal.  Neurological:     General: No focal deficit present.     Mental Status: He is alert and oriented to person, place, and time.     Review of Systems  Blood pressure 139/85, pulse 63, temperature (!) 97.5 F (36.4 C), temperature source Oral, resp. rate 18, height 5\' 8"  (1.727 m), weight 64.4 kg, SpO2 98 %.Body mass index is 21.59 kg/m.  General Appearance: Disheveled  Eye Contact:  Fair  Speech:  Pressured  Volume:  Normal  Mood:  Dysphoric  Affect:  Congruent  Thought Process:  Goal Directed and Descriptions of Associations: Circumstantial  Orientation:  Negative  Thought Content:  Delusions and Paranoid Ideation  Suicidal Thoughts:  No  Homicidal Thoughts:  No  Memory:  Immediate;   Fair Recent;   Fair Remote;   Fair  Judgement:  Fair  Insight:  Fair  Psychomotor Activity:  Increased  Concentration:  Concentration: Good  Recall:  Good  Fund of Knowledge:  Good  Language:  Good  Akathisia:  Negative  Handed:  Right  AIMS (if indicated):     Assets:  Desire for Improvement Housing Resilience Social Support Talents/Skills  ADL's:  Intact  Cognition:  WNL  Sleep:  Number of Hours: 6    Treatment Plan Summary: Daily contact with patient to assess and evaluate symptoms and progress in treatment, Medication management and Plan : Patient is seen and  examined.   Patient is a 24 year old male with the above-stated past psychiatric history who was admitted secondary to noncompliance with his medications and developing manic symptoms as well as paranoia.  He will be admitted to the hospital.  He will be integrated in the milieu.  He will be encouraged to attend groups.  He was already written to restart the Zyprexa, but he is very concerned about weight gain.  We discussed his medication options.  He would like to try Latuda.  We will start him on 40 mg p.o. twice daily.  We will titrate that during the course hospitalization.  I told him that we would restart his Depakote.  We will do Depakote DR 500 mg p.o. twice daily today, and then switch that to 500 mg p.o. nightly tomorrow after the loading dose is done today.  He will also have available hydroxyzine for anxiety and trazodone for sleep.  Review of his laboratories revealed essentially normal electrolytes except for a mildly elevated creatinine at 1.27.  Liver function enzymes were normal.  His lipid panel was normal.  His CBC was essentially normal.  Valproic acid level was less than 10.  Hemoglobin A1c was 4.5.  TSH was 0.958.  Drug screen was completely negative.  Blood alcohol was less than 10.  We will order an EKG given the antipsychotic medications.  His vital signs are stable, he is afebrile.  Observation Level/Precautions:  15 minute checks  Laboratory:  Chemistry Profile  Psychotherapy:    Medications:    Consultations:    Discharge Concerns:    Estimated LOS:  Other:     Physician Treatment Plan for Primary Diagnosis: <principal problem not specified> Long Term Goal(s): Improvement in symptoms so as ready for discharge  Short Term Goals: Ability to identify changes in lifestyle to reduce recurrence of condition will improve, Ability to verbalize feelings will improve, Ability to demonstrate self-control will improve, Ability to identify and develop effective coping behaviors will improve, Ability  to maintain clinical measurements within normal limits will improve, Compliance with prescribed medications will improve and Ability to identify triggers associated with substance abuse/mental health issues will improve  Physician Treatment Plan for Secondary Diagnosis: Active Problems:   Schizophrenia (HCC)   Bipolar disorder with severe mania (HCC)  Long Term Goal(s): Improvement in symptoms so as ready for discharge  Short Term Goals: Ability to identify changes in lifestyle to reduce recurrence of condition will improve, Ability to verbalize feelings will improve, Ability to demonstrate self-control will improve, Ability to identify and develop effective coping behaviors will improve, Ability to maintain clinical measurements within normal limits will improve, Compliance with prescribed medications will improve and Ability to identify triggers associated with substance abuse/mental health issues will improve  I certify that inpatient services furnished can reasonably be expected to improve the patient's condition.    Antonieta PertGreg Lawson Asti Mackley, MD 11/15/202111:33 AM

## 2020-07-06 NOTE — [facility] Group Notes (Signed)
[facility] Group Notes:  (Nursing/MHT/Case Management/Adjunct)  Date:  07/06/2020  Time:  9:43 AM  Type of Therapy:  Group Therapy  Participation Level:  Active  Participation Quality:  Appropriate  Affect:  Appropriate  Cognitive:  Alert and Appropriate  Insight:  Appropriate and Good  Engagement in Group:  Engaged  Modes of Intervention:  Orientation  Summary of Progress/Problems: His goal for today is to be able to take it one day at a time, get back on his medications since he has been off of them for a while due to stressors in his life.   Nathan Holloway [name] 07/06/2020, 9:43 AM

## 2020-07-06 NOTE — Progress Notes (Signed)
  Admission DAR NOTE:  Pt transferred from [facility]. under voluntary status. Alert and oriented x 3 stating that his sister and mum send him to the hospital "they think I need help" Pt observed with sullen affect, logical, pressured speech and fair eye contact, Paranoia thought content and flight of ideas thought process. 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 Stating that he has not been eating well . Verbally contracted for safety. Patient denied having any stressor Denies SI/HI/A/VH.   Emotional support and availability offered to Patient as needed. Skin assessment done and belongings searched per protocol. . Items deemed contraband secured in locker. Unit orientation and routine discussed, Care Plan reviewed as well and Patient will need reinforcemen. Fluids and food offered, tolerated well. Q15 minutes safety checks initiated without self harm gestures.
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
## 2020-07-06 NOTE — Tx Team (Signed)
Interdisciplinary Treatment and Diagnostic Plan Update  07/06/2020 Time of Session: 9:05am Nathan Holloway MRN: [id number]  Principal Diagnosis: <principal problem not specified>  Secondary Diagnoses: Active Problems:   Schizophrenia ([place])   Current Medications:  Current Facility-Administered Medications  Medication Dose Route Frequency Provider Last Rate Last Admin  . acetaminophen (TYLENOL) tablet 650 mg  650 mg Oral Q6H PRN Derrill Center, NP      . alum & mag hydroxide-simeth (MAALOX/MYLANTA) 200-200-20 MG/5ML suspension 30 mL  30 mL Oral Q4H PRN Derrill Center, NP      . divalproex (DEPAKOTE) DR tablet 500 mg  500 mg Oral Q12H Sharma Covert, MD   500 mg at 07/06/20 0818  . feeding supplement (BOOST / RESOURCE BREEZE) liquid 1 Container  1 Container Oral BID BM Sharma Covert, MD      . OLANZapine zydis (ZYPREXA) disintegrating tablet 15 mg  15 mg Oral QHS Derrill Center, NP   15 mg at 07/05/20 2102  . traZODone (DESYREL) tablet 50 mg  50 mg Oral QHS PRN Derrill Center, NP   50 mg at 07/05/20 2128   PTA Medications: No medications prior to admission.    Patient Stressors: Health problems Medication change or noncompliance  Patient Strengths: Ability for insight Child psychotherapist Supportive family/friends  Treatment Modalities: Medication Management, Group therapy, Case management,  1 to 1 session with clinician, Psychoeducation, Recreational therapy.   Physician Treatment Plan for Primary Diagnosis: <principal problem not specified> Long Term Goal(s):     Short Term Goals:    Medication Management: Evaluate patient's response, side effects, and tolerance of medication regimen.  Therapeutic Interventions: 1 to 1 sessions, Unit Group sessions and Medication administration.  Evaluation of Outcomes: Not Met  Physician Treatment Plan for Secondary Diagnosis: Active Problems:   Schizophrenia ([place])  Long Term Goal(s):     Short Term  Goals:       Medication Management: Evaluate patient's response, side effects, and tolerance of medication regimen.  Therapeutic Interventions: 1 to 1 sessions, Unit Group sessions and Medication administration.  Evaluation of Outcomes: Not Met   RN Treatment Plan for Primary Diagnosis: <principal problem not specified> Long Term Goal(s): Knowledge of disease and therapeutic regimen to maintain health will improve  Short Term Goals: Ability to remain free from injury will improve, Ability to demonstrate self-control, Ability to participate in decision making will improve, Ability to identify and develop effective coping behaviors will improve and Compliance with prescribed medications will improve  Medication Management: RN will administer medications as ordered by provider, will assess and evaluate patient's response and provide education to patient for prescribed medication. RN will report any adverse and/or side effects to prescribing provider.  Therapeutic Interventions: 1 on 1 counseling sessions, Psychoeducation, Medication administration, Evaluate responses to treatment, Monitor vital signs and CBGs as ordered, Perform/monitor CIWA, COWS, AIMS and Fall Risk screenings as ordered, Perform wound care treatments as ordered.  Evaluation of Outcomes: Not Met   LCSW Treatment Plan for Primary Diagnosis: <principal problem not specified> Long Term Goal(s): Safe transition to appropriate next level of care at discharge, Engage patient in therapeutic group addressing interpersonal concerns.  Short Term Goals: Engage patient in aftercare planning with referrals and resources, Increase social support, Increase emotional regulation, Identify triggers associated with mental health/substance abuse issues and Increase skills for wellness and recovery  Therapeutic Interventions: Assess for all discharge needs, 1 to 1 time with Social worker, Explore available resources and support systems, Assess  for adequacy in community support network, Educate family and significant other(s) on suicide prevention, Complete Psychosocial Assessment, Interpersonal group therapy.  Evaluation of Outcomes: Not Met   Progress in Treatment: Attending groups: Yes. Participating in groups: Yes. Taking medication as prescribed: Yes. Toleration medication: Yes. Family/Significant other contact made: No, will contact:  if cosent is given Patient understands diagnosis: Yes. Discussing patient identified problems/goals with staff: Yes. Medical problems stabilized or resolved: Yes. Denies suicidal/homicidal ideation: Yes. Issues/concerns per patient self-inventory: No.   New problem(s) identified: No, Describe:  none  New Short Term/Long Term Goal(s): medication stabilization, elimination of SI thoughts, development of comprehensive mental wellness plan.   Patient Goals: "To slow down, keep good energy, clear my mind and keep balance"   Discharge Plan or Barriers: Patient recently admitted. CSW will continue to follow and assess for appropriate referrals and possible discharge planning.   Reason for Continuation of Hospitalization: Mania Medication stabilization  Estimated Length of Stay: 3-5 days  Attendees: Patient: Nathan Holloway 07/06/2020   Physician: Myles Lipps, MD 07/06/2020   Nursing:  07/06/2020   RN Care Manager: 07/06/2020   Social Worker: Darletta Moll, LCSW 07/06/2020   Recreational Therapist:  07/06/2020   Other:  07/06/2020  Other:  07/06/2020  Other: 07/06/2020       Scribe for Treatment Team: Vassie Moselle, LCSW 07/06/2020 10:09 AM

## 2020-07-06 NOTE — Tx Team (Signed)
Initial Treatment Plan 07/06/2020 5:24 AM [name] KGM:[id number]    PATIENT STRESSORS: Health problems Medication change or noncompliance   PATIENT STRENGTHS: Ability for insight Licensed conveyancer Supportive family/friends   PATIENT IDENTIFIED PROBLEMS: Medication non compliance  Grieving                   DISCHARGE CRITERIA:  Ability to meet basic life and health needs Motivation to continue treatment in a less acute level of care Verbal commitment to aftercare and medication compliance  PRELIMINARY DISCHARGE PLAN: Outpatient therapy Return to previous living arrangement  PATIENT/FAMILY INVOLVEMENT: This treatment plan has been presented to and reviewed with the patient, Shaquel Chavous.  The patient and family have been given the opportunity to ask questions and make suggestions.  Margarita Rana, RN 07/06/2020, 5:24 AM

## 2020-07-06 NOTE — [facility] Suicide Risk Assessment (Signed)
[facility] Admission Suicide Risk Assessment   Nursing information obtained from:  Patient Demographic factors:  Male, Adolescent or young adult Current Mental Status:  NA Loss Factors:  NA Historical Factors:  Impulsivity Risk Reduction Factors:  Living with another person, especially a relative, Positive social support  Total Time spent with patient: 30 minutes Principal Problem: <principal problem not specified> Diagnosis:  Active Problems:   Schizophrenia (HCC)  Subjective Data: Patient is seen and examined.  Patient is a 24 year old male with a past psychiatric history significant for bipolar disorder who [facility] to the [facility] on [facility]/04/[facility] with his mother.  The patient has a history of bipolar disorder and was recently noncompliant with medications.  He stated that he had asked his nurse practitioner several months ago to wean him off the medications.  He had recently gone to visit friends in [place], and when he came back appeared to be psychotic.  He denied suicidal ideation, homicidal ideation or auditory or visual hallucinations.  He was somewhat paranoid.  He did have pressured speech and it was somewhat rapid.  He was admitted to the hospital for evaluation and stabilization.  He has had 4 previous psychiatric hospitalizations.  He had 2 hospitalizations in [place] and 2 in [place].  He had previously been prescribed Zyprexa 15 mg p.o. nightly and Depakote DR 500 mg p.o. nightly.  His mother revealed that he had been doing very well on his medications, and was attending college.  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  He stated his main concern is weight gain with the Zyprexa, and he feels as though "I am in the best shape of my life".  He does not want to gain weight on medications.  He previously was treated with Risperdal, and stated he was allergic to it and that "it almost killed me".  He  currently denies auditory or visual hallucinations.  He denied suicidal or homicidal ideation.  Continued Clinical Symptoms:  Alcohol Use Disorder Identification Test Final Score (AUDIT): 0 The "Alcohol Use Disorders Identification Test", Guidelines for Use in Primary Care, Second Edition.  World Science writer [facility]). Score between 0-7:  no or low risk or alcohol related problems. Score between 8-15:  moderate risk of alcohol related problems. Score between 16-19:  high risk of alcohol related problems. Score 20 or above:  warrants further diagnostic evaluation for alcohol dependence and treatment.   CLINICAL FACTORS:   Bipolar Disorder:   Mixed State Currently Psychotic   Musculoskeletal: Strength & Muscle Tone: within normal limits Gait & Station: normal Patient leans: N/A  Psychiatric Specialty Exam: Physical Exam Vitals and nursing note reviewed.  Constitutional:      Appearance: Normal appearance.  HENT:     Head: Normocephalic and atraumatic.  Pulmonary:     Effort: Pulmonary effort is normal.  Neurological:     General: No focal deficit present.     Mental Status: He is alert and oriented to person, place, and time.     Review of Systems  Blood pressure 139/85, pulse 63, temperature (!) 97.5 F (36.4 C), temperature source Oral, resp. rate 18, height 5\' 8"  (1.727 m), weight 64.4 kg, SpO2 98 %.Body mass index is 21.59 kg/m.  General Appearance: Disheveled  Eye Contact:  Fair  Speech:  Pressured  Volume:  Normal  Mood:  Anxious  Affect:  Congruent  Thought Process:  Coherent and Descriptions of Associations: Circumstantial  Orientation:  Full (Time, Place, and Person)  Thought Content:  Logical  Suicidal Thoughts:  No  Homicidal Thoughts:  No  Memory:  Immediate;   Fair Recent;   Fair Remote;   Fair  Judgement:  Intact  Insight:  Fair  Psychomotor Activity:  Increased  Concentration:  Concentration: Fair and Attention Span: Fair  Recall:  [employer]  of Knowledge:  Fair  Language:  Good  Akathisia:  Negative  Handed:  Right  AIMS (if indicated):     Assets:  Desire for Improvement Housing Resilience Social Support  ADL's:  Intact  Cognition:  WNL  Sleep:  Number of Hours: 6      COGNITIVE FEATURES THAT CONTRIBUTE TO RISK:  None    SUICIDE RISK:   Mild:  Suicidal ideation of limited frequency, intensity, duration, and specificity.  There are no identifiable plans, no associated intent, mild dysphoria and related symptoms, good self-control (both objective and subjective assessment), few other risk factors, and identifiable protective factors, including available and accessible social support.  PLAN OF CARE: Patient is seen and examined.  Patient is a 24 year old male with the above-stated past psychiatric history who was admitted secondary to noncompliance with his medications and developing manic symptoms as well as paranoia.  He will be admitted to the hospital.  He will be integrated in the milieu.  He will be encouraged to attend groups.  He was already written to restart the Zyprexa, but he is very concerned about weight gain.  We discussed his medication options.  He would like to try Latuda.  We will start him on 40 mg p.o. twice daily.  We will titrate that during the course hospitalization.  I told him that we would restart his Depakote.  We will do Depakote DR 500 mg p.o. twice daily today, and then switch that to 500 mg p.o. nightly tomorrow after the loading dose is done today.  He will also have available hydroxyzine for anxiety and trazodone for sleep.  Review of his laboratories revealed essentially normal electrolytes except for a mildly elevated creatinine at 1.27.  Liver function enzymes were normal.  His lipid panel was normal.  His CBC was essentially normal.  Valproic acid level was less than 10.  Hemoglobin A1c was 4.5.  TSH was 0.958.  Drug screen was completely negative.  Blood alcohol was less than 10.  We will order  an EKG given the antipsychotic medications.  His vital signs are stable, he is afebrile.  I certify that inpatient services furnished can reasonably be expected to improve the patient's condition.   Antonieta Pert, MD 07/06/2020, 10:21 AM

## 2020-07-07 DIAGNOSIS — F3113 Bipolar disorder, current episode manic without psychotic features, severe: Secondary | ICD-10-CM | POA: Diagnosis not present

## 2020-07-07 MED ORDER — LURASIDONE HCL 80 MG PO TABS
80.0000 mg | ORAL_TABLET | Freq: Every day | ORAL | Status: DC
  Administered 2020-07-08: 80 mg via ORAL
  Filled 2020-07-07 (×2): qty 1

## 2020-07-07 MED ORDER — TRAZODONE HCL 100 MG PO TABS
100.0000 mg | ORAL_TABLET | Freq: Every evening | ORAL | Status: DC | PRN
  Administered 2020-07-08 – 2020-07-09 (×2): 100 mg via ORAL
  Filled 2020-07-07 (×2): qty 1

## 2020-07-07 MED ORDER — LURASIDONE HCL 40 MG PO TABS
40.0000 mg | ORAL_TABLET | ORAL | Status: AC
  Administered 2020-07-07: 40 mg via ORAL
  Filled 2020-07-07: qty 1

## 2020-07-07 MED ORDER — OLANZAPINE 10 MG PO TBDP
10.0000 mg | ORAL_TABLET | Freq: Every day | ORAL | Status: DC
  Administered 2020-07-07: 10 mg via ORAL
  Filled 2020-07-07 (×2): qty 1

## 2020-07-07 NOTE — [facility] Suicide Risk Assessment (Signed)
[facility] INPATIENT:  Family/Significant Other Suicide Prevention Education  Suicide Prevention Education: Education Completed; Mother, Ashleigh Arya [phone]), has been identified by the patient as the family member/significant other with whom the patient will be residing, and identified as the person(s) who will aid the patient in the event of a mental health crisis (suicidal ideations/suicide attempt).  With written consent from the patient, the family member/significant other has been provided the following suicide prevention education, prior to the and/or following the discharge of the patient.  The suicide prevention education provided includes the following:  Suicide risk factors  Suicide prevention and interventions  National Suicide Hotline telephone number  [facility] assessment telephone number  [facility] Emergency Assistance 911  [facility] and/or Residential Mobile Crisis Unit telephone number   Request made of family/significant other to:  Remove weapons (e.g., guns, rifles, knives), all items previously/currently identified as safety concern.    Remove drugs/medications (over-the-counter, prescriptions, illicit drugs), all items previously/currently identified as a safety concern.   The family member/significant other verbalizes understanding of the suicide prevention education information provided.  The family member/significant other agrees to remove the items of safety concern listed above.  CSW spoke with this patients mother Matan Steen who reported that this patient has been off of his medicine for the past 6 months. Per Ms. Locust this patient moved into an apartment on his own around 6 months ago, his provider also retired around this time as well which she believes played a role in this patient stopping his medications.   Ruthann Cancer MSW, LCSW Clincal Social Worker  [facility]

## 2020-07-07 NOTE — Progress Notes (Addendum)
Pt rates his depression, anxiety and hopelessness all 0/10. Reports he slept good last night with good appetite, low energy and good concentration level. Pt denies SI, HI, AVH and pain. Appears preoccupied with medical work up "I had blurred vision before I came here, need my blood checked for STD and my energy level". Emotional support offered. Treatment compliance encouraged. Scheduled medications given with verbal education and effects monitored. Q 15 minutes safety checks maintained without outburst.  Pt remains safe on ane off unit. Pt attended scheduled MHT and pharmacist groups with prompts. Tolerates medications and meals well. Pt denies discomfort and adverse drug reactions at this time.

## 2020-07-07 NOTE — Progress Notes (Signed)
Adult Psychoeducational Group Note  Date:  07/07/2020 Time:  11:13 PM  Group Topic/Focus:  Wrap-Up Group:   The focus of this group is to help patients review their daily goal of treatment and discuss progress on daily workbooks.  Participation Level:  Active  Participation Quality:  Appropriate  Affect:  Appropriate  Cognitive:  Appropriate  Insight: Appropriate  Engagement in Group:  Developing/Improving  Modes of Intervention:  Discussion  Additional Comments: Pt stated his goal for today was to focus on his treatment plan. Pt stated he accomplished his goal today. Pt stated he was able to talk with his doctor and social worker about his care today. Pt rated his overall day a 10. Pt stated his relationship with his family and support system needs to be improved. Pt state he was able to contact his mother, brother, and sister today, which improved his day. Pt stated he was able to attend all meals. Pt stated he took all medications provided today. Pt stated he felt better about himself today.  Pt stated his appetite was pretty good today. Pt rated sleep last night was pretty good. Pt stated he was no physical pain today.  Pt deny auditory or visual hallucinations. Pt denies thoughts of harming himself or others. Pt stated he would alert staff if anything changes.   Felipa Furnace 07/07/2020, 11:13 PM

## 2020-07-07 NOTE — Progress Notes (Signed)
[facility]  07/07/2020 10:06 AM Veronia BeetsDaniel Surges  MRN:  [id number] Subjective: Patient is a 24 year old male with a past psychiatric history significant for schizoaffective disorder; bipolar type versus bipolar disorder admitted on 07/06/2020 secondary to manic behavior and noncompliance with medications.  Objective: Patient is seen and examined.  Patient is a 24 year old male with the above-stated past psychiatric history who is seen in follow-up.  He is slightly better today.  He still remains very somatic and wanted to have multiple laboratories and examinations done for his multiple physical complaints.  He was more easily redirected towards his psychiatric issues.  He remains paranoid, and still with the degree of tangentiality.  His speech is pressured, but certainly not as bad as yesterday.  His vital signs are stable, he is afebrile.  He only slept 4.75 hours last night.  Social work contacted his mother who reported that the patient had been off his medications for approximately the last 6 months.  His mother felt as though the patient had moved into an apartment on his own approximately 6 months ago, and his previous psychiatrist apparently had retired as well.  He was given Depakote DR 500 mg p.o. twice daily yesterday, and was to start the Depakote DR 500 mg p.o. q. at bedtime tonight.  We did switch him to JordanLatuda yesterday because of his fears and concerns about weight gain.  He continues to state that he is in "the best condition in his life".  He did receive Zyprexa 5 mg p.o. nightly last night in transition.  Laboratories from 11/15 again showed a mildly elevated creatinine at 1.27, but the rest of his electrolytes were normal.  His hemoglobin A1c was 4.5.  Prolactin was 3.7.  Drug screen was negative.  Principal Problem: <principal problem not specified> Diagnosis: Active Problems:   Schizophrenia (HCC)   Bipolar disorder with severe mania (HCC)  Total Time spent with patient: 20  minutes  Past Psychiatric History: See admission H&P  Past Medical History:  Past Medical History:  Diagnosis Date  . Manic behavior (HCC)   . Psychotic affective disorder (HCC) 08/30/2015   History reviewed. No pertinent surgical history. Family History: History reviewed. No pertinent family history. Family Psychiatric  History: See admission H&P Social History:  Social History   Substance and Sexual Activity  Alcohol Use No     Social History   Substance and Sexual Activity  Drug Use Not Currently  . Types: Marijuana   Comment: recent usage while out of town this week    Social History   Socioeconomic History  . Marital status: Single    Spouse name: Not on file  . Number of children: Not on file  . Years of education: Not on file  . Highest education level: Not on file  Occupational History  . Not on file  Tobacco Use  . Smoking status: Never Smoker  . Smokeless tobacco: Never Used  Vaping Use  . Vaping Use: Every day  . Substances: Nicotine, Flavoring  Substance and Sexual Activity  . Alcohol use: No  . Drug use: Not Currently    Types: Marijuana    Comment: recent usage while out of town this week  . Sexual activity: Not Currently  Other Topics Concern  . Not on file  Social History Narrative  . Not on file   Social Determinants of Health   Financial Resource Strain:   . Difficulty of Paying Living Expenses: Not on file  Food Insecurity:   .  Worried About Programme researcher, broadcasting/film/video in the Last Year: Not on file  . Ran Out of Food in the Last Year: Not on file  Transportation Needs:   . Lack of Transportation (Medical): Not on file  . Lack of Transportation (Non-Medical): Not on file  Physical Activity:   . Days of Exercise per Week: Not on file  . Minutes of Exercise per Session: Not on file  Stress:   . Feeling of Stress : Not on file  Social Connections:   . Frequency of Communication with Friends and Family: Not on file  . Frequency of Social  Gatherings with Friends and Family: Not on file  . Attends Religious Services: Not on file  . Active Member of Clubs or Organizations: Not on file  . Attends Banker Meetings: Not on file  . Marital Status: Not on file   Additional Social History:                         Sleep: Fair  Appetite:  Fair  Current Medications: Current Facility-Administered Medications  Medication Dose Route Frequency Provider Last Rate Last Admin  . acetaminophen (TYLENOL) tablet 650 mg  650 mg Oral Q6H PRN Oneta Rack, NP      . alum & mag hydroxide-simeth (MAALOX/MYLANTA) 200-200-20 MG/5ML suspension 30 mL  30 mL Oral Q4H PRN Oneta Rack, NP      . divalproex (DEPAKOTE) DR tablet 500 mg  500 mg Oral Q12H Antonieta Pert, MD   500 mg at 07/07/20 0750  . feeding supplement (BOOST / RESOURCE BREEZE) liquid 1 Container  1 Container Oral BID BM Antonieta Pert, MD   1 Container at 07/07/20 [phone]  . feeding supplement (ENSURE ENLIVE / ENSURE PLUS) liquid 237 mL  237 mL Oral Q24H Antonieta Pert, MD      . lurasidone (LATUDA) tablet 40 mg  40 mg Oral Q breakfast Antonieta Pert, MD   40 mg at 07/07/20 0750  . OLANZapine zydis (ZYPREXA) disintegrating tablet 5 mg  5 mg Oral QHS Antonieta Pert, MD   5 mg at 07/06/20 2042  . traZODone (DESYREL) tablet 50 mg  50 mg Oral QHS PRN Oneta Rack, NP   50 mg at 07/05/20 2128    Lab Results:  Results for orders placed or performed during the hospital encounter of 07/05/20 (from the past 48 hour(s))  Comprehensive metabolic panel     Status: Abnormal   Collection Time: 07/06/20  6:21 AM  Result Value Ref Range   Sodium 138 135 - 145 mmol/L   Potassium 4.4 3.5 - 5.1 mmol/L   Chloride 102 98 - 111 mmol/L   CO2 27 22 - 32 mmol/L   Glucose, Bld 77 70 - 99 mg/dL    Comment: Glucose reference range applies only to samples taken after fasting for at least 8 hours.   BUN 16 6 - 20 mg/dL   Creatinine, Ser 2.50 (H) 0.61 - 1.24  mg/dL   Calcium 9.4 8.9 - 53.9 mg/dL   Total Protein 7.2 6.5 - 8.1 g/dL   Albumin 4.3 3.5 - 5.0 g/dL   AST 21 15 - 41 U/L   ALT 25 0 - 44 U/L   Alkaline Phosphatase 76 38 - 126 U/L   Total Bilirubin 0.8 0.3 - 1.2 mg/dL   GFR, Estimated >76 >73 mL/min    Comment: (NOTE) Calculated using the CKD-EPI Creatinine Equation (  2021)    Anion gap 9 5 - 15    Comment: Performed at [facility], [address], [place], [place] [address]    Blood Alcohol level:  Lab Results  Component Value Date   [facility] <10 07/04/2020   ETH <5 08/21/2016    Metabolic Disorder Labs: Lab Results  Component Value Date   HGBA1C 4.5 (L) 07/04/2020   MPG 82.45 07/04/2020   Lab Results  Component Value Date   PROLACTIN 3.7 (L) 07/04/2020   Lab Results  Component Value Date   CHOL 193 07/04/2020   TRIG 62 07/04/2020   HDL 76 07/04/2020   CHOLHDL 2.5 07/04/2020   VLDL 12 07/04/2020   LDLCALC 105 (H) 07/04/2020    Physical Findings: AIMS:  , ,  ,  ,    CIWA:    COWS:     Musculoskeletal: Strength & Muscle Tone: within normal limits Gait & Station: normal Patient leans: N/A  Psychiatric Specialty Exam: Physical Exam Vitals and nursing note reviewed.  Constitutional:      Appearance: Normal appearance.  HENT:     Head: Normocephalic and atraumatic.  Pulmonary:     Effort: Pulmonary effort is normal.  Neurological:     General: No focal deficit present.     Mental Status: He is alert and oriented to person, place, and time.     Review of Systems  Blood pressure 127/84, pulse 97, temperature 97.6 F (36.4 C), temperature source Oral, resp. rate 18, height 5\' 8"  (1.727 m), weight 64.4 kg, SpO2 99 %.Body mass index is 21.59 kg/m.  General Appearance: Casual  Eye Contact:  Fair  Speech:  Normal Rate  Volume:  Normal  Mood:  Anxious, Dysphoric and Irritable  Affect:  Congruent  Thought Process:  Goal Directed and Descriptions of Associations: Tangential  Orientation:   Full (Time, Place, and Person)  Thought Content:  Delusions, Paranoid Ideation, Rumination and Tangential  Suicidal Thoughts:  No  Homicidal Thoughts:  No  Memory:  Immediate;   Fair Recent;   Fair Remote;   Fair  Judgement:  Intact  Insight:  Fair  Psychomotor Activity:  Increased  Concentration:  Concentration: Fair and Attention Span: Fair  Recall:  of Knowledge:  Fair  Language:  Good  Akathisia:  Negative  Handed:  Right  AIMS (if indicated):     Assets:  Desire for Improvement Resilience  ADL's:  Intact  Cognition:  WNL  Sleep:  Number of Hours: 4.75     Treatment Plan Summary: Daily contact with patient to assess and evaluate symptoms and progress in treatment, Medication management and Plan : Patient is seen and examined.  Patient is a 24 year old male with the above-stated past psychiatric history who is seen in follow-up.   Diagnosis: 1.  Schizoaffective disorder; bipolar type versus bipolar disorder, most recently manic, severe with psychotic features  Pertinent findings on examination today: 1.  Still very somatic and focused on his physical complaints of which there are many. 2.  Tangentiality and pressured speech have decreased a bit. 3.  Poor sleep.  Plan: 1.  Increase Latuda to 60 mg p.o. daily for psychosis and mood stability. 2.  Increase Zyprexa back to 10 mg p.o. nightly in the short run until 25 kicks in.  This is for mood stability, psychosis and sleep. 3.  Increase trazodone to 100 mg p.o. nightly as needed insomnia. 4.  Continue Depakote DR 500 mg p.o. twice daily for  mood stability at least in the short run. 5.  Disposition planning-in progress.  Antonieta Pert, MD 07/07/2020, 10:06 AM

## 2020-07-07 NOTE — Progress Notes (Signed)
Pt has been visible in the milieu present with calm affect. Pt observed interacting well with peers. Reported good day but concerns about the side effects of the medications he is taking. Pt stated he takes some creams at home for headache and wanted his family to bring it, pt encouraged to talk to the doctor before the family bring in the cream. Pt denied SI/HI and contracted for safety, will continue to monitor.

## 2020-07-07 NOTE — Progress Notes (Signed)
The focus of this group is to help patients establish daily goals to achieve during treatment and discuss how the patient can incorporate goal setting into their daily lives to aide in recovery. 

## 2020-07-08 DIAGNOSIS — F3113 Bipolar disorder, current episode manic without psychotic features, severe: Secondary | ICD-10-CM | POA: Diagnosis not present

## 2020-07-08 MED ORDER — LURASIDONE HCL 40 MG PO TABS
40.0000 mg | ORAL_TABLET | Freq: Every day | ORAL | Status: AC
  Administered 2020-07-08: 40 mg via ORAL
  Filled 2020-07-08 (×2): qty 1

## 2020-07-08 MED ORDER — NICOTINE POLACRILEX 2 MG MT GUM
2.0000 mg | CHEWING_GUM | OROMUCOSAL | Status: DC | PRN
  Administered 2020-07-08: 2 mg via ORAL

## 2020-07-08 MED ORDER — OLANZAPINE 5 MG PO TBDP
5.0000 mg | ORAL_TABLET | Freq: Every day | ORAL | Status: DC
  Administered 2020-07-08: 5 mg via ORAL
  Filled 2020-07-08 (×3): qty 1

## 2020-07-08 MED ORDER — DIVALPROEX SODIUM 250 MG PO DR TAB
750.0000 mg | DELAYED_RELEASE_TABLET | Freq: Every day | ORAL | Status: DC
  Administered 2020-07-09 – 2020-07-11 (×3): 750 mg via ORAL
  Filled 2020-07-08 (×4): qty 3

## 2020-07-08 MED ORDER — LURASIDONE HCL 60 MG PO TABS
120.0000 mg | ORAL_TABLET | Freq: Every day | ORAL | Status: DC
  Administered 2020-07-09: 120 mg via ORAL
  Filled 2020-07-08 (×3): qty 2

## 2020-07-08 NOTE — Progress Notes (Signed)
   07/08/20 2050  COVID-19 Daily Checkoff  Have you had a fever (temp > 37.80C/100F)  in the past 24 hours?  No  COVID-19 EXPOSURE  Have you traveled outside the state in the past 14 days? No  Have you been in contact with someone with a confirmed diagnosis of COVID-19 or PUI in the past 14 days without wearing appropriate PPE? No  Have you been living in the same home as a person with confirmed diagnosis of COVID-19 or a PUI (household contact)? No  Have you been diagnosed with COVID-19? No

## 2020-07-08 NOTE — Plan of Care (Signed)
Progress note  D: pt found in bed; compliant with medication administration. Pt is minimal with assessment but states they feel as though they are improving. Pt continues to seem pensive and preoccupied on approach. Pt denies si/hi/ah/vh and verbally agrees to approach staff if these become apparent or before harming themself/others while at [facility].  A: Pt provided support and encouragement. Pt given medication per protocol and standing orders. Q83m safety checks implemented and continued.  R: Pt safe on the unit. Will continue to monitor.  Pt progressing in the following metrics  Problem: Education: Goal: Knowledge of [facility] Education information/materials will improve Outcome: Progressing Goal: Emotional status will improve Outcome: Progressing Goal: Mental status will improve Outcome: Progressing Goal: Verbalization of understanding the information provided will improve Outcome: Progressing

## 2020-07-08 NOTE — Progress Notes (Signed)
Pt has been observed in the milieu interacting with peers. Pt expressed concern about gaining weight  as a result of taking medications. Pt was able to call her mother this evening, pt stated he wanted her mom to bring him some cream from home that he uses for headache. Pt denied pain at this time, took his meds, contracted fro safety, will continue to monitor.

## 2020-07-08 NOTE — Progress Notes (Signed)
[facility] MD Progress Note  07/08/2020 11:35 AM Nathan Holloway  MRN:  [id number] Subjective:  Patient is a 24 year old male with a past psychiatric history significant for schizoaffective disorder; bipolar type versus bipolar disorder admitted on 07/06/2020 secondary to manic behavior and noncompliance with medications.  Objective: Patient is seen and examined.  Patient is a 24 year old male with the above-stated past psychiatric history seen in follow-up.  He is less somatically oriented today.  He is still focused on weight gain.  He is concerned about having to continue to take the Zyprexa given weight gain in the past.  He also is now concerned about weight gain with the Depakote.  I discussed the fact that we had to continue the Zyprexa at least in the short run because of his lack of sleep.  His Latuda was increased yesterday.  We discussed the potential for changing the Depakote after discharge should he begin to gain any weight.  He denied any auditory or visual hallucinations.  He denied any suicidal ideation.  His vital signs are stable, he is afebrile.  His metabolic panel from 11/15 again had the mildly elevated creatinine at 1.27, but was otherwise normal.  Again, review of his previous laboratories revealed his creatinine 3 months ago to be 1.51, 4 days ago 1.27, and 2 days ago 1.27.  He did sleep 6 hours last night.    Principal Problem: <principal problem not specified> Diagnosis: Active Problems:   Schizophrenia (HCC)   Bipolar disorder with severe mania (HCC)  Total Time spent with patient: 20 minutes  Past Psychiatric History: See admission H&P  Past Medical History:  Past Medical History:  Diagnosis Date  . Manic behavior (HCC)   . Psychotic affective disorder (HCC) 08/30/2015   History reviewed. No pertinent surgical history. Family History: History reviewed. No pertinent family history. Family Psychiatric  History: See admission H&P Social History:  Social History    Substance and Sexual Activity  Alcohol Use No     Social History   Substance and Sexual Activity  Drug Use Not Currently  . Types: Marijuana   Comment: recent usage while out of town this week    Social History   Socioeconomic History  . Marital status: Single    Spouse name: Not on file  . Number of children: Not on file  . Years of education: Not on file  . Highest education level: Not on file  Occupational History  . Not on file  Tobacco Use  . Smoking status: Never Smoker  . Smokeless tobacco: Never Used  Vaping Use  . Vaping Use: Every day  . Substances: Nicotine, Flavoring  Substance and Sexual Activity  . Alcohol use: No  . Drug use: Not Currently    Types: Marijuana    Comment: recent usage while out of town this week  . Sexual activity: Not Currently  Other Topics Concern  . Not on file  Social History Narrative  . Not on file   Social Determinants of Health   Financial Resource Strain:   . Difficulty of Paying Living Expenses: Not on file  Food Insecurity:   . Worried About Programme researcher, broadcasting/film/video in the Last Year: Not on file  . Ran Out of Food in the Last Year: Not on file  Transportation Needs:   . Lack of Transportation (Medical): Not on file  . Lack of Transportation (Non-Medical): Not on file  Physical Activity:   . Days of Exercise per Week: Not on file  .  Minutes of Exercise per Session: Not on file  Stress:   . Feeling of Stress : Not on file  Social Connections:   . Frequency of Communication with Friends and Family: Not on file  . Frequency of Social Gatherings with Friends and Family: Not on file  . Attends Religious Services: Not on file  . Active Member of Clubs or Organizations: Not on file  . Attends Banker Meetings: Not on file  . Marital Status: Not on file   Additional Social History:                         Sleep: Fair  Appetite:  Fair  Current Medications: Current Facility-Administered  Medications  Medication Dose Route Frequency Provider Last Rate Last Admin  . acetaminophen (TYLENOL) tablet 650 mg  650 mg Oral Q6H PRN Oneta Rack, NP      . alum & mag hydroxide-simeth (MAALOX/MYLANTA) 200-200-20 MG/5ML suspension 30 mL  30 mL Oral Q4H PRN Oneta Rack, NP      . divalproex (DEPAKOTE) DR tablet 500 mg  500 mg Oral Q12H Antonieta Pert, MD   500 mg at 07/08/20 0745  . feeding supplement (BOOST / RESOURCE BREEZE) liquid 1 Container  1 Container Oral BID BM Antonieta Pert, MD   1 Container at 07/08/20 [phone]  . feeding supplement (ENSURE ENLIVE / ENSURE PLUS) liquid 237 mL  237 mL Oral Q24H Antonieta Pert, MD   237 mL at 07/07/20 2029  . lurasidone (LATUDA) tablet 80 mg  80 mg Oral Q breakfast Antonieta Pert, MD   80 mg at 07/08/20 0745  . OLANZapine zydis (ZYPREXA) disintegrating tablet 10 mg  10 mg Oral QHS Antonieta Pert, MD   10 mg at 07/07/20 2028  . traZODone (DESYREL) tablet 100 mg  100 mg Oral QHS PRN Antonieta Pert, MD        Lab Results: No results found for this or any previous visit (from the past 48 hour(s)).  Blood Alcohol level:  Lab Results  Component Value Date   ETH <10 07/04/2020   ETH <5 08/21/2016    Metabolic Disorder Labs: Lab Results  Component Value Date   HGBA1C 4.5 (L) 07/04/2020   MPG 82.45 07/04/2020   Lab Results  Component Value Date   PROLACTIN 3.7 (L) 07/04/2020   Lab Results  Component Value Date   CHOL 193 07/04/2020   TRIG 62 07/04/2020   HDL 76 07/04/2020   CHOLHDL 2.5 07/04/2020   VLDL 12 07/04/2020   LDLCALC 105 (H) 07/04/2020    Physical Findings: AIMS: Facial and Oral Movements Muscles of Facial Expression: None, normal Lips and Perioral Area: None, normal Jaw: None, normal Tongue: None, normal,Extremity Movements Upper (arms, wrists, hands, fingers): None, normal Lower (legs, knees, ankles, toes): None, normal, Trunk Movements Neck, shoulders, hips: None, normal, Overall  Severity Severity of abnormal movements (highest score from questions above): None, normal Incapacitation due to abnormal movements: None, normal Patient's awareness of abnormal movements (rate only patient's report): No Awareness, Dental Status Current problems with teeth and/or dentures?: No Does patient usually wear dentures?: No  CIWA:    COWS:     Musculoskeletal: Strength & Muscle Tone: within normal limits Gait & Station: normal Patient leans: N/A  Psychiatric Specialty Exam: Physical Exam Vitals and nursing note reviewed.  Constitutional:      Appearance: Normal appearance. He is normal weight.  HENT:  Head: Normocephalic and atraumatic.  Pulmonary:     Effort: Pulmonary effort is normal.  Neurological:     General: No focal deficit present.     Mental Status: He is alert and oriented to person, place, and time.     Review of Systems  Blood pressure 127/84, pulse 97, temperature 97.6 F (36.4 C), temperature source Oral, resp. rate 18, height 5\' 8"  (1.727 m), weight 64.4 kg, SpO2 99 %.Body mass index is 21.59 kg/m.  General Appearance: Casual  Eye Contact:  Fair  Speech:  Normal Rate  Volume:  Normal  Mood:  Dysphoric  Affect:  Congruent  Thought Process:  Coherent and Descriptions of Associations: Circumstantial  Orientation:  Full (Time, Place, and Person)  Thought Content:  Paranoid Ideation  Suicidal Thoughts:  No  Homicidal Thoughts:  No  Memory:  Immediate;   Fair Recent;   Fair Remote;   Fair  Judgement:  Intact  Insight:  Fair  Psychomotor Activity:  Increased  Concentration:  Concentration: Fair and Attention Span: Fair  Recall:  of Knowledge:  Good  Language:  Good  Akathisia:  Negative  Handed:  Right  AIMS (if indicated):     Assets:  Desire for Improvement Resilience  ADL's:  Intact  Cognition:  WNL  Sleep:  Number of Hours: 6     Treatment Plan Summary: Daily contact with patient to assess and evaluate symptoms and  progress in treatment, Medication management and Plan : Patient is seen and examined.  Patient is a 24 year old male with the above-stated past psychiatric history who is seen in follow-up.   Diagnosis: 1.  Schizoaffective disorder; bipolar type versus bipolar disorder, most recently manic, severe with psychotic features.  Pertinent findings on examination today: 1.  Somatic concerns are somewhat decreased today.  Physical complaints are decreased somewhat today. 2.  Tangentiality and pressured speech have decreased more from previous examinations. 3.  Sleep is slightly improved with increase in the Zyprexa.  Plan: 1.  Increase Latuda to 80 mg p.o. daily for psychosis and mood stability. 2.  Decrease Zyprexa back to 5 mg p.o. nightly in an attempt to improve sleep and maintain improvement in psychosis without the weight gain potential of Zyprexa. 3.  Continue trazodone 100 mg p.o. nightly as needed insomnia. 4.  Increase Depakote DR to 750 mg p.o. every afternoon rather than twice daily dosing at this point. 5.  Disposition planning-in progress.  25, MD 07/08/2020, 11:35 AM

## 2020-07-08 NOTE — [facility] Group Notes (Signed)
[facility] LCSW Group Therapy  07/08/2020 3:15 PM  Type of Therapy:  Psychoeducational Skills  Participation Level:  Active  Participation Quality:  Appropriate  Affect:  Appropriate  Cognitive:  Oriented  Insight:  Supportive  Engagement in Therapy:  Improving  Modes of Intervention:  Discussion  Summary of Progress/Problems: The focus of this group is to discuss communication, barriers to communication, as well as healthy ways to communicate with others. Dandy was able to give examples of effective communication and shared difficulty he faces communicating with mother.  Joelyn Oms Jihan Rudy, LCSW 07/08/2020, 3:15 PM

## 2020-07-09 DIAGNOSIS — F3113 Bipolar disorder, current episode manic without psychotic features, severe: Secondary | ICD-10-CM | POA: Diagnosis not present

## 2020-07-09 MED ORDER — OLANZAPINE 5 MG PO TBDP
15.0000 mg | ORAL_TABLET | Freq: Every day | ORAL | Status: DC
  Administered 2020-07-09 – 2020-07-10 (×2): 15 mg via ORAL
  Filled 2020-07-09 (×5): qty 1

## 2020-07-09 MED ORDER — METFORMIN HCL 500 MG PO TABS
500.0000 mg | ORAL_TABLET | Freq: Every day | ORAL | Status: DC
  Administered 2020-07-10 – 2020-07-11 (×2): 500 mg via ORAL
  Filled 2020-07-09 (×4): qty 1

## 2020-07-09 NOTE — [facility] Group Notes (Signed)
Occupational Therapy Group Note Date: 07/09/2020 Group Topic/Focus: Socialization/Social Skills  Group Description: Group encouraged increased engagement and participation through discussion/activity focused on improving socialization/age appropriate social skills. Patients were encouraged to complete a worksheet as a brief check-in to identify something they want the group to know about them, something that is difficult today, and a goal for the day. Group then engaged in an interactive activity called "Name 5" where patients were encouraged to identify a relevant and appropriate response to identified topic introduced. Activity focused on improving problem-solving skills, orientation, attention, concentration, and social skills.   Therapeutic Goal(s): - Patients will demonstrate and practice age-appropriate social skills within a group setting - Identify a treatment focused goal for the day Participation Level: Active   Participation Quality: Independent   Behavior: Calm and Cooperative   Speech/Thought Process: Focused   Affect/Mood: Constricted   Insight: Fair   Judgement: Fair   Individualization: Nathan Holloway joined group late, however was active in his participation of discussion and activity. Pt was interactive with peers appropriately and identified something that is difficult today is "not being able to listen to my music." Pt shared that he is managing by asking staff to listen to music and "meditation."  Modes of Intervention: Activity, Discussion, Education, Reality Testing and Socialization  Patient Response to Interventions:  Attentive, Engaged and Interested   Plan: Continue to engage patient in OT groups 2 - 3x/week.   07/09/2020  Nathan Holloway, MOT, OTR/L

## 2020-07-09 NOTE — Progress Notes (Signed)
[facility] MD Progress Note  07/09/2020 3:52 PM Nathan Holloway  MRN:  [id number] Subjective:  Patient is a 24 year old male with a past psychiatric history significant for schizoaffective disorder; bipolar type versus bipolar disorder admitted on 07/06/2020 secondary to manic behavior and noncompliance with medications.  Objective: Patient is seen and examined.  Patient is a 24 year old male with the above-stated past psychiatric history who is seen in follow-up.  From a mood and psychotic standpoint he is clearly improving.  He went outside today and did quite well.  No new laboratories.  His vital signs are stable, he is afebrile.  Despite the fact of increasing his medications his sleep is still not great.  He got 5.45 hours last night.  His Depakote was consolidated to 750 mg p.o. every afternoon rather than 500 mg twice daily.  He also received 120 mg of Latuda both yesterday and today.  Unfortunately his sleep is still not great with the olanzapine at 5 mg p.o. nightly.  We discussed the potential of adding Metformin to his medication regimen to be able to reduce his appetite and hopefully lead to weight control.  It does appear that the Latuda may not be fully effective without Zyprexa to control his illness.  I emphasized that we would stop the Latuda and increase his Zyprexa back up to his previous dosage of 15 mg which had been successful.  We discussed the side effects of Metformin and the dosage range.  We also discussed the fact of a brand-new medication which is a combination drug with Zyprexa and a new antagonist that controls weight gain, and that his insurance would approve this medicine if the Metformin failed to control his weight.  He denied any auditory or visual hallucinations.  He is certainly nowhere as somatic as he had been.  No racing thoughts, no pressured speech.  No suicidal or homicidal ideation.  Principal Problem: <principal problem not specified> Diagnosis: Active Problems:    Schizophrenia (HCC)   Bipolar disorder with severe mania (HCC)  Total Time spent with patient: 20 minutes  Past Psychiatric History: See admission H&P  Past Medical History:  Past Medical History:  Diagnosis Date  . Manic behavior (HCC)   . Psychotic affective disorder (HCC) 08/30/2015   History reviewed. No pertinent surgical history. Family History: History reviewed. No pertinent family history. Family Psychiatric  History: See admission H&P Social History:  Social History   Substance and Sexual Activity  Alcohol Use No     Social History   Substance and Sexual Activity  Drug Use Not Currently  . Types: Marijuana   Comment: recent usage while out of town this week    Social History   Socioeconomic History  . Marital status: Single    Spouse name: Not on file  . Number of children: Not on file  . Years of education: Not on file  . Highest education level: Not on file  Occupational History  . Not on file  Tobacco Use  . Smoking status: Never Smoker  . Smokeless tobacco: Never Used  Vaping Use  . Vaping Use: Every day  . Substances: Nicotine, Flavoring  Substance and Sexual Activity  . Alcohol use: No  . Drug use: Not Currently    Types: Marijuana    Comment: recent usage while out of town this week  . Sexual activity: Not Currently  Other Topics Concern  . Not on file  Social History Narrative  . Not on file   Social Determinants of  Health   Financial Resource Strain:   . Difficulty of Paying Living Expenses: Not on file  Food Insecurity:   . Worried About Programme researcher, broadcasting/film/video in the Last Year: Not on file  . Ran Out of Food in the Last Year: Not on file  Transportation Needs:   . Lack of Transportation (Medical): Not on file  . Lack of Transportation (Non-Medical): Not on file  Physical Activity:   . Days of Exercise per Week: Not on file  . Minutes of Exercise per Session: Not on file  Stress:   . Feeling of Stress : Not on file  Social  Connections:   . Frequency of Communication with Friends and Family: Not on file  . Frequency of Social Gatherings with Friends and Family: Not on file  . Attends Religious Services: Not on file  . Active Member of Clubs or Organizations: Not on file  . Attends Banker Meetings: Not on file  . Marital Status: Not on file   Additional Social History:                         Sleep: Fair  Appetite:  Good  Current Medications: Current Facility-Administered Medications  Medication Dose Route Frequency Provider Last Rate Last Admin  . acetaminophen (TYLENOL) tablet 650 mg  650 mg Oral Q6H PRN Oneta Rack, NP      . alum & mag hydroxide-simeth (MAALOX/MYLANTA) 200-200-20 MG/5ML suspension 30 mL  30 mL Oral Q4H PRN Oneta Rack, NP      . divalproex (DEPAKOTE) DR tablet 750 mg  750 mg Oral Q supper Antonieta Pert, MD      . Lurasidone HCl TABS 120 mg  120 mg Oral Q breakfast Antonieta Pert, MD   120 mg at 07/09/20 0755  . [START ON 07/10/2020] metFORMIN (GLUCOPHAGE) tablet 500 mg  500 mg Oral Q breakfast Antonieta Pert, MD      . nicotine polacrilex (NICORETTE) gum 2 mg  2 mg Oral PRN Antonieta Pert, MD   2 mg at 07/08/20 1603  . OLANZapine zydis (ZYPREXA) disintegrating tablet 5 mg  5 mg Oral QHS Antonieta Pert, MD   5 mg at 07/08/20 2051  . traZODone (DESYREL) tablet 100 mg  100 mg Oral QHS PRN Antonieta Pert, MD   100 mg at 07/08/20 2051    Lab Results: No results found for this or any previous visit (from the past 48 hour(s)).  Blood Alcohol level:  Lab Results  Component Value Date   ETH <10 07/04/2020   ETH <5 08/21/2016    Metabolic Disorder Labs: Lab Results  Component Value Date   HGBA1C 4.5 (L) 07/04/2020   MPG 82.45 07/04/2020   Lab Results  Component Value Date   PROLACTIN 3.7 (L) 07/04/2020   Lab Results  Component Value Date   CHOL 193 07/04/2020   TRIG 62 07/04/2020   HDL 76 07/04/2020   CHOLHDL 2.5  07/04/2020   VLDL 12 07/04/2020   LDLCALC 105 (H) 07/04/2020    Physical Findings: AIMS: Facial and Oral Movements Muscles of Facial Expression: None, normal Lips and Perioral Area: None, normal Jaw: None, normal Tongue: None, normal,Extremity Movements Upper (arms, wrists, hands, fingers): None, normal Lower (legs, knees, ankles, toes): None, normal, Trunk Movements Neck, shoulders, hips: None, normal, Overall Severity Severity of abnormal movements (highest score from questions above): None, normal Incapacitation due to abnormal movements: None,  normal Patient's awareness of abnormal movements (rate only patient's report): No Awareness, Dental Status Current problems with teeth and/or dentures?: No Does patient usually wear dentures?: No  CIWA:    COWS:     Musculoskeletal: Strength & Muscle Tone: within normal limits Gait & Station: normal Patient leans: N/A  Psychiatric Specialty Exam: Physical Exam Vitals and nursing note reviewed.  Constitutional:      Appearance: Normal appearance.  HENT:     Head: Normocephalic and atraumatic.  Pulmonary:     Effort: Pulmonary effort is normal.  Neurological:     General: No focal deficit present.     Mental Status: He is alert and oriented to person, place, and time.     Review of Systems  Blood pressure 138/84, pulse 84, temperature 98.1 F (36.7 C), temperature source Oral, resp. rate 18, height 5\' 8"  (1.727 m), weight 64.4 kg, SpO2 100 %.Body mass index is 21.59 kg/m.  General Appearance: Casual  Eye Contact:  Good  Speech:  Normal Rate  Volume:  Normal  Mood:  Anxious  Affect:  Congruent  Thought Process:  Coherent and Descriptions of Associations: Intact  Orientation:  Full (Time, Place, and Person)  Thought Content:  Logical  Suicidal Thoughts:  No  Homicidal Thoughts:  No  Memory:  Immediate;   Fair Recent;   Fair Remote;   Fair  Judgement:  Intact  Insight:  Fair  Psychomotor Activity:  Increased   Concentration:  Concentration: Fair and Attention Span: Fair  Recall:  of Knowledge:  Fair  Language:  Good  Akathisia:  Negative  Handed:  Right  AIMS (if indicated):     Assets:  Desire for Improvement Resilience  ADL's:  Intact  Cognition:  WNL  Sleep:  Number of Hours: 5.45     Treatment Plan Summary: Daily contact with patient to assess and evaluate symptoms and progress in treatment, Medication management and Plan : Patient is seen and examined.  Patient is a 24 year old male with the above-stated past psychiatric history who is seen in follow-up.   Diagnosis: 1.  Schizoaffective disorder; bipolar type versus bipolar disorder, most recently manic, severe with psychotic features.  Pertinent findings on examination today: 1.  Mood symptoms and mania are clearly improved. 2.  Sleep remains disorder despite maximal dosages of Latuda with Zyprexa.  This also includes Depakote DR 750 mg p.o. every afternoon.  Plan: 1.  Stop Latuda. 2.    Increase Zyprexa back to 15 mg p.o. nightly in an attempt to improve sleep and maintain improvement in psychosis. 3.  Continue trazodone 100 mg p.o. nightly as needed insomnia. 4.  Continue Depakote DR to 750 mg p.o. every afternoon rather than twice daily dosing at this point. 5.    Add Metformin 500 mg p.o. daily AC in an attempt to control weight gain from medications. 6.  Depakote level, CBC with differential and liver function enzymes in a.m. tomorrow. 7.  Disposition planning-in progress.    25, MD 07/09/2020, 3:52 PM

## 2020-07-09 NOTE — Progress Notes (Signed)
Pt was seen in the dayroom tonight. He is pleasant and calm upon approach. He said that he has been writing in his journal to relax and is planning his career goals. Pt denies SI/HI and AVH. Active listening, reassurance, and support provided. Medications administered as ordered by MD. Q 15 min safety checks continue. Pt's safety has been maintained.   07/08/20 2050  Psych Admission Type (Psych Patients Only)  Admission Status Voluntary  Psychosocial Assessment  Patient Complaints Anxiety  Eye Contact Fair  Facial Expression Flat  Affect Appropriate to circumstance  Speech Logical/coherent  Interaction Assertive  Motor Activity Other (Comment) (WNL)  Appearance/Hygiene Unremarkable  Behavior Characteristics Cooperative;Anxious  Mood Anxious;Pleasant  Thought Process  Coherency WDL  Content Paranoia  Delusions Paranoid  Perception WDL  Hallucination None reported or observed  Judgment Poor  Confusion None  Danger to Self  Current suicidal ideation? Denies  Danger to Others  Danger to Others None reported or observed

## 2020-07-09 NOTE — Progress Notes (Signed)
Recreation Therapy Notes  Date: 11.18.21 Time: 1000 Location:  [address] Dayroom  Group Topic: Communication, Team Building, Problem Solving  Goal Area(s) Addresses:  Patient will effectively work with peer towards shared goal.  Patient will identify skills used to make activity successful.  Patient will identify how skills used during activity can be used to reach post d/c goals.   Behavioral Response: Minimal  Intervention: STEM Activity  Activity: Straw Bridge.  In groups, patients were given 15 straws and 76ft of masking tape.  Patients were to build a free standing bridge that could hold a small puzzle box without it falling over.    Education: Pharmacist, community, Discharge Planning   Education Outcome: Acknowledges education/In group clarification offered/Needs additional education.   Clinical Observations/Feedback: Pt gave minimal effort during group session.  Pt didn't do much to help peer create and put together their bridge.  Pt was more observant that anything.    Caroll Rancher, LRT/CTRS         Caroll Rancher A 07/09/2020 11:49 AM

## 2020-07-10 DIAGNOSIS — F3113 Bipolar disorder, current episode manic without psychotic features, severe: Secondary | ICD-10-CM | POA: Diagnosis not present

## 2020-07-10 LAB — COMPREHENSIVE METABOLIC PANEL
ALT: 18 U/L (ref 0–44)
AST: 16 U/L (ref 15–41)
Albumin: 4 g/dL (ref 3.5–5.0)
Alkaline Phosphatase: 76 U/L (ref 38–126)
Anion gap: 9 (ref 5–15)
BUN: 16 mg/dL (ref 6–20)
CO2: 28 mmol/L (ref 22–32)
Calcium: 9.1 mg/dL (ref 8.9–10.3)
Chloride: 104 mmol/L (ref 98–111)
Creatinine, Ser: 1.21 mg/dL (ref 0.61–1.24)
GFR, Estimated: >60 mL/min (ref >60)
Glucose, Bld: 87 mg/dL (ref 70–99)
Potassium: 4.2 mmol/L (ref 3.5–5.1)
Sodium: 141 mmol/L (ref 135–145)
Total Bilirubin: 0.8 mg/dL (ref 0.3–1.2)
Total Protein: 6.4 g/dL — ABNORMAL LOW (ref 6.5–8.1)

## 2020-07-10 LAB — CBC WITH DIFFERENTIAL/PLATELET
Abs Immature Granulocytes: 0.01 10*3/uL (ref 0.00–0.07)
Basophils Absolute: 0 10*3/uL (ref 0.0–0.1)
Basophils Relative: 1 %
Eosinophils Absolute: 0.1 10*3/uL (ref 0.0–0.5)
Eosinophils Relative: 1 %
HCT: 44.6 % (ref 39.0–52.0)
Hemoglobin: 14.4 g/dL (ref 13.0–17.0)
Immature Granulocytes: 0 %
Lymphocytes Relative: 38 %
Lymphs Abs: 2.4 10*3/uL (ref 0.7–4.0)
MCH: 29.1 pg (ref 26.0–34.0)
MCHC: 32.3 g/dL (ref 30.0–36.0)
MCV: 90.1 fL (ref 80.0–100.0)
Monocytes Absolute: 0.6 10*3/uL (ref 0.1–1.0)
Monocytes Relative: 9 %
Neutro Abs: 3.2 10*3/uL (ref 1.7–7.7)
Neutrophils Relative %: 51 %
Platelets: 226 10*3/uL (ref 150–400)
RBC: 4.95 MIL/uL (ref 4.22–5.81)
RDW: 12.6 % (ref 11.5–15.5)
WBC: 6.2 10*3/uL (ref 4.0–10.5)
nRBC: 0 % (ref 0.0–0.2)

## 2020-07-10 LAB — VALPROIC ACID LEVEL: Valproic Acid Lvl: 61 ug/mL (ref 50.0–100.0)

## 2020-07-10 NOTE — Progress Notes (Signed)
Patient denies SI, HI and AVH.  Patient has attended groups engaged in activities on the unit.  Patient has had no incidents of behavioral dyscontrol this shift.   Assess patient for safety.  Offer medications as prescribed, engage patient in 1:1 staff talks.   Continue to monitor as prescribed.  Patient able to contract for safety.  

## 2020-07-10 NOTE — Tx Team (Signed)
Interdisciplinary Treatment and Diagnostic Plan Update  07/10/2020 Time of Session: 9:05am Nathan Holloway MRN: [id number]  Principal Diagnosis: <principal problem not specified>  Secondary Diagnoses: Active Problems:   Schizophrenia (HCC)   Bipolar disorder with severe mania (HCC)   Current Medications:  Current Facility-Administered Medications  Medication Dose Route Frequency Provider Last Rate Last Admin  . acetaminophen (TYLENOL) tablet 650 mg  650 mg Oral Q6H PRN Oneta Rack, NP      . alum & mag hydroxide-simeth (MAALOX/MYLANTA) 200-200-20 MG/5ML suspension 30 mL  30 mL Oral Q4H PRN Oneta Rack, NP      . divalproex (DEPAKOTE) DR tablet 750 mg  750 mg Oral Q supper Antonieta Pert, MD   750 mg at 07/09/20 1741  . metFORMIN (GLUCOPHAGE) tablet 500 mg  500 mg Oral Q breakfast Antonieta Pert, MD   500 mg at 07/10/20 0816  . nicotine polacrilex (NICORETTE) gum 2 mg  2 mg Oral PRN Antonieta Pert, MD   2 mg at 07/08/20 1603  . OLANZapine zydis (ZYPREXA) disintegrating tablet 15 mg  15 mg Oral QHS Antonieta Pert, MD   15 mg at 07/09/20 2047  . traZODone (DESYREL) tablet 100 mg  100 mg Oral QHS PRN Antonieta Pert, MD   100 mg at 07/09/20 2048   PTA Medications: No medications prior to admission.    Patient Stressors: Health problems Medication change or noncompliance  Patient Strengths: Ability for insight Licensed conveyancer Supportive family/friends  Treatment Modalities: Medication Management, Group therapy, Case management,  1 to 1 session with clinician, Psychoeducation, Recreational therapy.   Physician Treatment Plan for Primary Diagnosis: <principal problem not specified> Long Term Goal(s): Improvement in symptoms so as ready for discharge Improvement in symptoms so as ready for discharge   Short Term Goals: Ability to identify changes in lifestyle to reduce recurrence of condition will improve Ability to verbalize feelings  will improve Ability to demonstrate self-control will improve Ability to identify and develop effective coping behaviors will improve Ability to maintain clinical measurements within normal limits will improve Compliance with prescribed medications will improve Ability to identify triggers associated with substance abuse/mental health issues will improve Ability to identify changes in lifestyle to reduce recurrence of condition will improve Ability to verbalize feelings will improve Ability to demonstrate self-control will improve Ability to identify and develop effective coping behaviors will improve Ability to maintain clinical measurements within normal limits will improve Compliance with prescribed medications will improve Ability to identify triggers associated with substance abuse/mental health issues will improve  Medication Management: Evaluate patient's response, side effects, and tolerance of medication regimen.  Therapeutic Interventions: 1 to 1 sessions, Unit Group sessions and Medication administration.  Evaluation of Outcomes: Progressing  Physician Treatment Plan for Secondary Diagnosis: Active Problems:   Schizophrenia (HCC)   Bipolar disorder with severe mania (HCC)  Long Term Goal(s): Improvement in symptoms so as ready for discharge Improvement in symptoms so as ready for discharge   Short Term Goals: Ability to identify changes in lifestyle to reduce recurrence of condition will improve Ability to verbalize feelings will improve Ability to demonstrate self-control will improve Ability to identify and develop effective coping behaviors will improve Ability to maintain clinical measurements within normal limits will improve Compliance with prescribed medications will improve Ability to identify triggers associated with substance abuse/mental health issues will improve Ability to identify changes in lifestyle to reduce recurrence of condition will improve Ability  to verbalize feelings will  improve Ability to demonstrate self-control will improve Ability to identify and develop effective coping behaviors will improve Ability to maintain clinical measurements within normal limits will improve Compliance with prescribed medications will improve Ability to identify triggers associated with substance abuse/mental health issues will improve     Medication Management: Evaluate patient's response, side effects, and tolerance of medication regimen.  Therapeutic Interventions: 1 to 1 sessions, Unit Group sessions and Medication administration.  Evaluation of Outcomes: Progressing   RN Treatment Plan for Primary Diagnosis: <principal problem not specified> Long Term Goal(s): Knowledge of disease and therapeutic regimen to maintain health will improve  Short Term Goals: Ability to remain free from injury will improve, Ability to demonstrate self-control, Ability to participate in decision making will improve, Ability to identify and develop effective coping behaviors will improve and Compliance with prescribed medications will improve  Medication Management: RN will administer medications as ordered by provider, will assess and evaluate patient's response and provide education to patient for prescribed medication. RN will report any adverse and/or side effects to prescribing provider.  Therapeutic Interventions: 1 on 1 counseling sessions, Psychoeducation, Medication administration, Evaluate responses to treatment, Monitor vital signs and CBGs as ordered, Perform/monitor CIWA, COWS, AIMS and Fall Risk screenings as ordered, Perform wound care treatments as ordered.  Evaluation of Outcomes: Progressing   LCSW Treatment Plan for Primary Diagnosis: <principal problem not specified> Long Term Goal(s): Safe transition to appropriate next level of care at discharge, Engage patient in therapeutic group addressing interpersonal concerns.  Short Term Goals: Engage  patient in aftercare planning with referrals and resources, Increase social support, Increase emotional regulation, Identify triggers associated with mental health/substance abuse issues and Increase skills for wellness and recovery  Therapeutic Interventions: Assess for all discharge needs, 1 to 1 time with Social worker, Explore available resources and support systems, Assess for adequacy in community support network, Educate family and significant other(s) on suicide prevention, Complete Psychosocial Assessment, Interpersonal group therapy.  Evaluation of Outcomes: Progressing   Progress in Treatment: Attending groups: Yes. Participating in groups: Yes. Taking medication as prescribed: Yes. Toleration medication: Yes. Family/Significant other contact made: Yes, individual(s) contacted:  mother Patient understands diagnosis: Yes. Discussing patient identified problems/goals with staff: Yes. Medical problems stabilized or resolved: Yes. Denies suicidal/homicidal ideation: Yes. Issues/concerns per patient self-inventory: No.   New problem(s) identified: No, Describe:  none  New Short Term/Long Term Goal(s): medication stabilization, elimination of SI thoughts, development of comprehensive mental wellness plan.   Patient Goals: "To slow down, keep good energy, clear my mind and keep balance"   Discharge Plan or Barriers: Patient is to return home to live with family and is to follow up with [facility] for medication management and therapy.  Reason for Continuation of Hospitalization: Medication stabilization  Estimated Length of Stay: 1-3 days  Attendees: Patient:  07/06/2020   Physician:  07/06/2020   Nursing:  07/06/2020   RN Care Manager: 07/06/2020   Social Worker: Ruthann Cancer, LCSW 07/06/2020   Recreational Therapist:  07/06/2020   Other:  07/06/2020  Other:  07/06/2020  Other: 07/06/2020       Scribe for Treatment Team: Otelia Santee,  LCSW 07/10/2020 9:25 AM

## 2020-07-10 NOTE — Progress Notes (Signed)
Pt presents with a bright affect and mood. Pt appears to be improving and denies any racing thoughts. He has been present in the dayroom and calm/cooperative upon assessment. He is focused on relaxing after discharge and working on getting a job. 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 Pt denies SI/HI and AVH. Active listening, reassurance, and support provided.Medications administered as ordered by MD.Q 15 min safety checks continue. Pt's safety has been maintained.   07/09/20 2047  Psych Admission Type (Psych Patients Only)  Admission Status Voluntary  Psychosocial Assessment  Patient Complaints Anxiety  Eye Contact Fair  Facial Expression Other (Comment) (appropriate)  Affect Anxious  Speech Logical/coherent  Interaction Assertive  Motor Activity Other (Comment) (WNL)  Appearance/Hygiene Unremarkable  Behavior Characteristics Cooperative;Anxious  Mood Anxious;Pleasant  Thought Process  Coherency WDL  Content Paranoia  Delusions Paranoid  Perception WDL  Hallucination None reported or observed  Judgment Poor  Confusion None  Danger to Self  Current suicidal ideation? Denies  Danger to Others  Danger to Others None reported or observed

## 2020-07-10 NOTE — Progress Notes (Signed)
   07/09/20 2047  COVID-19 Daily Checkoff  Have you had a fever (temp > 37.80C/100F)  in the past 24 hours?  No  COVID-19 EXPOSURE  Have you traveled outside the state in the past 14 days? No  Have you been in contact with someone with a confirmed diagnosis of COVID-19 or PUI in the past 14 days without wearing appropriate PPE? No  Have you been living in the same home as a person with confirmed diagnosis of COVID-19 or a PUI (household contact)? No  Have you been diagnosed with COVID-19? No

## 2020-07-10 NOTE — [facility] Group Notes (Signed)
[facility] LCSW Group Therapy  07/10/2020 3:25 PM  Type of Therapy:  Group Therapy  Participation Level:  Active  Participation Quality:  Appropriate  Affect:  Appropriate  Cognitive:  Appropriate  Insight:  Developing/Improving  Engagement in Therapy:  Engaged and Supportive  Modes of Intervention:  Discussion  Summary of Progress/Problems: Patient attended and participated in group. Patient shared that he struggles with over thinking and knowing who he can trust. Patient also stated he is working to rebuild trust with sister and his girlfriend.   Nathan Holloway 07/10/2020, 3:25 PM

## 2020-07-10 NOTE — Progress Notes (Signed)
[facility] MD Progress Note  07/10/2020 1:08 PM Nathan Holloway  MRN:  [id number] Subjective:  Patient is a 24 year old male with a past psychiatric history significant for schizoaffective disorder; bipolar type versus bipolar disorder admitted on 07/06/2020 secondary to manic behavior and noncompliance with medications.  Objective: Patient is seen and examined.  Patient is a 24 year old male with the above-stated past psychiatric history who is seen in follow-up.  He is doing well.  He tolerated the Zyprexa last night.  He stated he got the best sleep that he had had in quite a while.  He slept over 6 hours.  He denied any auditory or visual hallucinations.  He denied any suicidal or homicidal ideation.  He is not euphoric.  His speech is not pressured.  He took the Metformin this morning, and denied any side effects to it.  His laboratories from this a.m. showed normal electrolytes including liver function enzymes.  His CBC was normal.  His valproic acid level was 61.  We discussed planning discharge hopefully for tomorrow if he continues to do well on his current dosage of Zyprexa and Depakote.  Principal Problem: <principal problem not specified> Diagnosis: Active Problems:   Schizophrenia (HCC)   Bipolar disorder with severe mania (HCC)  Total Time spent with patient: 20 minutes  Past Psychiatric History: See admission H&P  Past Medical History:  Past Medical History:  Diagnosis Date  . Manic behavior (HCC)   . Psychotic affective disorder (HCC) 08/30/2015   History reviewed. No pertinent surgical history. Family History: History reviewed. No pertinent family history. Family Psychiatric  History: See admission H&P Social History:  Social History   Substance and Sexual Activity  Alcohol Use No     Social History   Substance and Sexual Activity  Drug Use Not Currently  . Types: Marijuana   Comment: recent usage while out of town this week    Social History   Socioeconomic History   . Marital status: Single    Spouse name: Not on file  . Number of children: Not on file  . Years of education: Not on file  . Highest education level: Not on file  Occupational History  . Not on file  Tobacco Use  . Smoking status: Never Smoker  . Smokeless tobacco: Never Used  Vaping Use  . Vaping Use: Every day  . Substances: Nicotine, Flavoring  Substance and Sexual Activity  . Alcohol use: No  . Drug use: Not Currently    Types: Marijuana    Comment: recent usage while out of town this week  . Sexual activity: Not Currently  Other Topics Concern  . Not on file  Social History Narrative  . Not on file   Social Determinants of Health   Financial Resource Strain:   . Difficulty of Paying Living Expenses: Not on file  Food Insecurity:   . Worried About Programme researcher, broadcasting/film/video in the Last Year: Not on file  . Ran Out of Food in the Last Year: Not on file  Transportation Needs:   . Lack of Transportation (Medical): Not on file  . Lack of Transportation (Non-Medical): Not on file  Physical Activity:   . Days of Exercise per Week: Not on file  . Minutes of Exercise per Session: Not on file  Stress:   . Feeling of Stress : Not on file  Social Connections:   . Frequency of Communication with Friends and Family: Not on file  . Frequency of Social Gatherings with Friends  and Family: Not on file  . Attends Religious Services: Not on file  . Active Member of Clubs or Organizations: Not on file  . Attends Banker Meetings: Not on file  . Marital Status: Not on file   Additional Social History:                         Sleep: Good  Appetite:  Good  Current Medications: Current Facility-Administered Medications  Medication Dose Route Frequency Provider Last Rate Last Admin  . acetaminophen (TYLENOL) tablet 650 mg  650 mg Oral Q6H PRN Oneta Rack, NP      . alum & mag hydroxide-simeth (MAALOX/MYLANTA) 200-200-20 MG/5ML suspension 30 mL  30 mL Oral  Q4H PRN Oneta Rack, NP      . divalproex (DEPAKOTE) DR tablet 750 mg  750 mg Oral Q supper Antonieta Pert, MD   750 mg at 07/09/20 1741  . metFORMIN (GLUCOPHAGE) tablet 500 mg  500 mg Oral Q breakfast Antonieta Pert, MD   500 mg at 07/10/20 0816  . nicotine polacrilex (NICORETTE) gum 2 mg  2 mg Oral PRN Antonieta Pert, MD   2 mg at 07/08/20 1603  . OLANZapine zydis (ZYPREXA) disintegrating tablet 15 mg  15 mg Oral QHS Antonieta Pert, MD   15 mg at 07/09/20 2047  . traZODone (DESYREL) tablet 100 mg  100 mg Oral QHS PRN Antonieta Pert, MD   100 mg at 07/09/20 2048    Lab Results:  Results for orders placed or performed during the hospital encounter of 07/05/20 (from the past 48 hour(s))  CBC with Differential/Platelet     Status: None   Collection Time: 07/10/20  8:47 AM  Result Value Ref Range   WBC 6.2 4.0 - 10.5 K/uL   RBC 4.95 4.22 - 5.81 MIL/uL   Hemoglobin 14.4 13.0 - 17.0 g/dL   HCT 44.9 39 - 52 %   MCV 90.1 80.0 - 100.0 fL   MCH 29.1 26.0 - 34.0 pg   MCHC 32.3 30.0 - 36.0 g/dL   RDW 67.5 91.6 - 38.4 %   Platelets 226 150 - 400 K/uL   nRBC 0.0 0.0 - 0.2 %   Neutrophils Relative % 51 %   Neutro Abs 3.2 1.7 - 7.7 K/uL   Lymphocytes Relative 38 %   Lymphs Abs 2.4 0.7 - 4.0 K/uL   Monocytes Relative 9 %   Monocytes Absolute 0.6 0.1 - 1.0 K/uL   Eosinophils Relative 1 %   Eosinophils Absolute 0.1 0.0 - 0.5 K/uL   Basophils Relative 1 %   Basophils Absolute 0.0 0.0 - 0.1 K/uL   Immature Granulocytes 0 %   Abs Immature Granulocytes 0.01 0.00 - 0.07 K/uL    Comment: Performed at [facility], [address], [place], [place] [address]  Valproic acid level     Status: None   Collection Time: 07/10/20  8:47 AM  Result Value Ref Range   Valproic Acid Lvl 61 50.0 - 100.0 ug/mL    Comment: Performed at [facility], [address] [place], [place] [address]  Comprehensive metabolic panel     Status: Abnormal    Collection Time: 07/10/20  8:47 AM  Result Value Ref Range   Sodium 141 135 - 145 mmol/L   Potassium 4.2 3.5 - 5.1 mmol/L   Chloride 104 98 - 111 mmol/L   CO2 28 22 -  32 mmol/L   Glucose, Bld 87 70 - 99 mg/dL    Comment: Glucose reference range applies only to samples taken after fasting for at least 8 hours.   BUN 16 6 - 20 mg/dL   Creatinine, Ser 0.17 0.61 - 1.24 mg/dL   Calcium 9.1 8.9 - 51.0 mg/dL   Total Protein 6.4 (L) 6.5 - 8.1 g/dL   Albumin 4.0 3.5 - 5.0 g/dL   AST 16 15 - 41 U/L   ALT 18 0 - 44 U/L   Alkaline Phosphatase 76 38 - 126 U/L   Total Bilirubin 0.8 0.3 - 1.2 mg/dL   GFR, Estimated >25 >85 mL/min    Comment: (NOTE) Calculated using the CKD-EPI Creatinine Equation (2021)    Anion gap 9 5 - 15    Comment: Performed at [facility], [address], [place], [place] [address]    Blood Alcohol level:  Lab Results  Component Value Date   [facility] <10 07/04/2020   ETH <5 08/21/2016    Metabolic Disorder Labs: Lab Results  Component Value Date   HGBA1C 4.5 (L) 07/04/2020   MPG 82.45 07/04/2020   Lab Results  Component Value Date   PROLACTIN 3.7 (L) 07/04/2020   Lab Results  Component Value Date   CHOL 193 07/04/2020   TRIG 62 07/04/2020   HDL 76 07/04/2020   CHOLHDL 2.5 07/04/2020   VLDL 12 07/04/2020   LDLCALC 105 (H) 07/04/2020    Physical Findings: AIMS: Facial and Oral Movements Muscles of Facial Expression: None, normal Lips and Perioral Area: None, normal Jaw: None, normal Tongue: None, normal,Extremity Movements Upper (arms, wrists, hands, fingers): None, normal Lower (legs, knees, ankles, toes): None, normal, Trunk Movements Neck, shoulders, hips: None, normal, Overall Severity Severity of abnormal movements (highest score from questions above): None, normal Incapacitation due to abnormal movements: None, normal Patient's awareness of abnormal movements (rate only patient's report): No Awareness, Dental Status Current  problems with teeth and/or dentures?: No Does patient usually wear dentures?: No  CIWA:    COWS:     Musculoskeletal: Strength & Muscle Tone: within normal limits Gait & Station: normal Patient leans: N/A  Psychiatric Specialty Exam: Physical Exam Vitals and nursing note reviewed.  Constitutional:      Appearance: Normal appearance.  HENT:     Head: Normocephalic and atraumatic.  Pulmonary:     Effort: Pulmonary effort is normal.  Neurological:     General: No focal deficit present.     Mental Status: He is alert and oriented to person, place, and time.     Review of Systems  Blood pressure 138/84, pulse 84, temperature 98.1 F (36.7 C), temperature source Oral, resp. rate 18, height 5\' 8"  (1.727 m), weight 64.4 kg, SpO2 100 %.Body mass index is 21.59 kg/m.  General Appearance: Casual  Eye Contact:  Good  Speech:  Normal Rate  Volume:  Normal  Mood:  Euthymic  Affect:  Congruent  Thought Process:  Coherent and Descriptions of Associations: Intact  Orientation:  Full (Time, Place, and Person)  Thought Content:  Logical  Suicidal Thoughts:  No  Homicidal Thoughts:  No  Memory:  Immediate;   Good Recent;   Good Remote;   Good  Judgement:  Intact  Insight:  Fair  Psychomotor Activity:  Normal  Concentration:  Concentration: Good and Attention Span: Good  Recall:  Good  Fund of Knowledge:  Good  Language:  Good  Akathisia:  Negative  Handed:  Right  AIMS (if indicated):     Assets:  Desire for Improvement Resilience  ADL's:  Intact  Cognition:  WNL  Sleep:  Number of Hours: 6.75     Treatment Plan Summary: Daily contact with patient to assess and evaluate symptoms and progress in treatment, Medication management and Plan : Patient is seen and examined.  Patient is a 24 year old male with the above-stated past psychiatric history who is seen in follow-up.   Diagnosis: 1. Schizoaffective disorder; bipolar type versus bipolar disorder, most recently manic,  severe with psychotic features.  Pertinent findings on examination today: 1.  Mood symptoms continue to improve. 2.  No evidence of mania. 3.  Sleep was much improved with increased dosage of Zyprexa and stopping Latuda. 4.  Laboratories were all normal.  Plan: 1.  Continue Zyprexa 15 mg p.o. nightly for mood stability, sleep and psychosis. 2.  Continue trazodone 100 mg p.o. nightly as needed insomnia. 3.  Continue Depakote DR 750 mg p.o. every afternoon for mood stability. 4.  Continue Metformin 500 mg p.o. daily with food and attempt to control weight gain from medications. 5.  Disposition planning-in progress, hopefully tomorrow.  Antonieta PertGreg Lawson Amontae Ng, MD 07/10/2020, 1:08 PM

## 2020-07-10 NOTE — Progress Notes (Signed)
Adult Psychoeducational Group Note  Date:  07/10/2020 Time:  12:49 AM  Group Topic/Focus:  Wrap-Up Group:   The focus of this group is to help patients review their daily goal of treatment and discuss progress on daily workbooks.  Participation Level:  Active  Participation Quality:  Appropriate  Affect:  Appropriate  Cognitive:  Appropriate  Insight: Appropriate  Engagement in Group:  Engaged  Modes of Intervention:  Discussion  Additional Comments:  Pt attend wrap up group. His day was a 10. The one positive thing that happen to him there was a lot staff here to help. He enjoyed the group and the staff here to help him.   Charna Busman Long 07/10/2020, 12:49 AM

## 2020-07-10 NOTE — Progress Notes (Signed)
SPIRITUALITY GROUP NOTE  Pt attended spirituality group facilitated by Wilkie Aye, MDIv, BCC.  Group Description:  Group focused on topic of hope.  Patients participated in facilitated discussion around topic, connecting with one another around experiences and definitions for hope.  Group members engaged with visual explorer photos, reflecting on what hope looks like for them today.  Group engaged in discussion around how their definitions of hope are present today in hospital.   Modalities: Psycho-social ed, Adlerian, Narrative, MI  Patient Progress:   Present throughout group - actively engaged in discussion.  Noted that he had felt more hope and peace since visiting his brother in [place].  Connected to others describing "perspective, openness, growth" as hopeful.  He described several music genres that bring him hope

## 2020-07-10 NOTE — Progress Notes (Signed)
   07/10/20 2145  Psych Admission Type (Psych Patients Only)  Admission Status Voluntary  Psychosocial Assessment  Patient Complaints None  Eye Contact Fair  Facial Expression Other (Comment) (appropriate)  Affect Appropriate to circumstance  Speech Logical/coherent  Interaction Assertive  Motor Activity Other (Comment) (WNL)  Appearance/Hygiene Unremarkable  Behavior Characteristics Cooperative  Mood Pleasant  Thought Process  Coherency WDL  Content Paranoia  Delusions Paranoid  Perception WDL  Hallucination None reported or observed  Judgment Poor  Confusion None  Danger to Self  Current suicidal ideation? Denies  Danger to Others  Danger to Others None reported or observed

## 2020-07-11 DIAGNOSIS — F3164 Bipolar disorder, current episode mixed, severe, with psychotic features: Secondary | ICD-10-CM | POA: Diagnosis present

## 2020-07-11 DIAGNOSIS — F250 Schizoaffective disorder, bipolar type: Secondary | ICD-10-CM

## 2020-07-11 DIAGNOSIS — F312 Bipolar disorder, current episode manic severe with psychotic features: Secondary | ICD-10-CM | POA: Diagnosis present

## 2020-07-11 MED ORDER — TRAZODONE HCL 100 MG PO TABS: 100.0000 mg | ORAL_TABLET | Freq: Every evening | ORAL | 0 refills | Status: DC | PRN

## 2020-07-11 MED ORDER — METFORMIN HCL 500 MG PO TABS: 500.0000 mg | ORAL_TABLET | Freq: Every day | ORAL | 0 refills | Status: DC

## 2020-07-11 MED ORDER — OLANZAPINE 15 MG PO TBDP: 15.0000 mg | ORAL_TABLET | Freq: Every day | ORAL | 2 refills | Status: DC

## 2020-07-11 MED ORDER — DIVALPROEX SODIUM 250 MG PO DR TAB: 750.0000 mg | DELAYED_RELEASE_TABLET | Freq: Every day | ORAL | 0 refills | Status: DC

## 2020-07-11 NOTE — Progress Notes (Signed)
  [facility] Adult Case Management Discharge Plan :  Will you be returning to the same living situation after discharge:  Yes,  with mother At discharge, do you have transportation home?: Yes,  family Do you have the ability to pay for your medications: Yes,  insurance  Release of information consent forms completed and emailed to Medical Records, then turned in to Medical Records by CSW.   Patient to Follow up at:  Follow-up Information    [facility] Follow up on 07/20/2020.   Why: You have an assessment appointment on 07/20/20 at 11:00 am for therapy and medication management services.  This appointment will be held Virtually. Contact information: [address] [place] [address] [phone]               Next level of care provider has access to [facility] Link:no  Safety Planning and Suicide Prevention discussed: Yes,  with mother     Has patient been referred to the Quitline?: N/A patient is not a smoker  Patient has been referred for addiction treatment: N/A  Lynnell Chad, LCSW 07/11/2020, 9:46 AM

## 2020-07-11 NOTE — Discharge Summary (Signed)
Physician Discharge Summary Note  Patient:  Nathan Holloway is an 24 y.o., male MRN:  [id number] DOB:  [birth year] Patient phone:  [phone] (home)  Patient address:   [place] [address],  Total Time spent with patient: 35 minutes  Date of Admission:  07/05/2020 Date of Discharge: 07/11/2020  Reason for Admission:  Mania with psychosis  Principal Problem: Schizoaffective disorder, bipolar type [facility]) Discharge Diagnoses: Principal Problem:   Schizoaffective disorder, bipolar type ([place]) Active Problems:   Bipolar affective disorder, mixed, severe, with psychotic behavior ([place])   Past Psychiatric History: schizoaffective d/o vs bipolar d/o  Past Medical History:  Past Medical History:  Diagnosis Date  . Manic behavior ([place])   . Psychotic affective disorder ([place]) 08/30/2015   History reviewed. No pertinent surgical history. Family History: History reviewed. No pertinent family history. Family Psychiatric  History: none Social History:  Social History   Substance and Sexual Activity  Alcohol Use No     Social History   Substance and Sexual Activity  Drug Use Not Currently  . Types: Marijuana   Comment: recent usage while out of town this week    Social History   Socioeconomic History  . Marital status: Single    Spouse name: Not on file  . Number of children: Not on file  . Years of education: Not on file  . Highest education level: Not on file  Occupational History  . Not on file  Tobacco Use  . Smoking status: Never Smoker  . Smokeless tobacco: Never Used  Vaping Use  . Vaping Use: Every day  . Substances: Nicotine, Flavoring  Substance and Sexual Activity  . Alcohol use: No  . Drug use: Not Currently    Types: Marijuana    Comment: recent usage while out of town this week  . Sexual activity: Not Currently  Other Topics Concern  . Not on file  Social History Narrative  . Not on file   Social Determinants of Health   Financial  Resource Strain:   . Difficulty of Paying Living Expenses: Not on file  Food Insecurity:   . Worried About Charity fundraiser in the Last Year: Not on file  . Ran Out of Food in the Last Year: Not on file  Transportation Needs:   . Lack of Transportation (Medical): Not on file  . Lack of Transportation (Non-Medical): Not on file  Physical Activity:   . Days of Exercise per Week: Not on file  . Minutes of Exercise per Session: Not on file  Stress:   . Feeling of Stress : Not on file  Social Connections:   . Frequency of Communication with Friends and Family: Not on file  . Frequency of Social Gatherings with Friends and Family: Not on file  . Attends Religious Services: Not on file  . Active Member of Clubs or Organizations: Not on file  . Attends Archivist Meetings: Not on file  . Marital Status: Not on file    Hospital Course:   On admission 11/15: Patient is seen and examined. Patient is a 24 year old male with a past psychiatric history significant for bipolar disorder who presented to the [facility] on 07/04/2020 with his mother. The patient has a history of bipolar disorder and was recently noncompliant with medications. He stated that he had asked his nurse practitioner several months ago to wean him off the medications. He had recently gone to visit friends in [place], and when  he came back appeared to be psychotic. He denied suicidal ideation, homicidal ideation or auditory or visual hallucinations. He was somewhat paranoid. He did have pressured speech and it was somewhat rapid. He was admitted to the hospital for evaluation and stabilization. He has had 4 previous psychiatric hospitalizations. He had 2 hospitalizations in [place] and 2 in [place]. He had previously been prescribed Zyprexa 15 mg p.o. nightly and Depakote DR 500 mg p.o. nightly. His mother revealed that he had been doing very well on his medications, and  was attending college. 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 He stated his main concern is weight gain with the Zyprexa, and he feels as though "I am in the best shape of my life". He does not want to gain weight on medications. He previously was treated with Risperdal, and stated he was allergic to it and that "it almost killed me". He currently denies auditory or visual hallucinations. He denied suicidal or homicidal ideation.  Medications:  Loading dose of Depakote DR 500 mg BID, Latuda 40 mg daily, Zyprexa 5 mg at bedtime daily, hydroxyzine and Trazodone PRN anxiety/sleep.  11/16: Patient is a 24 year old male with a past psychiatric history significant for schizoaffective disorder; bipolar type versus bipolar disorder admitted on 07/06/2020 secondary to manic behavior and noncompliance with medications.  Objective: Patient is seen and examined.  Patient is a 24 year old male with the above-stated past psychiatric history who is seen in follow-up.  He is slightly better today.  He still remains very somatic and wanted to have multiple laboratories and examinations done for his multiple physical complaints.  He was more easily redirected towards his psychiatric issues.  He remains paranoid, and still with the degree of tangentiality.  His speech is pressured, but certainly not as bad as yesterday.  His vital signs are stable, he is afebrile.  He only slept 4.75 hours last night.  Social work contacted his mother who reported that the patient had been off his medications for approximately the last 6 months.  His mother felt as though the patient had moved into an apartment on his own approximately 6 months ago, and his previous psychiatrist apparently had retired as well.  He was given Depakote DR 500 mg p.o. twice daily yesterday, and was to start the Depakote DR 500 mg p.o. q. at bedtime tonight.  We did switch him to [place] yesterday because  of his fears and concerns about weight gain.  He continues to state that he is in "the best condition in his life".  He did receive Zyprexa 5 mg p.o. nightly last night in transition.  Laboratories from 11/15 again showed a mildly elevated creatinine at 1.27, but the rest of his electrolytes were normal.  His hemoglobin A1c was 4.5.  Prolactin was 3.7.  Drug screen was negative.  Medications:  Increase Latuda 40 mg to 60 mg daily, increase Zyprexa to 10 mg daily, continue Depakote 500 mg BID, continuehydroxyzine PRN anxiety/sleep and increase Trazodone 50 mg PRN sleep to 100 mg  11/17: Patient is a 24 year old male with a past psychiatric history significant for schizoaffective disorder; bipolar type versus bipolar disorder admitted on 07/06/2020 secondary to manic behavior and noncompliance with medications.  Objective: Patient is seen and examined.  Patient is a 24 year old male with the above-stated past psychiatric history seen in follow-up.  He is less somatically oriented today.  He is still focused on weight gain.  He is  concerned about having to continue to take the Zyprexa given weight gain in the past.  He also is now concerned about weight gain with the Depakote.  I discussed the fact that we had to continue the Zyprexa at least in the short run because of his lack of sleep.  His Latuda was increased yesterday.  We discussed the potential for changing the Depakote after discharge should he begin to gain any weight.  He denied any auditory or visual hallucinations.  He denied any suicidal ideation.  His vital signs are stable, he is afebrile.  His metabolic panel from 40/97 again had the mildly elevated creatinine at 1.27, but was otherwise normal.  Again, review of his previous laboratories revealed his creatinine 3 months ago to be 1.51, 4 days ago 1.27, and 2 days ago 1.27.  He did sleep 6 hours last night.    Medications:  Decrease Zyprexa to 5 mg daily at bedtime, increase Depakote DR to  750 mg daily vs BID, increase Latuda to 80 mg daily, continue Trazodone and hydroxyzine PRN sleep and anxiety.  11/18: Patient is a 24 year old male with a past psychiatric history significant for schizoaffective disorder; bipolar type versus bipolar disorder admitted on 07/06/2020 secondary to manic behavior and noncompliance with medications.  Objective: Patient is seen and examined.  Patient is a 24 year old male with the above-stated past psychiatric history who is seen in follow-up.  From a mood and psychotic standpoint he is clearly improving.  He went outside today and did quite well.  No new laboratories.  His vital signs are stable, he is afebrile.  Despite the fact of increasing his medications his sleep is still not great.  He got 5.45 hours last night.  His Depakote was consolidated to 750 mg p.o. every afternoon rather than 500 mg twice daily.  He also received 120 mg of Latuda both yesterday and today.  Unfortunately his sleep is still not great with the olanzapine at 5 mg p.o. nightly.  We discussed the potential of adding Metformin to his medication regimen to be able to reduce his appetite and hopefully lead to weight control.  It does appear that the [place] may not be fully effective without Zyprexa to control his illness.  I emphasized that we would stop the Latuda and increase his Zyprexa back up to his previous dosage of 15 mg which had been successful.  We discussed the side effects of Metformin and the dosage range.  We also discussed the fact of a brand-new medication which is a combination drug with Zyprexa and a new antagonist that controls weight gain, and that his insurance would approve this medicine if the Metformin failed to control his weight.  He denied any auditory or visual hallucinations.  He is certainly nowhere as somatic as he had been.  No racing thoughts, no pressured speech.  No suicidal or homicidal ideation.  Medications: Discontinue Latuda, increase Zyprexa to  15 mg daily at bedtime, continue Depakote 750 mg daily, start metformin 500 mg daily   11/19: Patient is a 24 year old male with a past psychiatric history significant for schizoaffective disorder; bipolar type versus bipolar disorder admitted on 07/06/2020 secondary to manic behavior and noncompliance with medications.  Objective: Patient is seen and examined.  Patient is a 24 year old male with the above-stated past psychiatric history who is seen in follow-up.  He is doing well.  He tolerated the Zyprexa last night.  He stated he got the best sleep that he had had in  quite a while.  He slept over 6 hours.  He denied any auditory or visual hallucinations.  He denied any suicidal or homicidal ideation.  He is not euphoric.  His speech is not pressured.  He took the Metformin this morning, and denied any side effects to it.  His laboratories from this a.m. showed normal electrolytes including liver function enzymes.  His CBC was normal.  His valproic acid level was 61.  We discussed planning discharge hopefully for tomorrow if he continues to do well on his current dosage of Zyprexa and Depakote.  Medications: Continue medication regiment, no changes  11/20: Patient has met maximum benefit of hospitalization.  No suicidal/homicidal ideations, hallucinations, or substance issues.  Discharge instructions provided with explanations along with Rx, follow up appointment, and crisis numbers.  Psychiatrically stable for discharge.  Physical Findings: AIMS: Facial and Oral Movements Muscles of Facial Expression: None, normal Lips and Perioral Area: None, normal Jaw: None, normal Tongue: None, normal,Extremity Movements Upper (arms, wrists, hands, fingers): None, normal Lower (legs, knees, ankles, toes): None, normal, Trunk Movements Neck, shoulders, hips: None, normal, Overall Severity Severity of abnormal movements (highest score from questions above): None, normal Incapacitation due to abnormal  movements: None, normal Patient's awareness of abnormal movements (rate only patient's report): No Awareness, Dental Status Current problems with teeth and/or dentures?: No Does patient usually wear dentures?: No  CIWA:    COWS:     Musculoskeletal: Strength & Muscle Tone: within normal limits Gait & Station: normal Patient leans: N/A  Psychiatric Specialty Exam: Physical Exam Vitals and nursing note reviewed.  Constitutional:      Appearance: Normal appearance.  HENT:     Head: Normocephalic.     Nose: Nose normal.  Pulmonary:     Effort: Pulmonary effort is normal.  Musculoskeletal:        General: Normal range of motion.     Cervical back: Normal range of motion.  Neurological:     General: No focal deficit present.     Mental Status: He is alert and oriented to person, place, and time.  Psychiatric:        Attention and Perception: Attention and perception normal.        Mood and Affect: Mood is anxious.        Speech: Speech normal.        Behavior: Behavior normal. Behavior is cooperative.        Thought Content: Thought content normal.        Cognition and Memory: Cognition and memory normal.        Judgment: Judgment normal.     Review of Systems  Psychiatric/Behavioral: The patient is nervous/anxious.   All other systems reviewed and are negative.   Blood pressure 138/84, pulse 84, temperature 98.1 F (36.7 C), temperature source Oral, resp. rate 18, height _0  (1.727 m), weight 64.4 kg, SpO2 100 %.Body mass index is 21.59 kg/m.  General Appearance: Casual  Eye Contact:  Good  Speech:  Normal Rate  Volume:  Normal  Mood:  Anxious  Affect:  Congruent  Thought Process:  Coherent and Descriptions of Associations: Intact  Orientation:  Full (Time, Place, and Person)  Thought Content:  WDL and Logical  Suicidal Thoughts:  No  Homicidal Thoughts:  No  Memory:  Immediate;   Good Recent;   Good Remote;   Good  Judgement:  Fair  Insight:  Fair   Psychomotor Activity:  Normal  Concentration:  Concentration: Good and Attention  Span: Good  Recall:  Good  Fund of Knowledge:  Good  Language:  Good  Akathisia:  No  Handed:  Right  AIMS (if indicated):     Assets:  Housing Leisure Time Physical Health Resilience Social Support  ADL's:  Intact  Cognition:  WNL  Sleep:  Number of Hours: 6.75        Has this patient used any form of tobacco in the last 30 days? (Cigarettes, Smokeless Tobacco, Cigars, and/or Pipes) Yes, Yes, A prescription for an FDA-approved tobacco cessation medication was offered at discharge and the patient refused  Blood Alcohol level:  Lab Results  Component Value Date   [facility] <10 07/04/2020   ETH <5 94/17/4081    Metabolic Disorder Labs:  Lab Results  Component Value Date   HGBA1C 4.5 (L) 07/04/2020   MPG 82.45 07/04/2020   Lab Results  Component Value Date   PROLACTIN 3.7 (L) 07/04/2020   Lab Results  Component Value Date   CHOL 193 07/04/2020   TRIG 62 07/04/2020   HDL 76 07/04/2020   CHOLHDL 2.5 07/04/2020   VLDL 12 07/04/2020   LDLCALC 105 (H) 07/04/2020    See Psychiatric Specialty Exam and Suicide Risk Assessment completed by Attending Physician prior to discharge.  Discharge destination:  Home  Is patient on multiple antipsychotic therapies at discharge:  No   Has Patient had three or more failed trials of antipsychotic monotherapy by history:  No  Recommended Plan for Multiple Antipsychotic Therapies: NA  Discharge Instructions    Diet - low sodium heart healthy   Complete by: As directed    Discharge instructions   Complete by: As directed    Follow up with outpatient appointment this week   Increase activity slowly   Complete by: As directed      Allergies as of 07/11/2020      Reactions   Risperidone And Related Other (See Comments)   Diastatic      Medication List    TAKE these medications     Indication  divalproex 250 MG DR tablet Commonly known as:  DEPAKOTE Take 3 tablets (750 mg total) by mouth daily with supper.  Indication: mood disorder   metFORMIN 500 MG tablet Commonly known as: GLUCOPHAGE Take 1 tablet (500 mg total) by mouth daily with breakfast. Start taking on: July 12, 2020  Indication: Type 2 Diabetes   olanzapine zydis 15 MG disintegrating tablet Commonly known as: ZYPREXA Take 1 tablet (15 mg total) by mouth at bedtime.  Indication: Manic Phase of Manic-Depression   traZODone 100 MG tablet Commonly known as: DESYREL Take 1 tablet (100 mg total) by mouth at bedtime as needed for sleep.  Indication: [facility] Follow up on 07/20/2020.   Why: You have an assessment appointment on 07/20/20 at 11:00 am for therapy and medication management services.  This appointment will be held Virtually. Contact information: [address] [place] [address] [phone]               Follow-up recommendations:  Activity:  as tolerated Diet:  heart healthy diet  Comments:  Follow up with outpatient appointment above  Signed: Waylan Boga, NP 07/11/2020, 9:32 AM

## 2020-07-11 NOTE — Progress Notes (Signed)
progress note  This Clinical research associate spoke with the pt's mother and she asked if the pt could be picked up at 1600 given the mother working and not being able to pick them up until then

## 2020-07-11 NOTE — Plan of Care (Signed)
Discharge note  Patient verbalizes readiness for discharge. Follow up plan explained, AVS, Transition record and SRA given. Prescriptions and teaching provided. Belongings returned and signed for. Suicide safety plan completed and signed. Patient verbalizes understanding. Patient denies SI/HI and assures this Clinical research associate they will seek assistance should that change. Patient discharged to lobby where mother was waiting.  Problem: Education: Goal: Knowledge of Nathan Holloway General Education information/materials will improve Outcome: Adequate for Discharge Goal: Emotional status will improve Outcome: Adequate for Discharge Goal: Mental status will improve Outcome: Adequate for Discharge Goal: Verbalization of understanding the information provided will improve Outcome: Adequate for Discharge   Problem: Activity: Goal: Interest or engagement in activities will improve Outcome: Adequate for Discharge Goal: Sleeping patterns will improve Outcome: Adequate for Discharge   Problem: Coping: Goal: Ability to verbalize frustrations and anger appropriately will improve Outcome: Adequate for Discharge Goal: Ability to demonstrate self-control will improve Outcome: Adequate for Discharge   Problem: Health Behavior/Discharge Planning: Goal: Identification of resources available to assist in meeting health care needs will improve Outcome: Adequate for Discharge Goal: Compliance with treatment plan for underlying cause of condition will improve Outcome: Adequate for Discharge   Problem: Physical Regulation: Goal: Ability to maintain clinical measurements within normal limits will improve Outcome: Adequate for Discharge   Problem: Safety: Goal: Periods of time without injury will increase Outcome: Adequate for Discharge   Problem: Activity: Goal: Will verbalize the importance of balancing activity with adequate rest periods Outcome: Adequate for Discharge   Problem: Education: Goal: Will be  free of psychotic symptoms Outcome: Adequate for Discharge Goal: Knowledge of the prescribed therapeutic regimen will improve Outcome: Adequate for Discharge   Problem: Coping: Goal: Coping ability will improve Outcome: Adequate for Discharge Goal: Will verbalize feelings Outcome: Adequate for Discharge   Problem: Health Behavior/Discharge Planning: Goal: Compliance with prescribed medication regimen will improve Outcome: Adequate for Discharge   Problem: Nutritional: Goal: Ability to achieve adequate nutritional intake will improve Outcome: Adequate for Discharge   Problem: Role Relationship: Goal: Ability to communicate needs accurately will improve Outcome: Adequate for Discharge Goal: Ability to interact with others will improve Outcome: Adequate for Discharge   Problem: Safety: Goal: Ability to redirect hostility and anger into socially appropriate behaviors will improve Outcome: Adequate for Discharge Goal: Ability to remain free from injury will improve Outcome: Adequate for Discharge   Problem: Self-Care: Goal: Ability to participate in self-care as condition permits will improve Outcome: Adequate for Discharge   Problem: Self-Concept: Goal: Will verbalize positive feelings about self Outcome: Adequate for Discharge   Problem: Education: Goal: Ability to state activities that reduce stress will improve Outcome: Adequate for Discharge   Problem: Coping: Goal: Ability to identify and develop effective coping behavior will improve Outcome: Adequate for Discharge   Problem: Self-Concept: Goal: Ability to identify factors that promote anxiety will improve Outcome: Adequate for Discharge Goal: Level of anxiety will decrease Outcome: Adequate for Discharge Goal: Ability to modify response to factors that promote anxiety will improve Outcome: Adequate for Discharge

## 2020-07-11 NOTE — [facility] Suicide Risk Assessment (Signed)
[facility] Discharge Suicide Risk Assessment   Principal Problem: Schizoaffective disorder, bipolar type (HCC) Discharge Diagnoses: Principal Problem:   Schizoaffective disorder, bipolar type (HCC) Active Problems:   Bipolar affective disorder, mixed, severe, with psychotic behavior ([facility])   Total Time spent with patient: 20 minutes  Musculoskeletal: Strength & Muscle Tone: within normal limits Gait & Station: normal Patient leans: N/A  Psychiatric Specialty Exam: Review of Systems  All other systems reviewed and are negative.   Blood pressure 138/84, pulse 84, temperature 98.1 F (36.7 C), temperature source Oral, resp. rate 18, height 5\' 8"  (1.727 m), weight 64.4 kg, SpO2 100 %.Body mass index is 21.59 kg/m.  General Appearance: Casual  Eye Contact::  Good  Speech:  Normal Rate409  Volume:  Normal  Mood:  Euthymic  Affect:  Congruent  Thought Process:  Coherent and Descriptions of Associations: Intact  Orientation:  Full (Time, Place, and Person)  Thought Content:  Logical  Suicidal Thoughts:  No  Homicidal Thoughts:  No  Memory:  Immediate;   Good Recent;   Good Remote;   Good  Judgement:  Intact  Insight:  Fair  Psychomotor Activity:  Normal  Concentration:  Good  Recall:  Good  Fund of Knowledge:Good  Language: Good  Akathisia:  Negative  Handed:  Right  AIMS (if indicated):     Assets:  Communication Skills Desire for Improvement Financial Resources/Insurance Housing Resilience Social Support Talents/Skills Transportation Vocational/Educational  Sleep:  Number of Hours: 6.75  Cognition: WNL  ADL's:  Intact   Mental Status Per Nursing Assessment::   On Admission:  NA  Demographic Factors:  Male  Loss Factors: Financial problems/change in socioeconomic status  Historical Factors: Impulsivity  Risk Reduction Factors:   Positive social support and Positive coping skills or problem solving skills  Continued Clinical Symptoms:  Bipolar Disorder:    Mixed State  Cognitive Features That Contribute To Risk:  None    Suicide Risk:  Minimal: No identifiable suicidal ideation.  Patients presenting with no risk factors but with morbid ruminations; may be classified as minimal risk based on the severity of the depressive symptoms   Follow-up Information    [facility] Follow up on 07/20/2020.   Why: You have an assessment appointment on 07/20/20 at 11:00 am for therapy and medication management services.  This appointment will be held Virtually. Contact information: [address] [place] [phone]               Plan Of Care/Follow-up recommendations:  Activity:  ad lib  [phone], MD 07/11/2020, 10:26 AM

## 2020-07-11 NOTE — Discharge Instructions (Signed)
Follow up with outpatient provider this week.

## 2020-08-06 ENCOUNTER — Other Ambulatory Visit: Payer: Self-pay

## 2020-08-06 ENCOUNTER — Ambulatory Visit (HOSPITAL_COMMUNITY)
Admission: EM | Admit: 2020-08-06 | Discharge: 2020-08-07 | Disposition: A | Discharge status: Other Acute Inpatient Hospital | Payer: BC Managed Care – PPO | Attending: Registered Nurse | Admitting: Registered Nurse

## 2020-08-06 ENCOUNTER — Encounter (HOSPITAL_COMMUNITY): Payer: Self-pay | Admitting: Registered Nurse

## 2020-08-06 DIAGNOSIS — F3164 Bipolar disorder, current episode mixed, severe, with psychotic features: Secondary | ICD-10-CM | POA: Insufficient documentation

## 2020-08-06 DIAGNOSIS — Z20822 Contact with and (suspected) exposure to covid-19: Secondary | ICD-10-CM | POA: Diagnosis not present

## 2020-08-06 DIAGNOSIS — F250 Schizoaffective disorder, bipolar type: Secondary | ICD-10-CM | POA: Diagnosis present

## 2020-08-06 DIAGNOSIS — Z9114 Patient's other noncompliance with medication regimen: Secondary | ICD-10-CM | POA: Diagnosis not present

## 2020-08-06 LAB — COMPREHENSIVE METABOLIC PANEL
ALT: 14 U/L (ref 0–44)
AST: 18 U/L (ref 15–41)
Albumin: 4.7 g/dL (ref 3.5–5.0)
Alkaline Phosphatase: 78 U/L (ref 38–126)
Anion gap: 16 — ABNORMAL HIGH (ref 5–15)
BUN: 16 mg/dL (ref 6–20)
CO2: 21 mmol/L — ABNORMAL LOW (ref 22–32)
Calcium: 10.3 mg/dL (ref 8.9–10.3)
Chloride: 102 mmol/L (ref 98–111)
Creatinine, Ser: 1.43 mg/dL — ABNORMAL HIGH (ref 0.61–1.24)
GFR, Estimated: >60 mL/min (ref >60)
Glucose, Bld: 88 mg/dL (ref 70–99)
Potassium: 3.9 mmol/L (ref 3.5–5.1)
Sodium: 139 mmol/L (ref 135–145)
Total Bilirubin: 1 mg/dL (ref 0.3–1.2)
Total Protein: 8 g/dL (ref 6.5–8.1)

## 2020-08-06 LAB — VALPROIC ACID LEVEL: Valproic Acid Lvl: 27 ug/mL — ABNORMAL LOW (ref 50.0–100.0)

## 2020-08-06 LAB — CBC WITH DIFFERENTIAL/PLATELET
Abs Immature Granulocytes: 0.05 10*3/uL (ref 0.00–0.07)
Basophils Absolute: 0.1 10*3/uL (ref 0.0–0.1)
Basophils Relative: 1 %
Eosinophils Absolute: 0.2 10*3/uL (ref 0.0–0.5)
Eosinophils Relative: 1 %
HCT: 49 % (ref 39.0–52.0)
Hemoglobin: 15.8 g/dL (ref 13.0–17.0)
Immature Granulocytes: 1 %
Lymphocytes Relative: 23 %
Lymphs Abs: 2.5 10*3/uL (ref 0.7–4.0)
MCH: 28.5 pg (ref 26.0–34.0)
MCHC: 32.2 g/dL (ref 30.0–36.0)
MCV: 88.3 fL (ref 80.0–100.0)
Monocytes Absolute: 1 10*3/uL (ref 0.1–1.0)
Monocytes Relative: 9 %
Neutro Abs: 7.3 10*3/uL (ref 1.7–7.7)
Neutrophils Relative %: 65 %
Platelets: 278 10*3/uL (ref 150–400)
RBC: 5.55 MIL/uL (ref 4.22–5.81)
RDW: 12.7 % (ref 11.5–15.5)
WBC: 11.1 10*3/uL — ABNORMAL HIGH (ref 4.0–10.5)
nRBC: 0 % (ref 0.0–0.2)

## 2020-08-06 LAB — POCT URINALYSIS DIP (DEVICE)
Bilirubin Urine: NEGATIVE
Glucose, UA: NEGATIVE mg/dL
Hgb urine dipstick: NEGATIVE
Ketones, ur: NEGATIVE mg/dL
Leukocytes,Ua: NEGATIVE
Nitrite: NEGATIVE
Protein, ur: NEGATIVE mg/dL
Specific Gravity, Urine: >=1.03 (ref 1.005–1.030)
Urobilinogen, UA: 0.2 mg/dL (ref 0.0–1.0)
pH: 6 (ref 5.0–8.0)

## 2020-08-06 LAB — HEMOGLOBIN A1C
Hgb A1c MFr Bld: 4.6 % — ABNORMAL LOW (ref 4.8–5.6)
Mean Plasma Glucose: 85.32 mg/dL

## 2020-08-06 LAB — POC SARS CORONAVIRUS 2 AG: SARS Coronavirus 2 Ag: NEGATIVE

## 2020-08-06 LAB — RESP PANEL BY RT-PCR (FLU A&B, COVID) ARPGX2
Influenza A by PCR: NEGATIVE
Influenza B by PCR: NEGATIVE
SARS Coronavirus 2 by RT PCR: NEGATIVE

## 2020-08-06 LAB — ETHANOL: Alcohol, Ethyl (B): <10 mg/dL (ref <10)

## 2020-08-06 MED ORDER — DIVALPROEX SODIUM 500 MG PO DR TAB
750.0000 mg | DELAYED_RELEASE_TABLET | Freq: Every day | ORAL | Status: DC
  Administered 2020-08-07: 750 mg via ORAL
  Filled 2020-08-06 (×2): qty 1

## 2020-08-06 MED ORDER — DIPHENHYDRAMINE HCL 50 MG/ML IJ SOLN: 25.0000 mg | Freq: Once | INTRAMUSCULAR | Status: DC

## 2020-08-06 MED ORDER — LORAZEPAM 1 MG PO TABS
ORAL_TABLET | ORAL | Status: AC
  Filled 2020-08-06: qty 1

## 2020-08-06 MED ORDER — HALOPERIDOL LACTATE 5 MG/ML IJ SOLN: 2.0000 mg | Freq: Once | INTRAMUSCULAR | Status: DC

## 2020-08-06 MED ORDER — DIPHENHYDRAMINE HCL 25 MG PO CAPS: 25.0000 mg | ORAL_CAPSULE | Freq: Once | ORAL | Status: DC

## 2020-08-06 MED ORDER — MAGNESIUM HYDROXIDE 400 MG/5ML PO SUSP: 30.0000 mL | Freq: Every day | ORAL | Status: DC | PRN

## 2020-08-06 MED ORDER — ALUM & MAG HYDROXIDE-SIMETH 200-200-20 MG/5ML PO SUSP: 30.0000 mL | ORAL | Status: DC | PRN

## 2020-08-06 MED ORDER — LORAZEPAM 2 MG/ML IJ SOLN: 1.0000 mg | Freq: Once | INTRAMUSCULAR | Status: DC

## 2020-08-06 MED ORDER — DIPHENHYDRAMINE HCL 25 MG PO CAPS
ORAL_CAPSULE | ORAL | Status: AC
  Filled 2020-08-06: qty 1

## 2020-08-06 MED ORDER — METFORMIN HCL 500 MG PO TABS
500.0000 mg | ORAL_TABLET | Freq: Every day | ORAL | Status: DC
  Administered 2020-08-07: 500 mg via ORAL
  Filled 2020-08-06: qty 1

## 2020-08-06 MED ORDER — ACETAMINOPHEN 325 MG PO TABS: 650.0000 mg | ORAL_TABLET | Freq: Four times a day (QID) | ORAL | Status: DC | PRN

## 2020-08-06 MED ORDER — OLANZAPINE 5 MG PO TBDP
15.0000 mg | ORAL_TABLET | Freq: Every day | ORAL | Status: DC
  Administered 2020-08-06 – 2020-08-07 (×2): 15 mg via ORAL
  Filled 2020-08-06 (×2): qty 1

## 2020-08-06 MED ORDER — HALOPERIDOL LACTATE 5 MG/ML IJ SOLN
INTRAMUSCULAR | Status: AC
  Administered 2020-08-06: 2 mg via INTRAMUSCULAR
  Filled 2020-08-06: qty 1

## 2020-08-06 MED ORDER — HALOPERIDOL 2 MG PO TABS: 2.0000 mg | ORAL_TABLET | Freq: Once | ORAL | Status: DC

## 2020-08-06 MED ORDER — DIPHENHYDRAMINE HCL 50 MG/ML IJ SOLN
INTRAMUSCULAR | Status: AC
  Administered 2020-08-06: 25 mg via INTRAMUSCULAR
  Filled 2020-08-06: qty 1

## 2020-08-06 MED ORDER — LORAZEPAM 2 MG/ML IJ SOLN
INTRAMUSCULAR | Status: AC
  Administered 2020-08-06: 1 mg via INTRAMUSCULAR
  Filled 2020-08-06: qty 1

## 2020-08-06 MED ORDER — TRAZODONE HCL 100 MG PO TABS: 100.0000 mg | ORAL_TABLET | Freq: Every evening | ORAL | Status: DC | PRN

## 2020-08-06 MED ORDER — HALOPERIDOL 2 MG PO TABS
ORAL_TABLET | ORAL | Status: AC
  Filled 2020-08-06: qty 1

## 2020-08-06 MED ORDER — LORAZEPAM 1 MG PO TABS: 1.0000 mg | ORAL_TABLET | Freq: Once | ORAL | Status: DC

## 2020-08-06 NOTE — ED Notes (Signed)
Patient is resting comfortably. 

## 2020-08-06 NOTE — ED Notes (Signed)
Patient talkative and unorganized Delusional and disrespectful  towards staff.

## 2020-08-06 NOTE — ED Triage Notes (Signed)
Nathan Holloway arrived with his mother in a manic state, mom stated non compliance with his medications. He was unable to fill out the [employer].

## 2020-08-06 NOTE — ED Provider Notes (Signed)
Behavioral Health Admission H&P [facility] & OBS)  Date: 08/06/20 Patient Name: Nathan Holloway MRN: [id number] Chief Complaint: No chief complaint on file.     Diagnoses:  Final diagnoses:  Bipolar affective disorder, mixed, severe, with psychotic behavior ([place])  Schizoaffective disorder, bipolar type ([place])    HPI: Nathan Holloway, 24 y.o., male patient presents to [facility] as a walk in accompanied by his mother with complaints of manic behavior.  Patient seen face to face by this provider, consulted with Dr. Serafina Mitchell; and chart reviewed on 08/06/20.  On evaluation Nathan Holloway reports he has not been taking his medications.  "I didn't know I was suppose to take everyday.  Who said I was suppose to?"  Patient mother at side and states that patient has been having manic behavior, not sleeping and getting worse.  Wants to get patient back on medications.  Patient conversation is relevant one minute and irrelevant the next.   During evaluation Nathan Holloway is alert/oriented x 4; calm/cooperative with manic behavior an pressured speech, restlessness.  He does not appear to be responding to internal/external stimuli or delusional thoughts; but has a history of auditory hallucinations.  Patient denies suicidal/self-harm/homicidal ideation, psychosis, and paranoia.  PHQ 2-9:  [place] ED from 08/06/2020 in [facility]  Thoughts that you would be better off dead, or of hurting yourself in some way Several days  [Phreesia 08/06/2020]  PHQ-9 Total Score 15      Flowsheet Row Admission (Discharged) from 07/05/2020 in [place] [facility] ED from 07/04/2020 in [place] No Risk No Risk       Total Time spent with patient: 45 minutes  Musculoskeletal  Strength & Muscle Tone: within normal limits Gait & Station: normal Patient leans: N/A  Psychiatric Specialty Exam  Presentation General  Appearance: Appropriate for Environment; Casual  Eye Contact:Good  Speech:Clear and Coherent; Pressured  Speech Volume:Normal  Handedness:Right   Mood and Affect  Mood:Anxious; Labile  Affect:Congruent   Thought Process  Thought Processes:Coherent; Goal Directed; Irrevelant  Descriptions of Associations:Tangential  Orientation:Full (Time, Place and Person)  Thought Content:Tangential  Hallucinations:Hallucinations: None  Ideas of Reference:None  Suicidal Thoughts:Suicidal Thoughts: No  Homicidal Thoughts:Homicidal Thoughts: No   Sensorium  Memory:Immediate Good; Recent Good  Judgment:Fair  Insight:Present   Executive Functions  Concentration:Fair  Attention Span:Fair  [place] of Knowledge:Good  Language:Good   Psychomotor Activity  Psychomotor Activity:Psychomotor Activity: Restlessness   Assets  Assets:Communication Skills; Desire for Improvement; Housing; Social Support   Sleep  Sleep:Sleep: Fair   Physical Exam Vitals and nursing note reviewed. Exam conducted with a chaperone present.  Constitutional:      General: He is not in acute distress.    Appearance: Normal appearance. He is not ill-appearing.  HENT:     Head: Normocephalic.  Eyes:     Pupils: Pupils are equal, round, and reactive to light.  Cardiovascular:     Rate and Rhythm: Normal rate.  Pulmonary:     Effort: Pulmonary effort is normal.  Musculoskeletal:        General: Normal range of motion.     Cervical back: Normal range of motion.  Skin:    General: Skin is warm and dry.  Neurological:     Mental Status: He is alert and oriented to person, place, and time.  Psychiatric:        Attention and Perception: Attention and perception normal.  Mood and Affect: Mood is anxious.        Speech: Speech is rapid and pressured.        Behavior: Behavior is hyperactive. Behavior is cooperative.        Thought Content: Thought content normal. Thought  content is not delusional. Thought content does not include homicidal or suicidal ideation.        Cognition and Memory: Cognition and memory normal.        Judgment: Judgment is impulsive.    Review of Systems  Constitutional: Negative.   HENT: Negative.   Eyes: Negative.   Respiratory: Negative.   Cardiovascular: Negative.   Gastrointestinal: Negative.   Genitourinary: Negative.   Musculoskeletal: Negative.   Skin: Negative.   Neurological: Negative.   Endo/Heme/Allergies: Negative.   Psychiatric/Behavioral: Negative for hallucinations and suicidal ideas. Depression: Stable. The patient is nervous/anxious and has insomnia.        Patient has been off of medications; and then not taking daily as ordered.  Manic    Blood pressure (!) 169/108, pulse 98, temperature 98.1 F (36.7 C), temperature source Oral, resp. rate 18, SpO2 100 %. There is no height or weight on file to calculate BMI.  Past Psychiatric History: See above    Is the patient at risk to self? No  Has the patient been a risk to self in the past 6 months? No .    Has the patient been a risk to self within the distant past? No   Is the patient a risk to others? No   Has the patient been a risk to others in the past 6 months? No   Has the patient been a risk to others within the distant past? No   Past Medical History:  Past Medical History:  Diagnosis Date  . Manic behavior ([place])   . Psychotic affective disorder ([place]) 08/30/2015   History reviewed. No pertinent surgical history.  Family History: History reviewed. No pertinent family history.  Social History:  Social History   Socioeconomic History  . Marital status: Single    Spouse name: Not on file  . Number of children: Not on file  . Years of education: Not on file  . Highest education level: Not on file  Occupational History  . Not on file  Tobacco Use  . Smoking status: Never Smoker  . Smokeless tobacco: Never Used  Vaping Use  . Vaping Use:  Every day  . Substances: Nicotine, Flavoring  Substance and Sexual Activity  . Alcohol use: No  . Drug use: Not Currently    Types: Marijuana    Comment: recent usage while out of town this week  . Sexual activity: Not Currently  Other Topics Concern  . Not on file  Social History Narrative  . Not on file   Social Determinants of Health   Financial Resource Strain: Not on file  Food Insecurity: Not on file  Transportation Needs: Not on file  Physical Activity: Not on file  Stress: Not on file  Social Connections: Not on file  Intimate Partner Violence: Not on file    SDOH:  SDOH Screenings   Alcohol Screen: Low Risk   . Last Alcohol Screening Score (AUDIT): 0  Depression (PHQ2-9): Medium Risk  . PHQ-2 Score: 15  Financial Resource Strain: Not on file  Food Insecurity: Not on file  Housing: Not on file  Physical Activity: Not on file  Social Connections: Not on file  Stress: Not on file  Tobacco Use: Low Risk   . Smoking Tobacco Use: Never Smoker  . Smokeless Tobacco Use: Never Used  Transportation Needs: Not on file    Last Labs:  Admission on 07/05/2020, Discharged on 07/11/2020  Component Date Value Ref Range Status  . Sodium 07/06/2020 138  135 - 145 mmol/L Final  . Potassium 07/06/2020 4.4  3.5 - 5.1 mmol/L Final  . Chloride 07/06/2020 102  98 - 111 mmol/L Final  . CO2 07/06/2020 27  22 - 32 mmol/L Final  . Glucose, Bld 07/06/2020 77  70 - 99 mg/dL Final   Glucose reference range applies only to samples taken after fasting for at least 8 hours.  . BUN 07/06/2020 16  6 - 20 mg/dL Final  . Creatinine, Ser 07/06/2020 1.27* 0.61 - 1.24 mg/dL Final  . Calcium 07/06/2020 9.4  8.9 - 10.3 mg/dL Final  . Total Protein 07/06/2020 7.2  6.5 - 8.1 g/dL Final  . Albumin 07/06/2020 4.3  3.5 - 5.0 g/dL Final  . AST 07/06/2020 21  15 - 41 U/L Final  . ALT 07/06/2020 25  0 - 44 U/L Final  . Alkaline Phosphatase 07/06/2020 76  38 - 126 U/L Final  . Total Bilirubin  07/06/2020 0.8  0.3 - 1.2 mg/dL Final  . GFR, Estimated 07/06/2020 >60  >60 mL/min Final   Comment: (NOTE) Calculated using the CKD-EPI Creatinine Equation (2021)   . Anion gap 07/06/2020 9  5 - 15 Final   Performed at [facility], [place] [address] [place], [place] [address]  . WBC 07/10/2020 6.2  4.0 - 10.5 K/uL Final  . RBC 07/10/2020 4.95  4.22 - 5.81 MIL/uL Final  . Hemoglobin 07/10/2020 14.4  13.0 - 17.0 g/dL Final  . HCT 07/10/2020 44.6  39.0 - 52.0 % Final  . MCV 07/10/2020 90.1  80.0 - 100.0 fL Final  . MCH 07/10/2020 29.1  26.0 - 34.0 pg Final  . MCHC 07/10/2020 32.3  30.0 - 36.0 g/dL Final  . RDW 07/10/2020 12.6  11.5 - 15.5 % Final  . Platelets 07/10/2020 226  150 - 400 K/uL Final  . nRBC 07/10/2020 0.0  0.0 - 0.2 % Final  . Neutrophils Relative % 07/10/2020 51  % Final  . Neutro Abs 07/10/2020 3.2  1.7 - 7.7 K/uL Final  . Lymphocytes Relative 07/10/2020 38  % Final  . Lymphs Abs 07/10/2020 2.4  0.7 - 4.0 K/uL Final  . Monocytes Relative 07/10/2020 9  % Final  . Monocytes Absolute 07/10/2020 0.6  0.1 - 1.0 K/uL Final  . Eosinophils Relative 07/10/2020 1  % Final  . Eosinophils Absolute 07/10/2020 0.1  0.0 - 0.5 K/uL Final  . Basophils Relative 07/10/2020 1  % Final  . Basophils Absolute 07/10/2020 0.0  0.0 - 0.1 K/uL Final  . Immature Granulocytes 07/10/2020 0  % Final  . Abs Immature Granulocytes 07/10/2020 0.01  0.00 - 0.07 K/uL Final   Performed at [facility], [place] [address] [place], [place] [address]  . Valproic Acid Lvl 07/10/2020 61  50.0 - 100.0 ug/mL Final   Performed at [facility], [place] [address], [place], [place] [address]  . Sodium 07/10/2020 141  135 - 145 mmol/L Final  . Potassium 07/10/2020 4.2  3.5 - 5.1 mmol/L Final  . Chloride 07/10/2020 104  98 - 111 mmol/L Final  . CO2 07/10/2020 28  22 - 32 mmol/L Final  . Glucose, Bld 07/10/2020 87  70 - 99 mg/dL Final   Glucose reference range applies  only to samples taken after fasting for at least 8 hours.  . BUN 07/10/2020 16  6 - 20 mg/dL Final  . Creatinine, Ser 07/10/2020 1.21  0.61 - 1.24 mg/dL Final  . Calcium 07/10/2020 9.1  8.9 - 10.3 mg/dL Final  . Total Protein 07/10/2020 6.4* 6.5 - 8.1 g/dL Final  . Albumin 07/10/2020 4.0  3.5 - 5.0 g/dL Final  . AST 07/10/2020 16  15 - 41 U/L Final  . ALT 07/10/2020 18  0 - 44 U/L Final  . Alkaline Phosphatase 07/10/2020 76  38 - 126 U/L Final  . Total Bilirubin 07/10/2020 0.8  0.3 - 1.2 mg/dL Final  . GFR, Estimated 07/10/2020 >60  >60 mL/min Final   Comment: (NOTE) Calculated using the CKD-EPI Creatinine Equation (2021)   . Anion gap 07/10/2020 9  5 - 15 Final   Performed at [facility], [place] [address], [place], [place] [address]  Admission on 07/04/2020, Discharged on 07/05/2020  Component Date Value Ref Range Status  . SARS Coronavirus 2 by RT PCR 07/04/2020 NEGATIVE  NEGATIVE Final   Comment: (NOTE) SARS-CoV-2 target nucleic acids are NOT DETECTED.  The SARS-CoV-2 RNA is generally detectable in upper respiratoy specimens during the acute phase of infection. The lowest concentration of SARS-CoV-2 viral copies this assay can detect is 131 copies/mL. A negative result does not preclude SARS-Cov-2 infection and should not be used as the sole basis for treatment or other patient management decisions. A negative result may occur with  improper specimen collection/handling, submission of specimen other than nasopharyngeal swab, presence of viral mutation(s) within the areas targeted by this assay, and inadequate number of viral copies (<131 copies/mL). A negative result must be combined with clinical observations, patient history, and epidemiological information. The expected result is Negative.  Fact Sheet for Patients:  PinkCheek.be  Fact Sheet for Healthcare Providers:  GravelBags.it  This test is  no                          t yet approved or cleared by the [place] FDA and  has been authorized for detection and/or diagnosis of SARS-CoV-2 by FDA under an Emergency Use Authorization (EUA). This EUA will remain  in effect (meaning this test can be used) for the duration of the COVID-19 declaration under Section 564(b)(1) of the Act, 21 U.S.C. section 360bbb-3(b)(1), unless the authorization is terminated or revoked sooner.    . Influenza A by PCR 07/04/2020 NEGATIVE  NEGATIVE Final  . Influenza B by PCR 07/04/2020 NEGATIVE  NEGATIVE Final   Comment: (NOTE) The Xpert Xpress SARS-CoV-2/FLU/RSV assay is intended as an aid in  the diagnosis of influenza from Nasopharyngeal swab specimens and  should not be used as a sole basis for treatment. Nasal washings and  aspirates are unacceptable for Xpert Xpress SARS-CoV-2/FLU/RSV  testing.  Fact Sheet for Patients: PinkCheek.be  Fact Sheet for Healthcare Providers: GravelBags.it  This test is not yet approved or cleared by the [place] FDA and  has been authorized for detection and/or diagnosis of SARS-CoV-2 by  FDA under an Emergency Use Authorization (EUA). This EUA will remain  in effect (meaning this test can be used) for the duration of the  Covid-19 declaration under Section 564(b)(1) of the Act, 21  U.S.C. section 360bbb-3(b)(1), unless the authorization is  terminated or revoked. Performed at [facility],  [address] [place], [place] [address]   . SARS Coronavirus 2 Ag 07/04/2020 Negative  Negative Preliminary  . WBC 07/04/2020 9.8  4.0 - 10.5 K/uL Final  . RBC 07/04/2020 5.82* 4.22 - 5.81 MIL/uL Final  . Hemoglobin 07/04/2020 16.6  13.0 - 17.0 g/dL Final  . HCT 07/04/2020 51.1  39.0 - 52.0 % Final  . MCV 07/04/2020 87.8  80.0 - 100.0 fL Final  . MCH 07/04/2020 28.5  26.0 - 34.0 pg Final  . MCHC 07/04/2020 32.5  30.0 - 36.0 g/dL Final  . RDW  07/04/2020 12.5  11.5 - 15.5 % Final  . Platelets 07/04/2020 297  150 - 400 K/uL Final  . nRBC 07/04/2020 0.0  0.0 - 0.2 % Final  . Neutrophils Relative % 07/04/2020 58  % Final  . Neutro Abs 07/04/2020 5.8  1.7 - 7.7 K/uL Final  . Lymphocytes Relative 07/04/2020 31  % Final  . Lymphs Abs 07/04/2020 3.1  0.7 - 4.0 K/uL Final  . Monocytes Relative 07/04/2020 9  % Final  . Monocytes Absolute 07/04/2020 0.8  0.1 - 1.0 K/uL Final  . Eosinophils Relative 07/04/2020 1  % Final  . Eosinophils Absolute 07/04/2020 0.1  0.0 - 0.5 K/uL Final  . Basophils Relative 07/04/2020 1  % Final  . Basophils Absolute 07/04/2020 0.1  0.0 - 0.1 K/uL Final  . Immature Granulocytes 07/04/2020 0  % Final  . Abs Immature Granulocytes 07/04/2020 0.02  0.00 - 0.07 K/uL Final   Performed at [facility], [place] [address] [place], [place] [address]  . Sodium 07/04/2020 139  135 - 145 mmol/L Final  . Potassium 07/04/2020 3.3* 3.5 - 5.1 mmol/L Final  . Chloride 07/04/2020 99  98 - 111 mmol/L Final  . CO2 07/04/2020 26  22 - 32 mmol/L Final  . Glucose, Bld 07/04/2020 99  70 - 99 mg/dL Final   Glucose reference range applies only to samples taken after fasting for at least 8 hours.  . BUN 07/04/2020 10  6 - 20 mg/dL Final  . Creatinine, Ser 07/04/2020 1.25* 0.61 - 1.24 mg/dL Final  . Calcium 07/04/2020 10.0  8.9 - 10.3 mg/dL Final  . Total Protein 07/04/2020 7.7  6.5 - 8.1 g/dL Final  . Albumin 07/04/2020 4.7  3.5 - 5.0 g/dL Final  . AST 07/04/2020 22  15 - 41 U/L Final  . ALT 07/04/2020 27  0 - 44 U/L Final  . Alkaline Phosphatase 07/04/2020 90  38 - 126 U/L Final  . Total Bilirubin 07/04/2020 0.8  0.3 - 1.2 mg/dL Final  . GFR, Estimated 07/04/2020 >60  >60 mL/min Final   Comment: (NOTE) Calculated using the CKD-EPI Creatinine Equation (2021)   . Anion gap 07/04/2020 14  5 - 15 Final   Performed at [facility], [place] [address], [place], [place] [address]  . Hgb A1c MFr Bld 07/04/2020 4.5* 4.8 - 5.6 %  Final   Comment: (NOTE) Pre diabetes:          5.7%-6.4%  Diabetes:              >6.4%  Glycemic control for   <7.0% adults with diabetes   . Mean Plasma Glucose 07/04/2020 82.45  mg/dL Final   Performed at [place] [address] [place], [place] [address]  . Magnesium 07/04/2020 2.2  1.7 - 2.4 mg/dL Final   Performed at [place],  [place] [address]  . Alcohol, Ethyl (B) 07/04/2020 <10  <10 mg/dL Final   Comment: (NOTE) Lowest detectable limit for serum alcohol is 10 mg/dL.  For medical purposes only. Performed at [facility], [place] [address] [place], [place] [address]   . Cholesterol 07/04/2020 193  0 - 200 mg/dL Final  . Triglycerides 07/04/2020 62  <150 mg/dL Final  . HDL 07/04/2020 76  >40 mg/dL Final  . Total CHOL/HDL Ratio 07/04/2020 2.5  RATIO Final  . VLDL 07/04/2020 12  0 - 40 mg/dL Final  . LDL Cholesterol 07/04/2020 105* 0 - 99 mg/dL Final   Comment:        Total Cholesterol/HDL:CHD Risk Coronary Heart Disease Risk Table                     Men   Women  1/2 Average Risk   3.4   3.3  Average Risk       5.0   4.4  2 X Average Risk   9.6   7.1  3 X Average Risk  23.4   11.0        Use the calculated Patient Ratio above and the CHD Risk Table to determine the patient's CHD Risk.        ATP III CLASSIFICATION (LDL):  <100     mg/dL   Optimal  100-129  mg/dL   Near or Above                    Optimal  130-159  mg/dL   Borderline  160-189  mg/dL   High  >190     mg/dL   Very High Performed at [place] [address], [place], [place] [address]   . TSH 07/04/2020 0.958  0.350 - 4.500 uIU/mL Final   Comment: Performed by a 3rd Generation assay with a functional sensitivity of <=0.01 uIU/mL. Performed at [facility], [place] [address] [place], [place] [address]   . Prolactin 07/04/2020 3.7* 4.0 - 15.2 ng/mL Final   Comment: (NOTE) Performed At: [facility] [place], [place] [id number] Rush Farmer MD [id number]   . POC Amphetamine UR 07/04/2020 None Detected  None Detected Final  . POC Secobarbital (BAR) 07/04/2020 None Detected  None Detected Final  . POC Buprenorphine (BUP) 07/04/2020 None Detected  None Detected Final  . POC Oxazepam (BZO) 07/04/2020 None Detected  None Detected Final  . POC Cocaine UR 07/04/2020 None Detected  None Detected Final  . POC Methamphetamine UR 07/04/2020 None Detected  None Detected Final  . POC Morphine 07/04/2020 None Detected  None Detected Final  . POC Oxycodone UR 07/04/2020 None Detected  None Detected Final  . POC Methadone UR 07/04/2020 None Detected  None Detected Final  . POC Marijuana UR 07/04/2020 None Detected  None Detected Final  . Valproic Acid Lvl 07/04/2020 <10* 50.0 - 100.0 ug/mL Final   Comment: RESULTS CONFIRMED BY MANUAL DILUTION Performed at [facility], [place] [address], [place], [place] [address]   . Glucose-Capillary 07/04/2020 112* 70 - 99 mg/dL Final   Glucose reference range applies only to samples taken after fasting for at least 8 hours.  Admission on 03/19/2020, Discharged on 03/19/2020  Component Date Value Ref Range Status  . Sodium 03/19/2020 139  135 - 145 mmol/L Final  . Potassium 03/19/2020 3.9  3.5 - 5.1 mmol/L Final  . Chloride 03/19/2020 102  98 -  111 mmol/L Final  . CO2 03/19/2020 28  22 - 32 mmol/L Final  . Glucose, Bld 03/19/2020 77  70 - 99 mg/dL Final   Glucose reference range applies only to samples taken after fasting for at least 8 hours.  . BUN 03/19/2020 11  6 - 20 mg/dL Final  . Creatinine, Ser 03/19/2020 1.51* 0.61 - 1.24 mg/dL Final  . Calcium 03/19/2020 9.5  8.9 - 10.3 mg/dL Final  . GFR calc non Af Amer 03/19/2020 >60  >60 mL/min Final  . GFR calc Af Amer 03/19/2020 >60  >60 mL/min Final  . Anion gap 03/19/2020 9  5 - 15 Final   Performed at [facility], [place] [address], [place], [place] [address]  . WBC 03/19/2020 7.0  4.0 - 10.5  K/uL Final  . RBC 03/19/2020 5.06  4.22 - 5.81 MIL/uL Final  . Hemoglobin 03/19/2020 14.6  13.0 - 17.0 g/dL Final  . HCT 03/19/2020 45.3  39.0 - 52.0 % Final  . MCV 03/19/2020 89.5  80.0 - 100.0 fL Final  . MCH 03/19/2020 28.9  26.0 - 34.0 pg Final  . MCHC 03/19/2020 32.2  30.0 - 36.0 g/dL Final  . RDW 03/19/2020 12.5  11.5 - 15.5 % Final  . Platelets 03/19/2020 250  150 - 400 K/uL Final  . nRBC 03/19/2020 0.0  0.0 - 0.2 % Final   Performed at [facility], [place] [address], [place], [place] [address]  . Troponin I (High Sensitivity) 03/19/2020 4  <18 ng/L Final   Comment: (NOTE) Elevated high sensitivity troponin I (hsTnI) values and significant  changes across serial measurements may suggest ACS but many other  chronic and acute conditions are known to elevate hsTnI results.  Refer to the "Links" section for chest pain algorithms and additional  guidance. Performed at [facility], [place] [address], [place], [place] [address]     Allergies: Risperidone and related  PTA Medications: (Not in a hospital admission)   Medical Decision Making  Patient admitted to Continuous Assessment Unit for stabilization and medication management.      Labs and EKG ordered  Lab Orders     Resp Panel by RT-PCR (Flu A&B, Covid) Nasopharyngeal Swab     CBC with Differential/Platelet     Comprehensive metabolic panel     Hemoglobin A1c     Ethanol     Urinalysis, Routine w reflex microscopic Urine, Clean Catch     Valproic acid level     POC SARS Coronavirus 2 Ag-ED - Nasal Swab (BD Veritor Kit)     POCT Urine Drug Screen - ([facility])  Medication Management:  Restarted home medications Meds ordered this encounter  Medications  . acetaminophen (TYLENOL) tablet 650 mg  . alum & mag hydroxide-simeth (MAALOX/MYLANTA) 200-200-20 MG/5ML suspension 30 mL  . magnesium hydroxide (MILK OF MAGNESIA) suspension 30 mL  . divalproex (DEPAKOTE) DR tablet 750 mg  . metFORMIN (GLUCOPHAGE) tablet 500  mg  . OLANZapine zydis (ZYPREXA) disintegrating tablet 15 mg  . traZODone (DESYREL) tablet 100 mg    Patient currently has outpatient psychiatric services to follow up with; will need to reschedule appointment.    Recommendations  Based on my evaluation the patient does not appear to have an emergency medical condition.  Adaly Puder, NP 08/06/20  2:19 PM

## 2020-08-06 NOTE — ED Notes (Signed)
Pt resting on pull out with eyes closed regular respirations in no acute distress. Will further assess with med administration.  
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
## 2020-08-06 NOTE — ED Notes (Signed)
Given dinner

## 2020-08-06 NOTE — ED Notes (Signed)
Pt being very inappropriate with staff. Calling staff inappropriate names, interacting with staff in an inappropriate manner. Using racial and sexual comments towards staff.  Not redirectable. Keeps pacing in flex area. Refusing to sit down on bed. Frequent loud outbursts. Incoherent thoughts. Trying to be confrontational with this staff. Will continue to monitor for safety

## 2020-08-07 ENCOUNTER — Other Ambulatory Visit: Payer: Self-pay | Admitting: Registered Nurse

## 2020-08-07 ENCOUNTER — Inpatient Hospital Stay (HOSPITAL_COMMUNITY)
Admission: AD | Admit: 2020-08-07 | Discharge: 2020-08-15 | DRG: 885 | Disposition: A | Discharge status: Home / Self Care | Payer: BC Managed Care – PPO | Source: Intra-hospital | Attending: Psychiatry | Admitting: Psychiatry

## 2020-08-07 DIAGNOSIS — Z20822 Contact with and (suspected) exposure to covid-19: Secondary | ICD-10-CM | POA: Diagnosis present

## 2020-08-07 DIAGNOSIS — F250 Schizoaffective disorder, bipolar type: Secondary | ICD-10-CM | POA: Diagnosis not present

## 2020-08-07 DIAGNOSIS — I10 Essential (primary) hypertension: Secondary | ICD-10-CM | POA: Diagnosis not present

## 2020-08-07 DIAGNOSIS — Z9119 Patient's noncompliance with other medical treatment and regimen: Secondary | ICD-10-CM

## 2020-08-07 DIAGNOSIS — Z7984 Long term (current) use of oral hypoglycemic drugs: Secondary | ICD-10-CM | POA: Diagnosis not present

## 2020-08-07 DIAGNOSIS — Z716 Tobacco abuse counseling: Secondary | ICD-10-CM

## 2020-08-07 DIAGNOSIS — Z79899 Other long term (current) drug therapy: Secondary | ICD-10-CM

## 2020-08-07 DIAGNOSIS — Z720 Tobacco use: Secondary | ICD-10-CM | POA: Diagnosis present

## 2020-08-07 DIAGNOSIS — Z9109 Other allergy status, other than to drugs and biological substances: Secondary | ICD-10-CM | POA: Diagnosis not present

## 2020-08-07 DIAGNOSIS — Z79891 Long term (current) use of opiate analgesic: Secondary | ICD-10-CM | POA: Diagnosis not present

## 2020-08-07 DIAGNOSIS — G4700 Insomnia, unspecified: Secondary | ICD-10-CM | POA: Diagnosis not present

## 2020-08-07 DIAGNOSIS — F17290 Nicotine dependence, other tobacco product, uncomplicated: Secondary | ICD-10-CM | POA: Diagnosis present

## 2020-08-07 MED ORDER — DIVALPROEX SODIUM 250 MG PO DR TAB: 750.0000 mg | DELAYED_RELEASE_TABLET | Freq: Every day | ORAL | Status: DC

## 2020-08-07 MED ORDER — DIVALPROEX SODIUM ER 500 MG PO TB24: 500.0000 mg | ORAL_TABLET | Freq: Every day | ORAL | Status: DC

## 2020-08-07 MED ORDER — ACETAMINOPHEN 325 MG PO TABS
650.0000 mg | ORAL_TABLET | Freq: Four times a day (QID) | ORAL | Status: DC | PRN
  Administered 2020-08-08: 650 mg via ORAL
  Filled 2020-08-07: qty 2

## 2020-08-07 MED ORDER — MAGNESIUM HYDROXIDE 400 MG/5ML PO SUSP: 30.0000 mL | Freq: Every day | ORAL | Status: DC | PRN

## 2020-08-07 MED ORDER — METFORMIN HCL 500 MG PO TABS
500.0000 mg | ORAL_TABLET | Freq: Every day | ORAL | Status: DC
  Administered 2020-08-08: 08:00:00 500 mg via ORAL
  Filled 2020-08-07 (×3): qty 1

## 2020-08-07 MED ORDER — OLANZAPINE 5 MG PO TBDP
15.0000 mg | ORAL_TABLET | Freq: Every day | ORAL | Status: DC
  Filled 2020-08-07: qty 1

## 2020-08-07 MED ORDER — ALUM & MAG HYDROXIDE-SIMETH 200-200-20 MG/5ML PO SUSP: 30.0000 mL | ORAL | Status: DC | PRN

## 2020-08-07 MED ORDER — TRAZODONE HCL 150 MG PO TABS
150.0000 mg | ORAL_TABLET | Freq: Every evening | ORAL | Status: DC | PRN
  Administered 2020-08-07 – 2020-08-14 (×8): 150 mg via ORAL
  Filled 2020-08-07 (×3): qty 1
  Filled 2020-08-07: qty 3
  Filled 2020-08-07 (×5): qty 1

## 2020-08-07 NOTE — ED Notes (Signed)
Patient manic continues to show signs of internal stimuli. Patient utilized toiletries for extended period of time.

## 2020-08-07 NOTE — ED Notes (Signed)
Given lunch

## 2020-08-07 NOTE — ED Notes (Signed)
Given snack. 

## 2020-08-07 NOTE — ED Notes (Signed)
Called report to [employer], [facility], rm [phone].  Pending Safe Transport at 10pm.

## 2020-08-07 NOTE — [facility] Notes (Signed)
Pt accepted to  [facility], [address] [place] , Bed 407-1  Reola Calkins NP is the accepting provider.    Dr. Jola Babinski is the attending provider.    Call report to  [phone]    Shuvon Rankin, [email or web address] notified.     Pt is  Voluntary.    Pt may be transported by [facility]  Pt scheduled  to arrive at [facility] after 8pm  Ladoris Gene MSW,LCSWA,LCASA Clinical Social Worker   Disposition [phone] (cell)

## 2020-08-07 NOTE — Progress Notes (Signed)
Received Nathan Holloway this AM in the shower, he came out with a blanket wrapped around his body. His clothes are being washed. He was compliant with his AM medication. He remains disorganized and hypermanic.

## 2020-08-07 NOTE — ED Notes (Signed)
Pt given apple juice and raisin bran

## 2020-08-07 NOTE — ED Notes (Signed)
Given sandwich for dinner

## 2020-08-07 NOTE — ED Notes (Signed)
Lunch given - pb&J sandwich, cheex-itz, Diet sprite

## 2020-08-07 NOTE — ED Provider Notes (Signed)
Behavioral Health Progress Note  Date and Time: 08/07/2020 5:03 PM Name: Nathan Holloway MRN:  [id number]  Subjective: Patient continues to report bizarre and illogical thoughts and comments.  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  He reports that he has been taking his medications and understands now that he needs to take them.  Patient then states that he is not sure where he is at and he is reminded again of the location he is at.  Patient states he is agreeable for another inpatient stay and to stabilize.  Diagnosis:  Final diagnoses:  Bipolar affective disorder, mixed, severe, with psychotic behavior (HCC)  Schizoaffective disorder, bipolar type (HCC)    Total Time spent with patient: 20 minutes  Past Psychiatric History: Schizoaffective disorder, previous hospitalization at [facility] Past Medical History:  Past Medical History:  Diagnosis Date  . Manic behavior (HCC)   . Psychotic affective disorder (HCC) 08/30/2015   History reviewed. No pertinent surgical history. Family History: History reviewed. No pertinent family history. Family Psychiatric  History: None reported Social History:  Social History   Substance and Sexual Activity  Alcohol Use No     Social History   Substance and Sexual Activity  Drug Use Not Currently  . Types: Marijuana   Comment: recent usage while out of town this week    Social History   Socioeconomic History  . Marital status: Single    Spouse name: Not on file  . Number of children: Not on file  . Years of education: Not on file  . Highest education level: Not on file  Occupational History  . Not on file  Tobacco Use  . Smoking status: Never Smoker  . Smokeless tobacco: Never Used  Vaping Use  . Vaping Use: Every day  . Substances: Nicotine, Flavoring  Substance and Sexual Activity  .  Alcohol use: No  . Drug use: Not Currently    Types: Marijuana    Comment: recent usage while out of town this week  . Sexual activity: Not Currently  Other Topics Concern  . Not on file  Social History Narrative  . Not on file   Social Determinants of Health   Financial Resource Strain: Not on file  Food Insecurity: Not on file  Transportation Needs: Not on file  Physical Activity: Not on file  Stress: Not on file  Social Connections: Not on file   SDOH:  SDOH Screenings   Alcohol Screen: Low Risk   . Last Alcohol Screening Score (AUDIT): 0  Depression (PHQ2-9): Medium Risk  . PHQ-2 Score: 15  Financial Resource Strain: Not on file  Food Insecurity: Not on file  Housing: Not on file  Physical Activity: Not on file  Social Connections: Not on file  Stress: Not on file  Tobacco Use: Low Risk   . Smoking Tobacco Use: Never Smoker  . Smokeless Tobacco Use: Never Used  Transportation Needs: Not on file   Additional Social History:    Pain Medications: see MAR Prescriptions: see MAR Over the Counter: see MAR History of alcohol / drug use?: Yes (History of using marijuana)                    Sleep: Fair  Appetite:  Fair  Current Medications:  Current Facility-Administered Medications  Medication Dose Route  Frequency Provider Last Rate Last Admin  . acetaminophen (TYLENOL) tablet 650 mg  650 mg Oral Q6H PRN Rankin, Shuvon B, NP      . alum & mag hydroxide-simeth (MAALOX/MYLANTA) 200-200-20 MG/5ML suspension 30 mL  30 mL Oral Q4H PRN Rankin, Shuvon B, NP      . diphenhydrAMINE (BENADRYL) capsule 25 mg  25 mg Oral Once Rankin, Shuvon B, NP      . divalproex (DEPAKOTE) DR tablet 750 mg  750 mg Oral Q supper Rankin, Shuvon B, NP   750 mg at 08/07/20 1611  . haloperidol (HALDOL) tablet 2 mg  2 mg Oral Once Rankin, Shuvon B, NP      . LORazepam (ATIVAN) tablet 1 mg  1 mg Oral Once Rankin, Shuvon B, NP      . magnesium hydroxide (MILK OF MAGNESIA) suspension 30 mL   30 mL Oral Daily PRN Rankin, Shuvon B, NP      . metFORMIN (GLUCOPHAGE) tablet 500 mg  500 mg Oral Q breakfast Rankin, Shuvon B, NP   500 mg at 08/07/20 0857  . OLANZapine zydis (ZYPREXA) disintegrating tablet 15 mg  15 mg Oral QHS Rankin, Shuvon B, NP   15 mg at 08/06/20 2129  . traZODone (DESYREL) tablet 100 mg  100 mg Oral QHS PRN Rankin, Shuvon B, NP       Current Outpatient Medications  Medication Sig Dispense Refill  . divalproex (DEPAKOTE) 250 MG DR tablet Take 3 tablets (750 mg total) by mouth daily with supper. (Patient not taking: No sig reported) 90 tablet 0  . metFORMIN (GLUCOPHAGE) 500 MG tablet Take 1 tablet (500 mg total) by mouth daily with breakfast. (Patient not taking: No sig reported) 30 tablet 0  . OLANZapine zydis (ZYPREXA) 15 MG disintegrating tablet Take 1 tablet (15 mg total) by mouth at bedtime. (Patient not taking: No sig reported) 30 tablet 2  . traZODone (DESYREL) 100 MG tablet Take 1 tablet (100 mg total) by mouth at bedtime as needed for sleep. (Patient not taking: No sig reported) 30 tablet 0    Labs  Lab Results:  Admission on 08/06/2020  Component Date Value Ref Range Status  . SARS Coronavirus 2 by RT PCR 08/06/2020 NEGATIVE  NEGATIVE Final   Comment: (NOTE) SARS-CoV-2 target nucleic acids are NOT DETECTED.  The SARS-CoV-2 RNA is generally detectable in upper respiratory specimens during the acute phase of infection. The lowest concentration of SARS-CoV-2 viral copies this assay can detect is 138 copies/mL. A negative result does not preclude SARS-Cov-2 infection and should not be used as the sole basis for treatment or other patient management decisions. A negative result may occur with  improper specimen collection/handling, submission of specimen other than nasopharyngeal swab, presence of viral mutation(s) within the areas targeted by this assay, and inadequate number of viral copies(<138 copies/mL). A negative result must be combined  with clinical observations, patient history, and epidemiological information. The expected result is Negative.  Fact Sheet for Patients:  BloggerCourse.com  Fact Sheet for Healthcare Providers:  SeriousBroker.it  This test is no                          t yet approved or cleared by the [place] FDA and  has been authorized for detection and/or diagnosis of SARS-CoV-2 by FDA under an Emergency Use Authorization (EUA). This EUA will remain  in effect (meaning this test can be used) for the duration of the  COVID-19 declaration under Section 564(b)(1) of the Act, 21 U.S.C.section 360bbb-3(b)(1), unless the authorization is terminated  or revoked sooner.      . Influenza A by PCR 08/06/2020 NEGATIVE  NEGATIVE Final  . Influenza B by PCR 08/06/2020 NEGATIVE  NEGATIVE Final   Comment: (NOTE) The Xpert Xpress SARS-CoV-2/FLU/RSV plus assay is intended as an aid in the diagnosis of influenza from Nasopharyngeal swab specimens and should not be used as a sole basis for treatment. Nasal washings and aspirates are unacceptable for Xpert Xpress SARS-CoV-2/FLU/RSV testing.  Fact Sheet for Patients: BloggerCourse.com  Fact Sheet for Healthcare Providers: SeriousBroker.it  This test is not yet approved or cleared by the [place] FDA and has been authorized for detection and/or diagnosis of SARS-CoV-2 by FDA under an Emergency Use Authorization (EUA). This EUA will remain in effect (meaning this test can be used) for the duration of the COVID-19 declaration under Section 564(b)(1) of the Act, 21 U.S.C. section 360bbb-3(b)(1), unless the authorization is terminated or revoked.  Performed at [facility], [address] [place], [place] [address]   . WBC 08/06/2020 11.1* 4.0 - 10.5 K/uL Final  . RBC 08/06/2020 5.55  4.22 - 5.81 MIL/uL Final  . Hemoglobin 08/06/2020 15.8   13.0 - 17.0 g/dL Final  . HCT 11/91/4782 49.0  39.0 - 52.0 % Final  . MCV 08/06/2020 88.3  80.0 - 100.0 fL Final  . MCH 08/06/2020 28.5  26.0 - 34.0 pg Final  . MCHC 08/06/2020 32.2  30.0 - 36.0 g/dL Final  . RDW 95/62/1308 12.7  11.5 - 15.5 % Final  . Platelets 08/06/2020 278  150 - 400 K/uL Final  . nRBC 08/06/2020 0.0  0.0 - 0.2 % Final  . Neutrophils Relative % 08/06/2020 65  % Final  . Neutro Abs 08/06/2020 7.3  1.7 - 7.7 K/uL Final  . Lymphocytes Relative 08/06/2020 23  % Final  . Lymphs Abs 08/06/2020 2.5  0.7 - 4.0 K/uL Final  . Monocytes Relative 08/06/2020 9  % Final  . Monocytes Absolute 08/06/2020 1.0  0.1 - 1.0 K/uL Final  . Eosinophils Relative 08/06/2020 1  % Final  . Eosinophils Absolute 08/06/2020 0.2  0.0 - 0.5 K/uL Final  . Basophils Relative 08/06/2020 1  % Final  . Basophils Absolute 08/06/2020 0.1  0.0 - 0.1 K/uL Final  . Immature Granulocytes 08/06/2020 1  % Final  . Abs Immature Granulocytes 08/06/2020 0.05  0.00 - 0.07 K/uL Final   Performed at [facility], [address] [place], [place] [address]  . Sodium 08/06/2020 139  135 - 145 mmol/L Final  . Potassium 08/06/2020 3.9  3.5 - 5.1 mmol/L Final  . Chloride 08/06/2020 102  98 - 111 mmol/L Final  . CO2 08/06/2020 21* 22 - 32 mmol/L Final  . Glucose, Bld 08/06/2020 88  70 - 99 mg/dL Final   Glucose reference range applies only to samples taken after fasting for at least 8 hours.  . BUN 08/06/2020 16  6 - 20 mg/dL Final  . Creatinine, Ser 08/06/2020 1.43* 0.61 - 1.24 mg/dL Final  . Calcium 69/62/9528 10.3  8.9 - 10.3 mg/dL Final  . Total Protein 08/06/2020 8.0  6.5 - 8.1 g/dL Final  . Albumin 41/32/4401 4.7  3.5 - 5.0 g/dL Final  . AST 02/72/5366 18  15 - 41 U/L Final  . ALT 08/06/2020 14  0 - 44 U/L Final  . Alkaline Phosphatase 08/06/2020 78  38 -  126 U/L Final  . Total Bilirubin 08/06/2020 1.0  0.3 - 1.2 mg/dL Final  . GFR, Estimated 08/06/2020 >60  >60 mL/min Final   Comment: (NOTE) Calculated  using the CKD-EPI Creatinine Equation (2021)   . Anion gap 08/06/2020 16* 5 - 15 Final   Performed at [facility], [address], [place], [place] [address]  . Hgb A1c MFr Bld 08/06/2020 4.6* 4.8 - 5.6 % Final   Comment: (NOTE) Pre diabetes:          5.7%-6.4%  Diabetes:              >6.4%  Glycemic control for   <7.0% adults with diabetes   . Mean Plasma Glucose 08/06/2020 85.32  mg/dL Final   Performed at [facility], [address] [place], [place] [address]  . Alcohol, Ethyl (B) 08/06/2020 <10  <10 mg/dL Final   Comment: (NOTE) Lowest detectable limit for serum alcohol is 10 mg/dL.  For medical purposes only. Performed at [facility], [address] [place], [place] [address]   . Valproic Acid Lvl 08/06/2020 27* 50.0 - 100.0 ug/mL Final   Performed at [facility], [address], [place], [place] [address]  . Glucose, UA 08/06/2020 NEGATIVE  NEGATIVE mg/dL Final  . Bilirubin Urine 08/06/2020 NEGATIVE  NEGATIVE Final  . Ketones, ur 08/06/2020 NEGATIVE  NEGATIVE mg/dL Final  . Specific Gravity, Urine 08/06/2020 >=1.030  1.005 - 1.030 Final  . Hgb urine dipstick 08/06/2020 NEGATIVE  NEGATIVE Final  . pH 08/06/2020 6.0  5.0 - 8.0 Final  . Protein, ur 08/06/2020 NEGATIVE  NEGATIVE mg/dL Final  . Urobilinogen, UA 08/06/2020 0.2  0.0 - 1.0 mg/dL Final  . Nitrite 49/67/5916 NEGATIVE  NEGATIVE Final  . Glori Luis 08/06/2020 NEGATIVE  NEGATIVE Final   Biochemical Testing Only. Please order routine urinalysis from main lab if confirmatory testing is needed.  Marland Kitchen SARS Coronavirus 2 Ag 08/06/2020 NEGATIVE  NEGATIVE Final   Comment: (NOTE) SARS-CoV-2 antigen NOT DETECTED.   Negative results are presumptive.  Negative results do not preclude SARS-CoV-2 infection and should not be used as the sole basis for treatment or other patient management decisions, including infection  control decisions, particularly in the presence of clinical signs and   symptoms consistent with COVID-19, or in those who have been in contact with the virus.  Negative results must be combined with clinical observations, patient history, and epidemiological information. The expected result is Negative.  Fact Sheet for Patients: https://sanders-williams.net/  Fact Sheet for Healthcare Providers: https://martinez.com/   This test is not yet approved or cleared by the [place] FDA and  has been authorized for detection and/or diagnosis of SARS-CoV-2 by FDA under an Emergency Use Authorization (EUA).  This EUA will remain in effect (meaning this test can be used) for the duration of  the C                          OVID-19 declaration under Section 564(b)(1) of the Act, 21 U.S.C. section 360bbb-3(b)(1), unless the authorization is terminated or revoked sooner.    Admission on 07/05/2020, Discharged on 07/11/2020  Component Date Value Ref Range Status  . Sodium 07/06/2020 138  135 - 145 mmol/L Final  . Potassium 07/06/2020 4.4  3.5 - 5.1 mmol/L Final  . Chloride 07/06/2020 102  98 - 111 mmol/L Final  . CO2 07/06/2020 27  22 - 32 mmol/L Final  .  Glucose, Bld 07/06/2020 77  70 - 99 mg/dL Final   Glucose reference range applies only to samples taken after fasting for at least 8 hours.  . BUN 07/06/2020 16  6 - 20 mg/dL Final  . Creatinine, Ser 07/06/2020 1.27* 0.61 - 1.24 mg/dL Final  . Calcium 69/62/9528 9.4  8.9 - 10.3 mg/dL Final  . Total Protein 07/06/2020 7.2  6.5 - 8.1 g/dL Final  . Albumin 41/32/4401 4.3  3.5 - 5.0 g/dL Final  . AST 02/72/5366 21  15 - 41 U/L Final  . ALT 07/06/2020 25  0 - 44 U/L Final  . Alkaline Phosphatase 07/06/2020 76  38 - 126 U/L Final  . Total Bilirubin 07/06/2020 0.8  0.3 - 1.2 mg/dL Final  . GFR, Estimated 07/06/2020 >60  >60 mL/min Final   Comment: (NOTE) Calculated using the CKD-EPI Creatinine Equation (2021)   . Anion gap 07/06/2020 9  5 - 15 Final   Performed at [facility], [address], [place], [place] [address]  . WBC 07/10/2020 6.2  4.0 - 10.5 K/uL Final  . RBC 07/10/2020 4.95  4.22 - 5.81 MIL/uL Final  . Hemoglobin 07/10/2020 14.4  13.0 - 17.0 g/dL Final  . HCT 74/25/9563 44.6  39.0 - 52.0 % Final  . MCV 07/10/2020 90.1  80.0 - 100.0 fL Final  . MCH 07/10/2020 29.1  26.0 - 34.0 pg Final  . MCHC 07/10/2020 32.3  30.0 - 36.0 g/dL Final  . RDW 87/56/4332 12.6  11.5 - 15.5 % Final  . Platelets 07/10/2020 226  150 - 400 K/uL Final  . nRBC 07/10/2020 0.0  0.0 - 0.2 % Final  . Neutrophils Relative % 07/10/2020 51  % Final  . Neutro Abs 07/10/2020 3.2  1.7 - 7.7 K/uL Final  . Lymphocytes Relative 07/10/2020 38  % Final  . Lymphs Abs 07/10/2020 2.4  0.7 - 4.0 K/uL Final  . Monocytes Relative 07/10/2020 9  % Final  . Monocytes Absolute 07/10/2020 0.6  0.1 - 1.0 K/uL Final  . Eosinophils Relative 07/10/2020 1  % Final  . Eosinophils Absolute 07/10/2020 0.1  0.0 - 0.5 K/uL Final  . Basophils Relative 07/10/2020 1  % Final  . Basophils Absolute 07/10/2020 0.0  0.0 - 0.1 K/uL Final  . Immature Granulocytes 07/10/2020 0  % Final  . Abs Immature Granulocytes 07/10/2020 0.01  0.00 - 0.07 K/uL Final   Performed at [facility], [address] [place], [place] [address]  . Valproic Acid Lvl 07/10/2020 61  50.0 - 100.0 ug/mL Final   Performed at [facility], [address] [place], [place] [address]  . Sodium 07/10/2020 141  135 - 145 mmol/L Final  . Potassium 07/10/2020 4.2  3.5 - 5.1 mmol/L Final  . Chloride 07/10/2020 104  98 - 111 mmol/L Final  . CO2 07/10/2020 28  22 - 32 mmol/L Final  . Glucose, Bld 07/10/2020 87  70 - 99 mg/dL Final   Glucose reference range applies only to samples taken after fasting for at least 8 hours.  . BUN 07/10/2020 16  6 - 20 mg/dL Final  . Creatinine, Ser 07/10/2020 1.21  0.61 - 1.24 mg/dL Final  . Calcium 63/08/6008 9.1  8.9 - 10.3 mg/dL Final  . Total Protein  07/10/2020 6.4* 6.5 - 8.1 g/dL Final  . Albumin 93/23/5573 4.0  3.5 - 5.0 g/dL Final  . AST 22/09/5425 16  15 - 41 U/L Final  .  ALT 07/10/2020 18  0 - 44 U/L Final  . Alkaline Phosphatase 07/10/2020 76  38 - 126 U/L Final  . Total Bilirubin 07/10/2020 0.8  0.3 - 1.2 mg/dL Final  . GFR, Estimated 07/10/2020 >60  >60 mL/min Final   Comment: (NOTE) Calculated using the CKD-EPI Creatinine Equation (2021)   . Anion gap 07/10/2020 9  5 - 15 Final   Performed at [facility], [address] [place], [place] [address]  Admission on 07/04/2020, Discharged on 07/05/2020  Component Date Value Ref Range Status  . SARS Coronavirus 2 by RT PCR 07/04/2020 NEGATIVE  NEGATIVE Final   Comment: (NOTE) SARS-CoV-2 target nucleic acids are NOT DETECTED.  The SARS-CoV-2 RNA is generally detectable in upper respiratoy specimens during the acute phase of infection. The lowest concentration of SARS-CoV-2 viral copies this assay can detect is 131 copies/mL. A negative result does not preclude SARS-Cov-2 infection and should not be used as the sole basis for treatment or other patient management decisions. A negative result may occur with  improper specimen collection/handling, submission of specimen other than nasopharyngeal swab, presence of viral mutation(s) within the areas targeted by this assay, and inadequate number of viral copies (<131 copies/mL). A negative result must be combined with clinical observations, patient history, and epidemiological information. The expected result is Negative.  Fact Sheet for Patients:  https://www.moore.com/  Fact Sheet for Healthcare Providers:  https://www.young.biz/  This test is no                          t yet approved or cleared by the [place] FDA and  has been authorized for detection and/or diagnosis of SARS-CoV-2 by FDA under an Emergency Use Authorization (EUA). This EUA will remain  in  effect (meaning this test can be used) for the duration of the COVID-19 declaration under Section 564(b)(1) of the Act, 21 U.S.C. section 360bbb-3(b)(1), unless the authorization is terminated or revoked sooner.    . Influenza A by PCR 07/04/2020 NEGATIVE  NEGATIVE Final  . Influenza B by PCR 07/04/2020 NEGATIVE  NEGATIVE Final   Comment: (NOTE) The Xpert Xpress SARS-CoV-2/FLU/RSV assay is intended as an aid in  the diagnosis of influenza from Nasopharyngeal swab specimens and  should not be used as a sole basis for treatment. Nasal washings and  aspirates are unacceptable for Xpert Xpress SARS-CoV-2/FLU/RSV  testing.  Fact Sheet for Patients: https://www.moore.com/  Fact Sheet for Healthcare Providers: https://www.young.biz/  This test is not yet approved or cleared by the [place] FDA and  has been authorized for detection and/or diagnosis of SARS-CoV-2 by  FDA under an Emergency Use Authorization (EUA). This EUA will remain  in effect (meaning this test can be used) for the duration of the  Covid-19 declaration under Section 564(b)(1) of the Act, 21  U.S.C. section 360bbb-3(b)(1), unless the authorization is  terminated or revoked. Performed at [facility], [address] [place], [place] [address]   . SARS Coronavirus 2 Ag 07/04/2020 Negative  Negative Preliminary  . WBC 07/04/2020 9.8  4.0 - 10.5 K/uL Final  . RBC 07/04/2020 5.82* 4.22 - 5.81 MIL/uL Final  . Hemoglobin 07/04/2020 16.6  13.0 - 17.0 g/dL Final  . HCT 34/74/2595 51.1  39.0 - 52.0 % Final  . MCV 07/04/2020 87.8  80.0 - 100.0 fL Final  . MCH 07/04/2020 28.5  26.0 - 34.0 pg Final  . MCHC 07/04/2020 32.5  30.0 -  36.0 g/dL Final  . RDW 16/05/9603 12.5  11.5 - 15.5 % Final  . Platelets 07/04/2020 297  150 - 400 K/uL Final  . nRBC 07/04/2020 0.0  0.0 - 0.2 % Final  . Neutrophils Relative % 07/04/2020 58  % Final  . Neutro Abs 07/04/2020 5.8  1.7 - 7.7 K/uL Final   . Lymphocytes Relative 07/04/2020 31  % Final  . Lymphs Abs 07/04/2020 3.1  0.7 - 4.0 K/uL Final  . Monocytes Relative 07/04/2020 9  % Final  . Monocytes Absolute 07/04/2020 0.8  0.1 - 1.0 K/uL Final  . Eosinophils Relative 07/04/2020 1  % Final  . Eosinophils Absolute 07/04/2020 0.1  0.0 - 0.5 K/uL Final  . Basophils Relative 07/04/2020 1  % Final  . Basophils Absolute 07/04/2020 0.1  0.0 - 0.1 K/uL Final  . Immature Granulocytes 07/04/2020 0  % Final  . Abs Immature Granulocytes 07/04/2020 0.02  0.00 - 0.07 K/uL Final   Performed at [facility], [address], [place], [place] [address]  . Sodium 07/04/2020 139  135 - 145 mmol/L Final  . Potassium 07/04/2020 3.3* 3.5 - 5.1 mmol/L Final  . Chloride 07/04/2020 99  98 - 111 mmol/L Final  . CO2 07/04/2020 26  22 - 32 mmol/L Final  . Glucose, Bld 07/04/2020 99  70 - 99 mg/dL Final   Glucose reference range applies only to samples taken after fasting for at least 8 hours.  . BUN 07/04/2020 10  6 - 20 mg/dL Final  . Creatinine, Ser 07/04/2020 1.25* 0.61 - 1.24 mg/dL Final  . Calcium 11/91/4782 10.0  8.9 - 10.3 mg/dL Final  . Total Protein 07/04/2020 7.7  6.5 - 8.1 g/dL Final  . Albumin 95/62/1308 4.7  3.5 - 5.0 g/dL Final  . AST 65/78/4696 22  15 - 41 U/L Final  . ALT 07/04/2020 27  0 - 44 U/L Final  . Alkaline Phosphatase 07/04/2020 90  38 - 126 U/L Final  . Total Bilirubin 07/04/2020 0.8  0.3 - 1.2 mg/dL Final  . GFR, Estimated 07/04/2020 >60  >60 mL/min Final   Comment: (NOTE) Calculated using the CKD-EPI Creatinine Equation (2021)   . Anion gap 07/04/2020 14  5 - 15 Final   Performed at [facility], [address], [place], [place] [address]  . Hgb A1c MFr Bld 07/04/2020 4.5* 4.8 - 5.6 % Final   Comment: (NOTE) Pre diabetes:          5.7%-6.4%  Diabetes:              >6.4%  Glycemic control for   <7.0% adults with diabetes   . Mean Plasma Glucose 07/04/2020 82.45  mg/dL Final   Performed at [facility], [address], [place], [place] [address]  . Magnesium 07/04/2020 2.2  1.7 - 2.4 mg/dL Final   Performed at [facility], [address] [place], [place] [address]  . Alcohol, Ethyl (B) 07/04/2020 <10  <10 mg/dL Final   Comment: (NOTE) Lowest detectable limit for serum alcohol is 10 mg/dL.  For medical purposes only. Performed at [facility], [address], [place], [place] [address]   . Cholesterol 07/04/2020 193  0 - 200 mg/dL Final  . Triglycerides 07/04/2020 62  <150 mg/dL Final  . HDL 64/40/3474 76  >40 mg/dL Final  . Total CHOL/HDL Ratio 07/04/2020 2.5  RATIO Final  . VLDL 07/04/2020 12  0 -  40 mg/dL Final  . LDL Cholesterol 07/04/2020 105* 0 - 99 mg/dL Final   Comment:        Total Cholesterol/HDL:CHD Risk Coronary Heart Disease Risk Table                     Men   Women  1/2 Average Risk   3.4   3.3  Average Risk       5.0   4.4  2 X Average Risk   9.6   7.1  3 X Average Risk  23.4   11.0        Use the calculated Patient Ratio above and the CHD Risk Table to determine the patient's CHD Risk.        ATP III CLASSIFICATION (LDL):  <100     mg/dL   Optimal  [id number]  mg/dL   Near or Above                    Optimal  130-159  mg/dL   Borderline  [id number]  mg/dL   High  >811>190     mg/dL   Very High Performed at [facility], [address] [place], [place] [address]   . TSH 07/04/2020 0.958  0.350 - 4.500 uIU/mL Final   Comment: Performed by a 3rd Generation assay with a functional sensitivity of <=0.01 uIU/mL. Performed at [facility], [address] [place], [place] [address]   . Prolactin 07/04/2020 3.7* 4.0 - 15.2 ng/mL Final   Comment: (NOTE) Performed At: [facility] [address] [place], [place] [id number] Jolene SchimkeNagendra Sanjai MD ON:[phone]   . POC Amphetamine UR 07/04/2020 None Detected  None Detected Final  . POC Secobarbital (BAR) 07/04/2020 None Detected  None Detected Final  . POC Buprenorphine (BUP)  07/04/2020 None Detected  None Detected Final  . POC Oxazepam (BZO) 07/04/2020 None Detected  None Detected Final  . POC Cocaine UR 07/04/2020 None Detected  None Detected Final  . POC Methamphetamine UR 07/04/2020 None Detected  None Detected Final  . POC Morphine 07/04/2020 None Detected  None Detected Final  . POC Oxycodone UR 07/04/2020 None Detected  None Detected Final  . POC Methadone UR 07/04/2020 None Detected  None Detected Final  . POC Marijuana UR 07/04/2020 None Detected  None Detected Final  . Valproic Acid Lvl 07/04/2020 <10* 50.0 - 100.0 ug/mL Final   Comment: RESULTS CONFIRMED BY MANUAL DILUTION Performed at [facility], [address] [address] [place], [place] [id number]   . Glucose-Capillary 07/04/2020 112* 70 - 99 mg/dL Final   Glucose reference range applies only to samples taken after fasting for at least 8 hours.  Admission on 03/19/2020, Discharged on 03/19/2020  Component Date Value Ref Range Status  . Sodium 03/19/2020 139  135 - 145 mmol/L Final  . Potassium 03/19/2020 3.9  3.5 - 5.1 mmol/L Final  . Chloride 03/19/2020 102  98 - 111 mmol/L Final  . CO2 03/19/2020 28  22 - 32 mmol/L Final  . Glucose, Bld 03/19/2020 77  70 - 99 mg/dL Final   Glucose reference range applies only to samples taken after fasting for at least 8 hours.  . BUN 03/19/2020 11  6 - 20 mg/dL Final  . Creatinine, Ser 03/19/2020 1.51* 0.61 - 1.24 mg/dL Final  . Calcium 27/25/366407/29/2021 9.5  8.9 - 10.3 mg/dL Final  . GFR calc non Af Amer 03/19/2020 >60  >60 mL/min Final  .  GFR calc Af Amer 03/19/2020 >60  >60 mL/min Final  . Anion gap 03/19/2020 9  5 - 15 Final   Performed at [facility], [address], [place], [place] [address]  . WBC 03/19/2020 7.0  4.0 - 10.5 K/uL Final  . RBC 03/19/2020 5.06  4.22 - 5.81 MIL/uL Final  . Hemoglobin 03/19/2020 14.6  13.0 - 17.0 g/dL Final  . HCT 62/37/6283 45.3  39.0 - 52.0 % Final  . MCV 03/19/2020 89.5  80.0 - 100.0 fL Final  . MCH 03/19/2020 28.9   26.0 - 34.0 pg Final  . MCHC 03/19/2020 32.2  30.0 - 36.0 g/dL Final  . RDW 15/17/6160 12.5  11.5 - 15.5 % Final  . Platelets 03/19/2020 250  150 - 400 K/uL Final  . nRBC 03/19/2020 0.0  0.0 - 0.2 % Final   Performed at [facility], [address] [place], [place] [address]  . Troponin I (High Sensitivity) 03/19/2020 4  <18 ng/L Final   Comment: (NOTE) Elevated high sensitivity troponin I (hsTnI) values and significant  changes across serial measurements may suggest ACS but many other  chronic and acute conditions are known to elevate hsTnI results.  Refer to the "Links" section for chest pain algorithms and additional  guidance. Performed at [facility], [address] [place], [place] [address]     Blood Alcohol level:  Lab Results  Component Value Date   [facility] <10 08/06/2020   ETH <10 07/04/2020    Metabolic Disorder Labs: Lab Results  Component Value Date   HGBA1C 4.6 (L) 08/06/2020   MPG 85.32 08/06/2020   MPG 82.45 07/04/2020   Lab Results  Component Value Date   PROLACTIN 3.7 (L) 07/04/2020   Lab Results  Component Value Date   CHOL 193 07/04/2020   TRIG 62 07/04/2020   HDL 76 07/04/2020   CHOLHDL 2.5 07/04/2020   VLDL 12 07/04/2020   LDLCALC 105 (H) 07/04/2020    Therapeutic Lab Levels: No results found for: LITHIUM Lab Results  Component Value Date   VALPROATE 27 (L) 08/06/2020   VALPROATE 61 07/10/2020   No components found for:  CBMZ  Physical Findings   AIMS   Flowsheet Row [facility]  AIMS Total Score 0    AUDIT   Flowsheet Row [facility]  Alcohol Use Disorder Identification Test Final Score (AUDIT) 0    PHQ2-9   Flowsheet Row ED from 08/06/2020 in [facility]  PHQ-2 Total Score 2  PHQ-9 Total Score 15       Musculoskeletal  Strength &  Muscle Tone: within normal limits Gait & Station: normal Patient leans: N/A  Psychiatric Specialty Exam  Presentation  General Appearance: Disheveled; Bizarre  Eye Contact:Good  Speech:Clear and Coherent; Normal Rate  Speech Volume:Normal  Handedness:Right   Mood and Affect  Mood:Anxious; Euthymic  Affect:Congruent   Thought Process  Thought Processes:Disorganized; Irrevelant  Descriptions of Associations:Tangential  Orientation:Partial  Thought Content:Illogical; Delusions; Paranoid Ideation; Rumination; Tangential  Hallucinations:Hallucinations: Other (comment) (denies but does appear to be responding at times)  Ideas of Reference:Delusions  Suicidal Thoughts:Suicidal Thoughts: No  Homicidal Thoughts:Homicidal Thoughts: No   Sensorium  Memory:Immediate Fair; Recent Fair; Remote Fair  Judgment:Impaired  Insight:Lacking   Executive Functions  Concentration:Fair  Attention Span:Fair  Recall:Fair  Fund of Knowledge:Fair  Language:Fair   Psychomotor  Activity  Psychomotor Activity:Psychomotor Activity: Increased   Assets  Assets:Desire for Improvement; Financial Resources/Insurance; Housing; Physical Health; Social Support   Sleep  Sleep:Sleep: Poor   Physical Exam  Physical Exam Vitals and nursing note reviewed.  Constitutional:      Appearance: He is well-developed.  HENT:     Head: Normocephalic.  Eyes:     Pupils: Pupils are equal, round, and reactive to light.  Cardiovascular:     Rate and Rhythm: Normal rate.  Pulmonary:     Effort: Pulmonary effort is normal.  Musculoskeletal:        General: Normal range of motion.  Neurological:     Mental Status: He is alert and oriented to person, place, and time.  Psychiatric:        Speech: Speech is rapid and pressured and tangential.        Behavior: Behavior is hyperactive. Behavior is cooperative.    Review of Systems  Constitutional: Negative.   HENT: Negative.   Eyes:  Negative.   Respiratory: Negative.   Cardiovascular: Negative.   Gastrointestinal: Negative.   Genitourinary: Negative.   Musculoskeletal: Negative.   Skin: Negative.   Neurological: Negative.   Endo/Heme/Allergies: Negative.    Blood pressure 128/85, pulse 76, temperature 98.7 F (37.1 C), temperature source Oral, resp. rate 18, SpO2 98 %. There is no height or weight on file to calculate BMI.  Treatment Plan Summary: Daily contact with patient to assess and evaluate symptoms and progress in treatment and Medication management  Patient continues to meet inpatient treatment criteria.  Patient continues to be delusional, pressured speech, illogical thought process with early manic symptoms such as poor sleep.  Patient is agreeable for inpatient psychiatric treatment.  There was suspected to be a bed at for patient at [facility] today however that has gotten pushed back and will continue to seek inpatient treatment placement.  Patient will continue current medication regimen  Maryfrances Bunnell, FNP 08/07/2020 5:03 PM

## 2020-08-07 NOTE — Progress Notes (Signed)
Jabron remains on the unit with intervals of hyper active at interval. He took a brief nap sitting up in a chair near the nurses station. Later he was escorted to the court yard with 2 MHT.

## 2020-08-07 NOTE — ED Notes (Signed)
Pt resting on pull out with eyes closed unlabored respirations through night in no acute distress. Safety maintained.  

## 2020-08-08 ENCOUNTER — Other Ambulatory Visit: Payer: Self-pay

## 2020-08-08 ENCOUNTER — Encounter (HOSPITAL_COMMUNITY): Payer: Self-pay | Admitting: Registered Nurse

## 2020-08-08 DIAGNOSIS — F250 Schizoaffective disorder, bipolar type: Principal | ICD-10-CM

## 2020-08-08 MED ORDER — ZIPRASIDONE MESYLATE 20 MG IM SOLR: 20.0000 mg | Freq: Four times a day (QID) | INTRAMUSCULAR | Status: DC | PRN

## 2020-08-08 MED ORDER — OLANZAPINE 10 MG PO TBDP
20.0000 mg | ORAL_TABLET | Freq: Every day | ORAL | Status: DC
  Administered 2020-08-08 – 2020-08-14 (×7): 20 mg via ORAL
  Filled 2020-08-08 (×10): qty 2

## 2020-08-08 MED ORDER — NICOTINE POLACRILEX 2 MG MT GUM: 2.0000 mg | CHEWING_GUM | OROMUCOSAL | Status: DC | PRN

## 2020-08-08 MED ORDER — LORAZEPAM 1 MG PO TABS
1.0000 mg | ORAL_TABLET | Freq: Four times a day (QID) | ORAL | Status: DC | PRN
  Administered 2020-08-08 – 2020-08-12 (×4): 1 mg via ORAL
  Filled 2020-08-08 (×4): qty 1

## 2020-08-08 MED ORDER — DIVALPROEX SODIUM ER 250 MG PO TB24
750.0000 mg | ORAL_TABLET | Freq: Every day | ORAL | Status: DC
  Administered 2020-08-08 – 2020-08-11 (×4): 750 mg via ORAL
  Filled 2020-08-08 (×7): qty 3

## 2020-08-08 MED ORDER — NICOTINE POLACRILEX 2 MG MT GUM
CHEWING_GUM | OROMUCOSAL | Status: AC
  Filled 2020-08-08: qty 1

## 2020-08-08 MED ORDER — OLANZAPINE 10 MG PO TBDP
10.0000 mg | ORAL_TABLET | Freq: Three times a day (TID) | ORAL | Status: DC | PRN
  Administered 2020-08-09 – 2020-08-10 (×2): 10 mg via ORAL
  Filled 2020-08-08 (×2): qty 1

## 2020-08-08 MED ORDER — LORAZEPAM 1 MG PO TABS
2.0000 mg | ORAL_TABLET | ORAL | Status: AC
  Administered 2020-08-08: 13:00:00 2 mg via ORAL
  Filled 2020-08-08: qty 2

## 2020-08-08 NOTE — [facility] Group Notes (Signed)
.  Psychoeducational Group Note    Date:08/08/2020 Time: 1300-1400    Life Skills:  A group where two lists are made. What people need and what are things that we do that are healthy. The lists are developed by the patients and it is explained that we often do the actions that are not healthy to get our list of needs met.   Purpose of Group: . The group focus' on teaching patients on how to identify their needs and how to develop the coping skills needed to get their needs met   Additional Comments:  Having trouble concentrating in the group and got up and left  Bryson Dames A

## 2020-08-08 NOTE — Progress Notes (Signed)
Patient is very anxious  08/08/20 2015  Psych Admission Type (Psych Patients Only)  Admission Status Voluntary  Psychosocial Assessment  Patient Complaints Anxiety;Hyperactivity;Restlessness  Eye Contact Fair  Facial Expression Anxious  Affect Preoccupied;Anxious  Speech Pressured;Tangential  Interaction Attention-seeking;Demanding;Flirtatious;Intrusive  Motor Activity Hyperactive  Appearance/Hygiene Unremarkable  Behavior Characteristics Appropriate to situation;Anxious;Pacing  Mood Preoccupied  Thought Process  Coherency Loose associations;Tangential;Disorganized  Content Preoccupation  Delusions Grandeur;Paranoid  Perception WDL  Hallucination None reported or observed  Judgment Poor  Confusion Mild  Danger to Self  Current suicidal ideation? Denies  Danger to Others  Danger to Others None reported or observed   and manic with pressured speech. He was complaint with his scheduled medication.

## 2020-08-08 NOTE — [facility] Group Notes (Signed)
  [facility] LCSW Group Therapy Note  Date/Time:  08/08/2020 11:15AM-12:00PM  Type of Therapy and Topic:  Group Therapy:  Feelings About Hospitalization  Participation Level:  Active   Description of Group This process group involved patients discussing their feelings related to being hospitalized, as well as the benefits they see to being in the hospital.  These feelings and benefits were itemized.  The group then brainstormed specific ways in which they could seek those same benefits when they discharge and return home.  Therapeutic Goals 1. Patient will identify and describe positive and negative feelings related to hospitalization 2. Patient will verbalize benefits of hospitalization to themselves personally 3. Patients will brainstorm together ways they can obtain similar benefits in the outpatient setting, identify barriers to wellness and possible solutions  Summary of Patient Progress:  The patient expressed his primary feelings about being hospitalized are positive.  He stated this is his 6th or 7th hospitalization and he has learned something new each time.  He went on to talk about how marijuana brought about a spiritual awakening, but he does not want to use it anymore because it might be laced with something.  He stated Risperdal has almost killed him two times.  When asked what he can do to stay out of the hospital and stay well, he stated he will vape nicotine in order to stay out, adding that it is good for you.  CSW pointed out that nicotine is associated with lung cancer and an increased mortality rate.  He said this offended him.  When CSW explained the statistics, he then said he was kidding, just trying to see how CSW would react.  He frequently went off on tangents throughout group.  The group was informed that tomorrow we will be listening to music and he became argumentative, stating only if his music could be heard as well.  CSW explained the way in which group is held and why,  and he then asked what a patient can do if a song triggers them.  CSW told him they would raise their hand and the song would be changed.  He then started talking about some triggers are not something you can raise a hand for, but something you would have to "shoot up the window."  CSW told him that was inappropriate to say and he laughed.  He kept insisting that his music should be played, and CSW suggested that perhaps music therapy should not be attempted, but other patients wanted it.  CSW told patient that if music therapy is  Indeed the modality of therapy we take tomorrow, he will be asked not to participate.  Therapeutic Modalities Cognitive Behavioral Therapy Motivational Interviewing    Ambrose Mantle, LCSW 08/08/2020, 3:19 PM

## 2020-08-08 NOTE — H&P (Signed)
Psychiatric Admission Assessment Adult  Patient Identification: Nathan Holloway MRN:  [id number] Date of Evaluation:  08/08/2020 Chief Complaint:  Schizoaffective disorder, bipolar type (HCC) [F25.0] Principal Diagnosis: <principal problem not specified> Diagnosis:  Active Problems:   Schizoaffective disorder, bipolar type (HCC)  History of Present Illness:Patient is seen and examined.  Patient is a 24 year old male with a known past psychiatric history significant for bipolar disorder with psychotic features but I suspect is more likely schizoaffective disorder; bipolar type who had been hospitalized at our facility recently from 07/05/2020 to 07/11/2020.  He presented to the [facility] behavioral health urgent care center as a walk-in on 08/06/2020.  The patient was reportedly accompanied by his mother.  He was noted to be significantly manic at that time.  The mother reported that the patient had not been taking medications.  He was noted to be pressured, restless, agitated.  He was admitted to the behavioral health urgent care center, and arrangements were made for transfer to the inpatient facility.  His Depakote and Zyprexa were reordered.  He was transferred to our facility on the evening of 12/17.  This morning on examination the patient is severely manic.  He is pressured, agitated, delusional.  He is extremely tangential.  He stated that he has degrees in psychology and other college degrees.  He stated he needs to be on something holistic.  He will only take holistic medications.  He stated marijuana is the only thing that calms him.  He stated he has not eaten in 5 days.  His explanation for having not taken his medications was that he was unable to find them.  He was admitted to the hospital for evaluation and stabilization.   Associated Signs/Symptoms: Depression Symptoms:  insomnia, psychomotor agitation, fatigue, difficulty concentrating, disturbed sleep, Duration of Depression  Symptoms: Greater than two weeks  (Hypo) Manic Symptoms:  Delusions, Distractibility, Elevated Mood, Flight of Ideas, Grandiosity, Impulsivity, Irritable Mood, Labiality of Mood, Anxiety Symptoms:  Excessive Worry, Psychotic Symptoms:  Delusions, Paranoia, Duration of Psychotic Symptoms: No data recorded PTSD Symptoms: Negative Total Time spent with patient: 30 minutes  Past Psychiatric History: This is the patient's fifth or 6 psychiatric admission.  He had been previously diagnosed with bipolar disorder, schizoaffective disorder as well as schizophrenia in the past.  He admitted to continued marijuana use, as well as tobacco use.  Is the patient at risk to self? Yes.    Has the patient been a risk to self in the past 6 months? Yes.    Has the patient been a risk to self within the distant past? No.  Is the patient a risk to others? No.  Has the patient been a risk to others in the past 6 months? No.  Has the patient been a risk to others within the distant past? No.   Prior Inpatient Therapy:   Prior Outpatient Therapy:    Alcohol Screening: 1. How often do you have a drink containing alcohol?: Never 2. How many drinks containing alcohol do you have on a typical day when you are drinking?: 1 or 2 3. How often do you have six or more drinks on one occasion?: Never AUDIT-C Score: 0 4. How often during the last year have you found that you were not able to stop drinking once you had started?: Never 5. How often during the last year have you failed to do what was normally expected from you because of drinking?: Never 6. How often during the last year have  you needed a first drink in the morning to get yourself going after a heavy drinking session?: Never 7. How often during the last year have you had a feeling of guilt of remorse after drinking?: Never 8. How often during the last year have you been unable to remember what happened the night before because you had been drinking?:  Never 9. Have you or someone else been injured as a result of your drinking?: No 10. Has a relative or friend or a doctor or another health worker been concerned about your drinking or suggested you cut down?: No Alcohol Use Disorder Identification Test Final Score (AUDIT): 0 Substance Abuse History in the last 12 months:  Yes.   Consequences of Substance Abuse: Medical Consequences:  It appears that his marijuana use is clearly contributing to his psychiatric illness. Previous Psychotropic Medications: Yes  Psychological Evaluations: Yes  Past Medical History:  Past Medical History:  Diagnosis Date  . Manic behavior (HCC)   . Psychotic affective disorder (HCC) 08/30/2015   History reviewed. No pertinent surgical history. Family History: History reviewed. No pertinent family history. Family Psychiatric  History: He reported a family member with anxiety, but no one with bipolar disorder or any thought disorders. Tobacco Screening: Have you used any form of tobacco in the last 30 days? (Cigarettes, Smokeless Tobacco, Cigars, and/or Pipes): No Social History:  Social History   Substance and Sexual Activity  Alcohol Use No     Social History   Substance and Sexual Activity  Drug Use Not Currently  . Types: Marijuana   Comment: recent usage while out of town this week    Additional Social History:                           Allergies:   Allergies  Allergen Reactions  . Risperidone And Related Other (See Comments)    Diastatic   Lab Results:  Results for orders placed or performed during the hospital encounter of 08/06/20 (from the past 48 hour(s))  POCT urinalysis dip (device)     Status: None   Collection Time: 08/06/20  2:53 PM  Result Value Ref Range   Glucose, UA NEGATIVE NEGATIVE mg/dL   Bilirubin Urine NEGATIVE NEGATIVE   Ketones, ur NEGATIVE NEGATIVE mg/dL   Specific Gravity, Urine >=1.030 1.005 - 1.030   Hgb urine dipstick NEGATIVE NEGATIVE   pH 6.0 5.0  - 8.0   Protein, ur NEGATIVE NEGATIVE mg/dL   Urobilinogen, UA 0.2 0.0 - 1.0 mg/dL   Nitrite NEGATIVE NEGATIVE   Leukocytes,Ua NEGATIVE NEGATIVE    Comment: Biochemical Testing Only. Please order routine urinalysis from main lab if confirmatory testing is needed.  Hemoglobin A1c     Status: Abnormal   Collection Time: 08/06/20  3:11 PM  Result Value Ref Range   Hgb A1c MFr Bld 4.6 (L) 4.8 - 5.6 %    Comment: (NOTE) Pre diabetes:          5.7%-6.4%  Diabetes:              >6.4%  Glycemic control for   <7.0% adults with diabetes    Mean Plasma Glucose 85.32 mg/dL    Comment: Performed at [facility], [address], [place], [place] [address]  Ethanol     Status: None   Collection Time: 08/06/20  3:11 PM  Result Value Ref Range   Alcohol, Ethyl (B) <10 <10 mg/dL    Comment: (  NOTE) Lowest detectable limit for serum alcohol is 10 mg/dL.  For medical purposes only. Performed at [facility], [address], [place], [place] [address]   Resp Panel by RT-PCR (Flu A&B, Covid) Nasopharyngeal Swab     Status: None   Collection Time: 08/06/20  3:12 PM   Specimen: Nasopharyngeal Swab; Nasopharyngeal(NP) swabs in vial transport medium  Result Value Ref Range   SARS Coronavirus 2 by RT PCR NEGATIVE NEGATIVE    Comment: (NOTE) SARS-CoV-2 target nucleic acids are NOT DETECTED.  The SARS-CoV-2 RNA is generally detectable in upper respiratory specimens during the acute phase of infection. The lowest concentration of SARS-CoV-2 viral copies this assay can detect is 138 copies/mL. A negative result does not preclude SARS-Cov-2 infection and should not be used as the sole basis for treatment or other patient management decisions. A negative result may occur with  improper specimen collection/handling, submission of specimen other than nasopharyngeal swab, presence of viral mutation(s) within the areas targeted by this assay, and inadequate number of viral copies(<138  copies/mL). A negative result must be combined with clinical observations, patient history, and epidemiological information. The expected result is Negative.  Fact Sheet for Patients:  BloggerCourse.com  Fact Sheet for Healthcare Providers:  SeriousBroker.it  This test is no t yet approved or cleared by the [place] FDA and  has been authorized for detection and/or diagnosis of SARS-CoV-2 by FDA under an Emergency Use Authorization (EUA). This EUA will remain  in effect (meaning this test can be used) for the duration of the COVID-19 declaration under Section 564(b)(1) of the Act, 21 U.S.C.section 360bbb-3(b)(1), unless the authorization is terminated  or revoked sooner.       Influenza A by PCR NEGATIVE NEGATIVE   Influenza B by PCR NEGATIVE NEGATIVE    Comment: (NOTE) The Xpert Xpress SARS-CoV-2/FLU/RSV plus assay is intended as an aid in the diagnosis of influenza from Nasopharyngeal swab specimens and should not be used as a sole basis for treatment. Nasal washings and aspirates are unacceptable for Xpert Xpress SARS-CoV-2/FLU/RSV testing.  Fact Sheet for Patients: BloggerCourse.com  Fact Sheet for Healthcare Providers: SeriousBroker.it  This test is not yet approved or cleared by the [place] FDA and has been authorized for detection and/or diagnosis of SARS-CoV-2 by FDA under an Emergency Use Authorization (EUA). This EUA will remain in effect (meaning this test can be used) for the duration of the COVID-19 declaration under Section 564(b)(1) of the Act, 21 U.S.C. section 360bbb-3(b)(1), unless the authorization is terminated or revoked.  Performed at [facility], [address], [place], [place] [address]   CBC with Differential/Platelet     Status: Abnormal   Collection Time: 08/06/20  3:15 PM  Result Value Ref Range   WBC 11.1 (H) 4.0 - 10.5 K/uL    RBC 5.55 4.22 - 5.81 MIL/uL   Hemoglobin 15.8 13.0 - 17.0 g/dL   HCT 54.2 70.6 - 23.7 %   MCV 88.3 80.0 - 100.0 fL   MCH 28.5 26.0 - 34.0 pg   MCHC 32.2 30.0 - 36.0 g/dL   RDW 62.8 31.5 - 17.6 %   Platelets 278 150 - 400 K/uL   nRBC 0.0 0.0 - 0.2 %   Neutrophils Relative % 65 %   Neutro Abs 7.3 1.7 - 7.7 K/uL   Lymphocytes Relative 23 %   Lymphs Abs 2.5 0.7 - 4.0 K/uL   Monocytes Relative 9 %   Monocytes Absolute 1.0 0.1 -  1.0 K/uL   Eosinophils Relative 1 %   Eosinophils Absolute 0.2 0.0 - 0.5 K/uL   Basophils Relative 1 %   Basophils Absolute 0.1 0.0 - 0.1 K/uL   Immature Granulocytes 1 %   Abs Immature Granulocytes 0.05 0.00 - 0.07 K/uL    Comment: Performed at [facility], [address] [place], [place] [address]  Comprehensive metabolic panel     Status: Abnormal   Collection Time: 08/06/20  3:15 PM  Result Value Ref Range   Sodium 139 135 - 145 mmol/L   Potassium 3.9 3.5 - 5.1 mmol/L   Chloride 102 98 - 111 mmol/L   CO2 21 (L) 22 - 32 mmol/L   Glucose, Bld 88 70 - 99 mg/dL    Comment: Glucose reference range applies only to samples taken after fasting for at least 8 hours.   BUN 16 6 - 20 mg/dL   Creatinine, Ser 6.041.43 (H) 0.61 - 1.24 mg/dL   Calcium 54.010.3 8.9 - 98.110.3 mg/dL   Total Protein 8.0 6.5 - 8.1 g/dL   Albumin 4.7 3.5 - 5.0 g/dL   AST 18 15 - 41 U/L   ALT 14 0 - 44 U/L   Alkaline Phosphatase 78 38 - 126 U/L   Total Bilirubin 1.0 0.3 - 1.2 mg/dL   GFR, Estimated >19>60 >14>60 mL/min    Comment: (NOTE) Calculated using the CKD-EPI Creatinine Equation (2021)    Anion gap 16 (H) 5 - 15    Comment: Performed at [facility], [address] [place], [place] [address]  Valproic acid level     Status: Abnormal   Collection Time: 08/06/20  3:15 PM  Result Value Ref Range   Valproic Acid Lvl 27 (L) 50.0 - 100.0 ug/mL    Comment: Performed at [facility], [address] [place], [place] [address]  POC SARS Coronavirus 2 Ag     Status: None    Collection Time: 08/06/20  3:18 PM  Result Value Ref Range   SARS Coronavirus 2 Ag NEGATIVE NEGATIVE    Comment: (NOTE) SARS-CoV-2 antigen NOT DETECTED.   Negative results are presumptive.  Negative results do not preclude SARS-CoV-2 infection and should not be used as the sole basis for treatment or other patient management decisions, including infection  control decisions, particularly in the presence of clinical signs and  symptoms consistent with COVID-19, or in those who have been in contact with the virus.  Negative results must be combined with clinical observations, patient history, and epidemiological information. The expected result is Negative.  Fact Sheet for Patients: https://sanders-williams.net/https://www.fda.gov/media/139754/download  Fact Sheet for Healthcare Providers: https://martinez.com/https://www.fda.gov/media/139753/download   This test is not yet approved or cleared by the [place] FDA and  has been authorized for detection and/or diagnosis of SARS-CoV-2 by FDA under an Emergency Use Authorization (EUA).  This EUA will remain in effect (meaning this test can be used) for the duration of  the C OVID-19 declaration under Section 564(b)(1) of the Act, 21 U.S.C. section 360bbb-3(b)(1), unless the authorization is terminated or revoked sooner.      Blood Alcohol level:  Lab Results  Component Value Date   [facility] 08/06/2020   ETH <10 07/04/2020    Metabolic Disorder Labs:  Lab Results  Component Value Date   HGBA1C 4.6 (L) 08/06/2020   MPG 85.32 08/06/2020   MPG 82.45 07/04/2020   Lab Results  Component Value Date   PROLACTIN 3.7 (L) 07/04/2020  Lab Results  Component Value Date   CHOL 193 07/04/2020   TRIG 62 07/04/2020   HDL 76 07/04/2020   CHOLHDL 2.5 07/04/2020   VLDL 12 07/04/2020   LDLCALC 105 (H) 07/04/2020    Current Medications: Current Facility-Administered Medications  Medication Dose Route Frequency Provider Last Rate Last Admin  . acetaminophen (TYLENOL) tablet  650 mg  650 mg Oral Q6H PRN Rankin, Shuvon B, NP      . alum & mag hydroxide-simeth (MAALOX/MYLANTA) 200-200-20 MG/5ML suspension 30 mL  30 mL Oral Q4H PRN Rankin, Shuvon B, NP      . divalproex (DEPAKOTE ER) 24 hr tablet 750 mg  750 mg Oral Q supper Antonieta Pert, MD      . OLANZapine zydis [facility]) disintegrating tablet 10 mg  10 mg Oral Q8H PRN Antonieta Pert, MD       And  . LORazepam (ATIVAN) tablet 1 mg  1 mg Oral Q6H PRN Antonieta Pert, MD       And  . ziprasidone (GEODON) injection 20 mg  20 mg Intramuscular Q6H PRN Antonieta Pert, MD      . LORazepam (ATIVAN) tablet 2 mg  2 mg Oral NOW Jola Babinski, Marlane Mingle, MD      . magnesium hydroxide (MILK OF MAGNESIA) suspension 30 mL  30 mL Oral Daily PRN Rankin, Shuvon B, NP      . OLANZapine zydis (ZYPREXA) disintegrating tablet 20 mg  20 mg Oral QHS Antonieta Pert, MD      . traZODone (DESYREL) tablet 150 mg  150 mg Oral QHS PRN Rankin, Shuvon B, NP   150 mg at 08/07/20 2319   PTA Medications: Medications Prior to Admission  Medication Sig Dispense Refill Last Dose  . divalproex (DEPAKOTE) 250 MG DR tablet Take 3 tablets (750 mg total) by mouth daily with supper.     . metFORMIN (GLUCOPHAGE) 500 MG tablet Take 1 tablet (500 mg total) by mouth daily with breakfast. (Patient not taking: No sig reported) 30 tablet 0   . OLANZapine zydis (ZYPREXA) 15 MG disintegrating tablet Take 1 tablet (15 mg total) by mouth at bedtime. (Patient not taking: No sig reported) 30 tablet 2   . traZODone (DESYREL) 100 MG tablet Take 1 tablet (100 mg total) by mouth at bedtime as needed for sleep. (Patient not taking: No sig reported) 30 tablet 0     Musculoskeletal: Strength & Muscle Tone: within normal limits Gait & Station: normal Patient leans: N/A  Psychiatric Specialty Exam: Physical Exam Vitals and nursing note reviewed.  HENT:     Head: Normocephalic and atraumatic.  Pulmonary:     Effort: Pulmonary effort is normal.   Neurological:     Mental Status: He is alert.     Review of Systems  Blood pressure 129/84, pulse 64, temperature 97.8 F (36.6 C), temperature source Oral, resp. rate 18, height 5\' 7"  (1.702 m), weight 72.1 kg, SpO2 100 %.Body mass index is 24.9 kg/m.  General Appearance: Fairly Groomed  Eye Contact:  Minimal  Speech:  Pressured  Volume:  Increased  Mood:  Anxious, Dysphoric and Euphoric  Affect:  Labile  Thought Process:  Disorganized and Descriptions of Associations: Tangential  Orientation:  Negative  Thought Content:  Delusions, Paranoid Ideation, Rumination and Tangential  Suicidal Thoughts:  No  Homicidal Thoughts:  No  Memory:  Immediate;   Poor Recent;   Poor Remote;   Poor  Judgement:  Impaired  Insight:  Lacking  Psychomotor Activity:  Increased  Concentration:  Concentration: Poor  Recall:  Poor  Fund of Knowledge:  Poor  Language:  Good  Akathisia:  Negative  Handed:  Right  AIMS (if indicated):     Assets:  Desire for Improvement Resilience  ADL's:  Intact  Cognition:  WNL  Sleep:  Number of Hours: 4.25    Treatment Plan Summary: Daily contact with patient to assess and evaluate symptoms and progress in treatment, Medication management and Plan : Patient is seen and examined.  Patient is a 24 year old male with the above-stated past psychiatric history who was admitted secondary to exacerbation of his schizoaffective disorder; bipolar type.  He will be admitted to the hospital.  He will be integrated in the milieu.  He will be encouraged to attend groups.  His Depakote and Zyprexa have already been restarted.  I am going to increase his Zyprexa to 20 mg p.o. nightly.  We will continue the Depakote at 750 mg p.o. every afternoon.  The Metformin had been added previously to potentially decrease weight gain associated with Zyprexa.  This will be stopped.  We will also continue trazodone and I will add the Zyprexa as needed agitation protocol.  I will give him 2 mg  of Ativan right now to calm down and to transfer him to the [address] which would be more appropriate for him.  Urine drug screen was not obtained and we will attempt to get that. Review of his admission laboratories revealed an elevated creatinine at 1.43.  His last creatinine 4 weeks ago was normal at 1.21.  Previously it it also been mildly elevated.  This is most likely secondary to decreased fluid intake because of his mania.  Liver function enzymes were normal.  CBC was essentially normal.  Differential was normal.  His valproic acid level on 12/16 was 27.  Hemoglobin A1c was 4.6.  Urinalysis appeared to be normal except for an elevated specific gravity of 1.030.  Unfortunately drug screen was not obtained.  EKG showed a sinus arrhythmia with a normal QTc interval.  His vital signs are stable, he is afebrile.  Observation Level/Precautions:  15 minute checks  Laboratory:  Chemistry Profile  Psychotherapy:    Medications:    Consultations:    Discharge Concerns:    Estimated LOS:  Other:     Physician Treatment Plan for Primary Diagnosis: <principal problem not specified> Long Term Goal(s): Improvement in symptoms so as ready for discharge  Short Term Goals: Ability to identify changes in lifestyle to reduce recurrence of condition will improve, Ability to verbalize feelings will improve, Ability to demonstrate self-control will improve, Ability to identify and develop effective coping behaviors will improve, Ability to maintain clinical measurements within normal limits will improve, Compliance with prescribed medications will improve and Ability to identify triggers associated with substance abuse/mental health issues will improve  Physician Treatment Plan for Secondary Diagnosis: Active Problems:   Schizoaffective disorder, bipolar type (HCC)  Long Term Goal(s): Improvement in symptoms so as ready for discharge  Short Term Goals: Ability to identify changes in lifestyle to reduce  recurrence of condition will improve, Ability to verbalize feelings will improve, Ability to demonstrate self-control will improve, Ability to identify and develop effective coping behaviors will improve, Ability to maintain clinical measurements within normal limits will improve, Compliance with prescribed medications will improve and Ability to identify triggers associated with substance abuse/mental health issues will improve  I certify that inpatient services furnished can reasonably  be expected to improve the patient's condition.    Antonieta Pert, MD 12/18/202112:35 PM

## 2020-08-08 NOTE — Progress Notes (Signed)
D: Patient presented to [facility] from the [facility] on 12/17 voluntarily for manic behaviors. Patient hyperverbal and grandiose at time of assessment. Patient extremely manic and tangential in conversation but is redirectable. Patient denies SI/HI at the time of assessment. Patient also denies AH/VH at this time. Patient contracts for safety.  A: Provided positive reinforcement and encouragement. Provided redirection of behavior.   R: Patient cooperative and receptive to efforts. Patient remains safe on the unit.

## 2020-08-08 NOTE — Progress Notes (Signed)
DAR NOTE: Patient presents with paranoid and manic behavior.   Speech is pressured, rapid with tangential thoughts process.  Patient observed skipping and jumping up in the hallway.  Denies suicidal thoughts, pain, auditory and visual hallucinations.   Maintained on routine safety checks.  Medications given as prescribed.  Support and encouragement offered as needed.  Attended group and participated.  Patient is safe on and off rhe unit.

## 2020-08-08 NOTE — Progress Notes (Signed)
   08/08/20 2055  COVID-19 Daily Checkoff  Have you had a fever (temp > 37.80C/100F)  in the past 24 hours?  No  If you have had runny nose, nasal congestion, sneezing in the past 24 hours, has it worsened? No  COVID-19 EXPOSURE  Have you traveled outside the state in the past 14 days? No  Have you been in contact with someone with a confirmed diagnosis of COVID-19 or PUI in the past 14 days without wearing appropriate PPE? No  Have you been living in the same home as a person with confirmed diagnosis of COVID-19 or a PUI (household contact)? No  Have you been diagnosed with COVID-19? No

## 2020-08-08 NOTE — Tx Team (Signed)
Initial Treatment Plan 08/08/2020 1:23 AM [name] HEN:[id number]    PATIENT STRESSORS: Marital or family conflict   PATIENT STRENGTHS: Average or above average intelligence Physical Health Supportive family/friends   PATIENT IDENTIFIED PROBLEMS: Manic Episode  Anxiety  (Pt unable to identify any goals)                 DISCHARGE CRITERIA:  Improved stabilization in mood, thinking, and/or behavior Motivation to continue treatment in a less acute level of care Verbal commitment to aftercare and medication compliance  PRELIMINARY DISCHARGE PLAN: Outpatient therapy Return to previous living arrangement Return to previous work or school arrangements  PATIENT/FAMILY INVOLVEMENT: This treatment plan has been presented to and reviewed with the patient, Cristobal Advani, and/or family member.  The patient and family have been given the opportunity to ask questions and make suggestions.  Mancel Bale, RN 08/08/2020, 1:23 AM

## 2020-08-08 NOTE — [facility] Suicide Risk Assessment (Signed)
[facility] Admission Suicide Risk Assessment   Nursing information obtained from:    Demographic factors:  Male Current Mental Status:  NA Loss Factors:  NA Historical Factors:  Impulsivity Risk Reduction Factors:  Living with another person, especially a relative  Total Time spent with patient: 30 minutes Principal Problem: <principal problem not specified> Diagnosis:  Active Problems:   Schizoaffective disorder, bipolar type (HCC)  Subjective Data: Patient is seen and examined.  Patient is a 24 year old male with a known past psychiatric history significant for bipolar disorder with psychotic features but I suspect is more likely schizoaffective disorder; bipolar type who had been hospitalized at our facility recently from 07/05/2020 to 07/11/2020.  He presented to the [facility] behavioral health urgent care center as a walk-in on 08/06/2020.  The patient was reportedly accompanied by his mother.  He was noted to be significantly manic at that time.  The mother reported that the patient had not been taking medications.  He was noted to be pressured, restless, agitated.  He was admitted to the behavioral health urgent care center, and arrangements were made for transfer to the inpatient facility.  His Depakote and Zyprexa were reordered.  He was transferred to our facility on the evening of 12/17.  This morning on examination the patient is severely manic.  He is pressured, agitated, delusional.  He is extremely tangential.  He stated that he has degrees in psychology and other college degrees.  He stated he needs to be on something holistic.  He will only take holistic medications.  He stated marijuana is the only thing that calms him.  He stated he has not eaten in 5 days.  His explanation for having not taken his medications was that he was unable to find them.  He was admitted to the hospital for evaluation and stabilization.  Continued Clinical Symptoms:  Alcohol Use Disorder Identification  Test Final Score (AUDIT): 0 The "Alcohol Use Disorders Identification Test", Guidelines for Use in Primary Care, Second Edition.  World Science writer [facility]). Score between 0-7:  no or low risk or alcohol related problems. Score between 8-15:  moderate risk of alcohol related problems. Score between 16-19:  high risk of alcohol related problems. Score 20 or above:  warrants further diagnostic evaluation for alcohol dependence and treatment.   CLINICAL FACTORS:   Bipolar Disorder:   Mixed State Schizophrenia:   Less than 56 years old Paranoid or undifferentiated type   Musculoskeletal: Strength & Muscle Tone: within normal limits Gait & Station: normal Patient leans: N/A  Psychiatric Specialty Exam: Physical Exam Vitals and nursing note reviewed.  HENT:     Head: Normocephalic and atraumatic.  Pulmonary:     Effort: Pulmonary effort is normal.  Neurological:     General: No focal deficit present.     Mental Status: He is alert.     Review of Systems  Blood pressure 129/84, pulse 64, temperature 97.8 F (36.6 C), temperature source Oral, resp. rate 18, height 5\' 7"  (1.702 m), weight 72.1 kg, SpO2 100 %.Body mass index is 24.9 kg/m.  General Appearance: Fairly Groomed  Eye Contact:  Minimal  Speech:  Pressured  Volume:  Increased  Mood:  Anxious, Euphoric and Irritable  Affect:  Labile  Thought Process:  Disorganized and Descriptions of Associations: Tangential  Orientation:  Negative  Thought Content:  Delusions, Paranoid Ideation, Rumination and Tangential  Suicidal Thoughts:  No  Homicidal Thoughts:  No  Memory:  Immediate;   Poor Recent;  Poor Remote;   Poor  Judgement:  Impaired  Insight:  Lacking  Psychomotor Activity:  Increased  Concentration:  Concentration: Poor and Attention Span: Poor  Recall:  Poor  Fund of Knowledge:  Fair  Language:  Good  Akathisia:  Negative  Handed:  Right  AIMS (if indicated):     Assets:  Desire for  Improvement Resilience  ADL's:  Intact  Cognition:  WNL  Sleep:  Number of Hours: 4.25      COGNITIVE FEATURES THAT CONTRIBUTE TO RISK:  Thought constriction (tunnel vision)    SUICIDE RISK:   Moderate:  Frequent suicidal ideation with limited intensity, and duration, some specificity in terms of plans, no associated intent, good self-control, limited dysphoria/symptomatology, some risk factors present, and identifiable protective factors, including available and accessible social support.  PLAN OF CARE: Patient is seen and examined.  Patient is a 24 year old male with the above-stated past psychiatric history who was admitted with delusional thinking, mania, agitation.  He will be admitted to the hospital.  He will be integrated in the milieu.  He will be encouraged to attend groups.  He was placed on the [address] last evening, but the patient truly requires a higher level of care.  He will be transferred to the [address].  I suspect we may have to use forced medications.  We will continue his Depakote at 750 mg p.o. nightly, Metformin at 500 mg p.o. daily, I will increase his Zyprexa to 20 mg p.o. nightly secondary to his continued mania as well as lack of sleep.  Trazodone will also be available for sleep as needed.  Review of his admission laboratories revealed an elevated creatinine at 1.43.  His last creatinine 4 weeks ago was normal at 1.21.  Previously it it also been mildly elevated.  This is most likely secondary to decreased fluid intake because of his mania.  Liver function enzymes were normal.  CBC was essentially normal.  Differential was normal.  His valproic acid level on 12/16 was 27.  Hemoglobin A1c was 4.6.  Urinalysis appeared to be normal except for an elevated specific gravity of 1.030.  Unfortunately drug screen was not obtained.  EKG showed a sinus arrhythmia with a normal QTc interval.  His vital signs are stable, he is afebrile.  I certify that inpatient services furnished  can reasonably be expected to improve the patient's condition.   Antonieta Pert, MD 08/08/2020, 12:24 PM

## 2020-08-08 NOTE — [facility] Group Notes (Signed)
LCSW Group Therapy Note  08/08/2020   10:00-11:00am   Type of Therapy and Topic:  Group Therapy: Anger Cues and Responses  Participation Level:  Active   Description of Group:   In this group, patients learned how to recognize the physical, cognitive, emotional, and behavioral responses they have to anger-provoking situations.  They identified a recent time they became angry and how they reacted.  They analyzed how their reaction was possibly beneficial and how it was possibly unhelpful.  The group discussed a variety of healthier coping skills that could help with such a situation in the future.  Focus was placed on how helpful it is to recognize the underlying emotions to our anger, because working on those can lead to a more permanent solution as well as our ability to focus on the important rather than the urgent.  Therapeutic Goals: 1. Patients will remember their last incident of anger and how they felt emotionally and physically, what their thoughts were at the time, and how they behaved. 2. Patients will identify how their behavior at that time worked for them, as well as how it worked against them. 3. Patients will explore possible new behaviors to use in future anger situations. 4. Patients will learn that anger itself is normal and cannot be eliminated, and that healthier reactions can assist with resolving conflict rather than worsening situations.  Summary of Patient Progress:  The patient shared that his most recent time of anger was "now" and said he is hungry and thirsty and nobody will help him.  He stated he has been fasting for 32 days and now is ready to eat.  He showed a cup the size of those used for water when patients are taking medication, and said that was all the water he was being allowed.  He made several delusional statements, such as "I have every mental disorder known to man" and talked about smoking marijuana and how the "weed opened up my 3rd eye."  He was  frequently disruptive during group with loud noises such as crushing cups repeatedly, eating loudly, and such.  Each time he was asked to be quiet or to sit down, he would do but then it would occur again.  The other patients made it clear he was acting in a way that was difficult for them to tolerate.  Therapeutic Modalities:   Cognitive Behavioral Therapy  Lynnell Chad

## 2020-08-08 NOTE — [facility] Group Notes (Signed)
 .  Psychoeducational Group Note  Date: 08-08-20 Time: 0900-1000    Goal Setting   Purpose of Group: This group helps to provide patients with the steps of setting a goal that is specific, measurable, attainable, realistic and time specific. A discussion on how we keep ourselves stuck with negative self talk.    Participation Level:  Active  Participation Quality:  Appropriate  Affect:  Appropriate  Cognitive:  Appropriate  Insight:  Improving  Engagement in Group:  Engaged  Additional Comments:  Pt's goal is to live in the moment and not allow his thoughts to run away from him  Vira Blanco A

## 2020-08-09 LAB — RAPID URINE DRUG SCREEN, HOSP PERFORMED
Amphetamines: NOT DETECTED
Barbiturates: NOT DETECTED
Benzodiazepines: NOT DETECTED
Cocaine: NOT DETECTED
Opiates: NOT DETECTED
Tetrahydrocannabinol: NOT DETECTED

## 2020-08-09 LAB — URINALYSIS, COMPLETE (UACMP) WITH MICROSCOPIC
Bilirubin Urine: NEGATIVE
Glucose, UA: >=500 mg/dL — AB
Hgb urine dipstick: NEGATIVE
Ketones, ur: NEGATIVE mg/dL
Leukocytes,Ua: NEGATIVE
Nitrite: NEGATIVE
Protein, ur: NEGATIVE mg/dL
Specific Gravity, Urine: 1.016 (ref 1.005–1.030)
pH: 5 (ref 5.0–8.0)

## 2020-08-09 MED ORDER — NICOTINE 14 MG/24HR TD PT24
14.0000 mg | MEDICATED_PATCH | Freq: Every day | TRANSDERMAL | Status: DC
  Administered 2020-08-09 – 2020-08-11 (×3): 14 mg via TRANSDERMAL
  Filled 2020-08-09 (×7): qty 1

## 2020-08-09 NOTE — [facility] Group Notes (Signed)
[facility] Group Notes: (Clinical Social Work)   08/09/2020      Type of Therapy:  Group Therapy   Participation Level:  Did Not Attend - was not invited due to his behavior in group yesterday specifically aimed at the group topic today of music therapy   Ambrose Mantle, LCSW 08/09/2020, 2:39 PM

## 2020-08-09 NOTE — Progress Notes (Signed)
   08/09/20 2055  COVID-19 Daily Checkoff  Have you had a fever (temp > 37.80C/100F)  in the past 24 hours?  No  If you have had runny nose, nasal congestion, sneezing in the past 24 hours, has it worsened? No  COVID-19 EXPOSURE  Have you traveled outside the state in the past 14 days? No  Have you been in contact with someone with a confirmed diagnosis of COVID-19 or PUI in the past 14 days without wearing appropriate PPE? No  Have you been living in the same home as a person with confirmed diagnosis of COVID-19 or a PUI (household contact)? No  Have you been diagnosed with COVID-19? No

## 2020-08-09 NOTE — Progress Notes (Signed)
[place] NOVEL CORONAVIRUS (COVID-19) DAILY CHECK-OFF SYMPTOMS - answer yes or no to each - every day NO YES  Have you had a fever in the past 24 hours?  . Fever (Temp > 37.80C / 100F) X   Have you had any of these symptoms in the past 24 hours? . New Cough .  Sore Throat  .  Shortness of Breath .  Difficulty Breathing .  Unexplained Body Aches   X   Have you had any one of these symptoms in the past 24 hours not related to allergies?   . Runny Nose .  Nasal Congestion .  Sneezing   X   If you have had runny nose, nasal congestion, sneezing in the past 24 hours, has it worsened?  X   EXPOSURES - check yes or no X   Have you traveled outside the state in the past 14 days?  X   Have you been in contact with someone with a confirmed diagnosis of COVID-19 or PUI in the past 14 days without wearing appropriate PPE?  X   Have you been living in the same home as a person with confirmed diagnosis of COVID-19 or a PUI (household contact)?    X   Have you been diagnosed with COVID-19?    X              What to do next: Answered NO to all: Answered YES to anything:   Proceed with unit schedule Follow the [facility] Inpatient Flowsheet.   
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
## 2020-08-09 NOTE — [facility] Suicide Risk Assessment (Signed)
[facility] INPATIENT:  Family/Significant Other Suicide Prevention Education   Mother is very concerned that the patient goes off his medicines when he leaves the hospital.  She is also concerned that he is using substances and that this may lead to his mania.   Suicide Prevention Education:  Education Completed; Mother Loyed Wilmes [phone],  (name of family member/significant other) has been identified by the patient as the family member/significant other with whom the patient will be residing, and identified as the person(s) who will aid the patient in the event of a mental health crisis (suicidal ideations/suicide attempt).  With written consent from the patient, the family member/significant other has been provided the following suicide prevention education, prior to the and/or following the discharge of the patient.  The suicide prevention education provided includes the following:  Suicide risk factors  Suicide prevention and interventions  National Suicide Hotline telephone number  [facility] assessment telephone number  [facility] Emergency Assistance 911  [facility] and/or Residential Mobile Crisis Unit telephone number  Request made of family/significant other to:  Remove weapons (e.g., guns, rifles, knives), all items previously/currently identified as safety concern.    Remove drugs/medications (over-the-counter, prescriptions, illicit drugs), all items previously/currently identified as a safety concern.  The family member/significant other verbalizes understanding of the suicide prevention education information provided.  The family member/significant other agrees to remove the items of safety concern listed above.    Carloyn Jaeger Grossman-Orr 08/09/2020, 5:03 PM

## 2020-08-09 NOTE — Progress Notes (Signed)
   08/09/20 1100  Psych Admission Type (Psych Patients Only)  Admission Status Voluntary  Psychosocial Assessment  Patient Complaints None  Eye Contact Fair  Facial Expression Anxious  Affect Preoccupied;Anxious  Speech Pressured;Tangential  Interaction Attention-seeking  Motor Activity Hyperactive  Appearance/Hygiene Unremarkable  Behavior Characteristics Anxious  Mood Pleasant;Preoccupied  Thought Process  Coherency Loose associations;Tangential;Disorganized  Content Preoccupation  Delusions Grandeur;Paranoid  Perception WDL  Hallucination None reported or observed  Judgment Poor  Confusion Mild  Danger to Self  Current suicidal ideation? Denies  Danger to Others  Danger to Others None reported or observed

## 2020-08-09 NOTE — Progress Notes (Signed)
[facility] MD Progress Note  08/09/2020 11:55 AM Nathan Holloway  MRN:  [id number] Subjective: Patient is a 24 year old male with a past psychiatric history significant for bipolar disorder with psychotic features versus schizoaffective disorder; bipolar type.  He was admitted secondary to noncompliance with his treatment and reappearance of manic and psychotic symptoms.  Objective: Patient is seen and examined.  Patient is a 24 year old male with the above-stated past psychiatric history was seen in follow-up.  He still remains significantly manic.  He remains pressured, tangential, delusional with regard to all of his multiple degrees.  He continues to state that he will not take medications, and he only wants medicines that help him.  That medicine has yet to be found.  We reviewed the fact that he had been hospitalized now for at least the fifth time secondary to noncompliance with his treatment.  He stated the reason why he stopped taking all of his medicines is because they do not help.  So far it does appear that he is taking his medications at this point.  His blood pressure is mildly elevated this morning.  Is 145/72, and repeat was 147/87.  Pulse is 85.  He is afebrile.  Pulse oximetry was 100% on room air.  He did sleep 6.5 hours last night.  Principal Problem: <principal problem not specified> Diagnosis: Active Problems:   Schizoaffective disorder, bipolar type (HCC)  Total Time spent with patient: 20 minutes  Past Psychiatric History: See admission H&P  Past Medical History:  Past Medical History:  Diagnosis Date  . Manic behavior (HCC)   . Psychotic affective disorder (HCC) 08/30/2015   History reviewed. No pertinent surgical history. Family History: History reviewed. No pertinent family history. Family Psychiatric  History: See admission H&P Social History:  Social History   Substance and Sexual Activity  Alcohol Use No     Social History   Substance and Sexual Activity  Drug  Use Not Currently  . Types: Marijuana   Comment: recent usage while out of town this week    Social History   Socioeconomic History  . Marital status: Single    Spouse name: Not on file  . Number of children: Not on file  . Years of education: Not on file  . Highest education level: Not on file  Occupational History  . Not on file  Tobacco Use  . Smoking status: Never Smoker  . Smokeless tobacco: Never Used  Vaping Use  . Vaping Use: Every day  . Substances: Nicotine, Flavoring  Substance and Sexual Activity  . Alcohol use: No  . Drug use: Not Currently    Types: Marijuana    Comment: recent usage while out of town this week  . Sexual activity: Not Currently  Other Topics Concern  . Not on file  Social History Narrative  . Not on file   Social Determinants of Health   Financial Resource Strain: Not on file  Food Insecurity: Not on file  Transportation Needs: Not on file  Physical Activity: Not on file  Stress: Not on file  Social Connections: Not on file   Additional Social History:                         Sleep: Good  Appetite:  Fair  Current Medications: Current Facility-Administered Medications  Medication Dose Route Frequency Provider Last Rate Last Admin  . acetaminophen (TYLENOL) tablet 650 mg  650 mg Oral Q6H PRN Rankin, Shuvon B, NP  650 mg at 08/08/20 2054  . alum & mag hydroxide-simeth (MAALOX/MYLANTA) 200-200-20 MG/5ML suspension 30 mL  30 mL Oral Q4H PRN Rankin, Shuvon B, NP      . divalproex (DEPAKOTE ER) 24 hr tablet 750 mg  750 mg Oral Q supper Antonieta Pert, MD   750 mg at 08/08/20 1822  . OLANZapine zydis (ZYPREXA) disintegrating tablet 10 mg  10 mg Oral Q8H PRN Antonieta Pert, MD       And  . LORazepam (ATIVAN) tablet 1 mg  1 mg Oral Q6H PRN Antonieta Pert, MD   1 mg at 08/08/20 2053   And  . ziprasidone (GEODON) injection 20 mg  20 mg Intramuscular Q6H PRN Antonieta Pert, MD      . magnesium hydroxide (MILK OF  MAGNESIA) suspension 30 mL  30 mL Oral Daily PRN Rankin, Shuvon B, NP      . nicotine (NICODERM CQ - dosed in mg/24 hours) patch 14 mg  14 mg Transdermal Daily Antonieta Pert, MD   14 mg at 08/09/20 1006  . OLANZapine zydis (ZYPREXA) disintegrating tablet 20 mg  20 mg Oral QHS Antonieta Pert, MD   20 mg at 08/08/20 2053  . traZODone (DESYREL) tablet 150 mg  150 mg Oral QHS PRN Rankin, Shuvon B, NP   150 mg at 08/08/20 2054    Lab Results: No results found for this or any previous visit (from the past 48 hour(s)).  Blood Alcohol level:  Lab Results  Component Value Date   ETH <10 08/06/2020   ETH <10 07/04/2020    Metabolic Disorder Labs: Lab Results  Component Value Date   HGBA1C 4.6 (L) 08/06/2020   MPG 85.32 08/06/2020   MPG 82.45 07/04/2020   Lab Results  Component Value Date   PROLACTIN 3.7 (L) 07/04/2020   Lab Results  Component Value Date   CHOL 193 07/04/2020   TRIG 62 07/04/2020   HDL 76 07/04/2020   CHOLHDL 2.5 07/04/2020   VLDL 12 07/04/2020   LDLCALC 105 (H) 07/04/2020    Physical Findings: AIMS: Facial and Oral Movements Muscles of Facial Expression: None, normal Lips and Perioral Area: None, normal Jaw: None, normal Tongue: None, normal,Extremity Movements Upper (arms, wrists, hands, fingers): None, normal Lower (legs, knees, ankles, toes): None, normal, Trunk Movements Neck, shoulders, hips: None, normal, Overall Severity Severity of abnormal movements (highest score from questions above): None, normal Incapacitation due to abnormal movements: None, normal Patient's awareness of abnormal movements (rate only patient's report): No Awareness, Dental Status Current problems with teeth and/or dentures?: No Does patient usually wear dentures?: No  CIWA:    COWS:     Musculoskeletal: Strength & Muscle Tone: within normal limits Gait & Station: normal Patient leans: N/A  Psychiatric Specialty Exam: Physical Exam Vitals and nursing note  reviewed.  HENT:     Head: Normocephalic and atraumatic.  Pulmonary:     Effort: Pulmonary effort is normal.  Neurological:     General: No focal deficit present.     Mental Status: He is alert and oriented to person, place, and time.     Review of Systems  Blood pressure (!) 147/87, pulse 84, temperature (!) 97.3 F (36.3 C), temperature source Oral, resp. rate 18, height 5\' 7"  (1.702 m), weight 72.1 kg, SpO2 98 %.Body mass index is 24.9 kg/m.  General Appearance: Disheveled  Eye Contact:  Fair  Speech:  Pressured  Volume:  Increased  Mood:  Anxious,  Euphoric and Irritable  Affect:  Labile  Thought Process:  Disorganized and Descriptions of Associations: Tangential  Orientation:  Negative  Thought Content:  Delusions, Paranoid Ideation, Rumination and Tangential  Suicidal Thoughts:  No  Homicidal Thoughts:  No  Memory:  Immediate;   Poor Recent;   Poor Remote;   Poor  Judgement:  Impaired  Insight:  Lacking  Psychomotor Activity:  Increased  Concentration:  Concentration: Poor and Attention Span: Poor  Recall:  Poor  Fund of Knowledge:  Fair  Language:  Good  Akathisia:  Negative  Handed:  Right  AIMS (if indicated):     Assets:  Desire for Improvement Resilience Social Support  ADL's:  Intact  Cognition:  WNL  Sleep:  Number of Hours: 6.5     Treatment Plan Summary: Daily contact with patient to assess and evaluate symptoms and progress in treatment, Medication management and Plan : Patient is seen and examined.  Patient is a 24 year old male with the above-stated past psychiatric history who is seen in follow-up.   Diagnosis: 1.  Schizoaffective disorder; bipolar type versus bipolar disorder; most recently manic, severe with psychotic features  Pertinent findings on examination today: 1.  Patient remains manic, tangential, disorganized, delusional, grandiose  Plan: 1.  Continue Depakote ER 750 mg p.o. every afternoon for mood stability. 2.  Continue  Zyprexa Zydis agitation protocol as needed. 3.  Continue Zyprexa Zydis 20 mg p.o. nightly for mood stability and psychosis. 4.  Continue trazodone 150 mg p.o. nightly as needed insomnia. 5.  Disposition planning-in progress.  Antonieta Pert, MD 08/09/2020, 11:55 AM

## 2020-08-09 NOTE — [facility] Group Notes (Signed)
Adult Psychoeducational Group Not Date:  1219/2021 Time:  8338-2505 Group Topic/Focus: PROGRESSIVE RELAXATION. A group where deep breathing is taught and tensing and relaxation muscle groups is used. Imagery is used as well.  Pts are asked to imagine 3 pillars that hold them up when they are not able to hold themselves up.  Participation Level:  Active  Participation Quality:  Appropriate  Affect:  Appropriate  Cognitive:  Oriented  Insight: Improving  Engagement in Group:  Engaged  Modes of Intervention:  Activity, Discussion, Education, and Support  Additional Comments:  Rates his energy at an 11/10  States that he was just having fun, and that is why "I am here". Has some flight of ideas at times.  Nathan Holloway A 12/19//2021`

## 2020-08-10 MED ORDER — AMLODIPINE BESYLATE 5 MG PO TABS
5.0000 mg | ORAL_TABLET | Freq: Every day | ORAL | Status: DC
  Administered 2020-08-10 – 2020-08-15 (×6): 5 mg via ORAL
  Filled 2020-08-10 (×3): qty 1
  Filled 2020-08-10: qty 3
  Filled 2020-08-10 (×4): qty 1

## 2020-08-10 NOTE — Progress Notes (Signed)
Pt stated he was feeling better. Pt stated he wanted to get D/C before Christmas, pt encouraged to talk to the Doctor     08/10/20 2000  Psych Admission Type (Psych Patients Only)  Admission Status Voluntary  Psychosocial Assessment  Patient Complaints Hyperactivity;Anxiety  Eye Contact Fair  Facial Expression Animated;Anxious  Affect Anxious;Preoccupied  Speech Pressured;Tangential  Interaction Attention-seeking;Demanding;Hypervigilant  Motor Activity Fidgety;Hyperactive;Restless  Appearance/Hygiene Unremarkable  Behavior Characteristics Cooperative;Anxious  Mood Anxious;Pleasant  Thought Process  Coherency Disorganized;Flight of ideas;Loose associations;Tangential  Content Compulsions;Obsessions;Preoccupation  Delusions Grandeur  Perception Derealization  Hallucination None reported or observed  Judgment Poor  Confusion Mild  Danger to Self  Current suicidal ideation? Denies  Danger to Others  Danger to Others None reported or observed

## 2020-08-10 NOTE — Progress Notes (Signed)
[facility] MD Progress Note  08/10/2020 12:00 PM Nathan Holloway  MRN:  [id number] Subjective:  Patient is a 24 year old male with a past psychiatric history significant for bipolar disorder with psychotic features versus schizoaffective disorder; bipolar type.  He was admitted secondary to noncompliance with his treatment and reappearance of manic and psychotic symptoms.  Objective: Patient is seen and examined.  Patient is a 24 year old male with the above-stated past psychiatric history who is seen in follow-up.  His mania continues although perhaps slightly improved.  He continues to be pressured, tangential, delusional.  He continues to complain about medication side effects, his weight, and other issues.  He continues to talk about his multiple degrees.  This is very similar to his presentation on his last hospitalization except there were different medicines that he complained of.  His compliance as an outpatient is very poor.  His blood pressure still elevated today at 145/75.  Pulse is 70.  He is afebrile.  He slept 6 hours last night.  No new laboratories.  Principal Problem: <principal problem not specified> Diagnosis: Active Problems:   Schizoaffective disorder, bipolar type (HCC)  Total Time spent with patient: 20 minutes  Past Psychiatric History: See admission H&P  Past Medical History:  Past Medical History:  Diagnosis Date  . Manic behavior (HCC)   . Psychotic affective disorder (HCC) 08/30/2015   History reviewed. No pertinent surgical history. Family History: History reviewed. No pertinent family history. Family Psychiatric  History: See admission H&P Social History:  Social History   Substance and Sexual Activity  Alcohol Use No     Social History   Substance and Sexual Activity  Drug Use Not Currently  . Types: Marijuana   Comment: recent usage while out of town this week    Social History   Socioeconomic History  . Marital status: Single    Spouse name: Not on  file  . Number of children: Not on file  . Years of education: Not on file  . Highest education level: Not on file  Occupational History  . Not on file  Tobacco Use  . Smoking status: Never Smoker  . Smokeless tobacco: Never Used  Vaping Use  . Vaping Use: Every day  . Substances: Nicotine, Flavoring  Substance and Sexual Activity  . Alcohol use: No  . Drug use: Not Currently    Types: Marijuana    Comment: recent usage while out of town this week  . Sexual activity: Not Currently  Other Topics Concern  . Not on file  Social History Narrative  . Not on file   Social Determinants of Health   Financial Resource Strain: Not on file  Food Insecurity: Not on file  Transportation Needs: Not on file  Physical Activity: Not on file  Stress: Not on file  Social Connections: Not on file   Additional Social History:                         Sleep: Fair  Appetite:  Good  Current Medications: Current Facility-Administered Medications  Medication Dose Route Frequency Provider Last Rate Last Admin  . acetaminophen (TYLENOL) tablet 650 mg  650 mg Oral Q6H PRN Rankin, Shuvon B, NP   650 mg at 08/08/20 2054  . alum & mag hydroxide-simeth (MAALOX/MYLANTA) 200-200-20 MG/5ML suspension 30 mL  30 mL Oral Q4H PRN Rankin, Shuvon B, NP      . divalproex (DEPAKOTE ER) 24 hr tablet 750 mg  750 mg  Oral Q supper Antonieta Pert, MD   750 mg at 08/09/20 1724  . OLANZapine zydis (ZYPREXA) disintegrating tablet 10 mg  10 mg Oral Q8H PRN Antonieta Pert, MD   10 mg at 08/10/20 1039   And  . LORazepam (ATIVAN) tablet 1 mg  1 mg Oral Q6H PRN Antonieta Pert, MD   1 mg at 08/08/20 2053   And  . ziprasidone (GEODON) injection 20 mg  20 mg Intramuscular Q6H PRN Antonieta Pert, MD      . magnesium hydroxide (MILK OF MAGNESIA) suspension 30 mL  30 mL Oral Daily PRN Rankin, Shuvon B, NP      . nicotine (NICODERM CQ - dosed in mg/24 hours) patch 14 mg  14 mg Transdermal Daily Antonieta Pert, MD   14 mg at 08/10/20 0742  . OLANZapine zydis (ZYPREXA) disintegrating tablet 20 mg  20 mg Oral QHS Antonieta Pert, MD   20 mg at 08/09/20 2051  . traZODone (DESYREL) tablet 150 mg  150 mg Oral QHS PRN Rankin, Shuvon B, NP   150 mg at 08/09/20 2052    Lab Results:  Results for orders placed or performed during the hospital encounter of 08/07/20 (from the past 48 hour(s))  Rapid urine drug screen (hospital performed)     Status: None   Collection Time: 08/08/20 12:35 PM  Result Value Ref Range   Opiates NONE DETECTED NONE DETECTED   Cocaine NONE DETECTED NONE DETECTED   Benzodiazepines NONE DETECTED NONE DETECTED   Amphetamines NONE DETECTED NONE DETECTED   Tetrahydrocannabinol NONE DETECTED NONE DETECTED   Barbiturates NONE DETECTED NONE DETECTED    Comment: (NOTE) DRUG SCREEN FOR MEDICAL PURPOSES ONLY.  IF CONFIRMATION IS NEEDED FOR ANY PURPOSE, NOTIFY LAB WITHIN 5 DAYS.  LOWEST DETECTABLE LIMITS FOR URINE DRUG SCREEN Drug Class                     Cutoff (ng/mL) Amphetamine and metabolites    1000 Barbiturate and metabolites    200 Benzodiazepine                 200 Tricyclics and metabolites     300 Opiates and metabolites        300 Cocaine and metabolites        300 THC                            50 Performed at [facility], [address] [place], [place] [address]   Urinalysis, Complete w Microscopic Urine, Unspecified Source     Status: Abnormal   Collection Time: 08/08/20 12:35 PM  Result Value Ref Range   Color, Urine YELLOW YELLOW   APPearance TURBID (A) CLEAR   Specific Gravity, Urine 1.016 1.005 - 1.030   pH 5.0 5.0 - 8.0   Glucose, UA >=500 (A) NEGATIVE mg/dL   Hgb urine dipstick NEGATIVE NEGATIVE   Bilirubin Urine NEGATIVE NEGATIVE   Ketones, ur NEGATIVE NEGATIVE mg/dL   Protein, ur NEGATIVE NEGATIVE mg/dL   Nitrite NEGATIVE NEGATIVE   Leukocytes,Ua NEGATIVE NEGATIVE   RBC / HPF 0-5 0 - 5 RBC/hpf   WBC, UA 0-5 0  - 5 WBC/hpf   Bacteria, UA RARE (A) NONE SEEN   Squamous Epithelial / LPF 0-5 0 - 5    Comment: Performed at [facility], [address] [place], [place] [address]  Blood Alcohol level:  Lab Results  Component Value Date   ETH <10 08/06/2020   ETH <10 07/04/2020    Metabolic Disorder Labs: Lab Results  Component Value Date   HGBA1C 4.6 (L) 08/06/2020   MPG 85.32 08/06/2020   MPG 82.45 07/04/2020   Lab Results  Component Value Date   PROLACTIN 3.7 (L) 07/04/2020   Lab Results  Component Value Date   CHOL 193 07/04/2020   TRIG 62 07/04/2020   HDL 76 07/04/2020   CHOLHDL 2.5 07/04/2020   VLDL 12 07/04/2020   LDLCALC 105 (H) 07/04/2020    Physical Findings: AIMS: Facial and Oral Movements Muscles of Facial Expression: None, normal Lips and Perioral Area: None, normal Jaw: None, normal Tongue: None, normal,Extremity Movements Upper (arms, wrists, hands, fingers): None, normal Lower (legs, knees, ankles, toes): None, normal, Trunk Movements Neck, shoulders, hips: None, normal, Overall Severity Severity of abnormal movements (highest score from questions above): None, normal Incapacitation due to abnormal movements: None, normal Patient's awareness of abnormal movements (rate only patient's report): No Awareness, Dental Status Current problems with teeth and/or dentures?: No Does patient usually wear dentures?: No  CIWA:    COWS:     Musculoskeletal: Strength & Muscle Tone: within normal limits Gait & Station: normal Patient leans: N/A  Psychiatric Specialty Exam: Physical Exam Vitals and nursing note reviewed.  HENT:     Head: Normocephalic and atraumatic.  Pulmonary:     Effort: Pulmonary effort is normal.  Neurological:     General: No focal deficit present.     Mental Status: He is alert and oriented to person, place, and time.     Review of Systems  Blood pressure (!) 145/75, pulse 70, temperature 97.6 F (36.4 C), temperature  source Oral, resp. rate 18, height 5\' 7"  (1.702 m), weight 72.1 kg, SpO2 98 %.Body mass index is 24.9 kg/m.  General Appearance: Casual  Eye Contact:  Good  Speech:  Pressured  Volume:  Increased  Mood:  Euphoric  Affect:  Labile  Thought Process:  Coherent and Descriptions of Associations: Tangential  Orientation:  Full (Time, Place, and Person)  Thought Content:  Delusions, Rumination and Tangential  Suicidal Thoughts:  No  Homicidal Thoughts:  No  Memory:  Immediate;   Poor Recent;   Poor Remote;   Poor  Judgement:  Impaired  Insight:  Lacking  Psychomotor Activity:  Increased  Concentration:  Concentration: Poor and Attention Span: Poor  Recall:  Poor  Fund of Knowledge:  Fair  Language:  Good  Akathisia:  Negative  Handed:  Right  AIMS (if indicated):     Assets:  Desire for Improvement Resilience Social Support  ADL's:  Intact  Cognition:  WNL  Sleep:  Number of Hours: 6     Treatment Plan Summary: Daily contact with patient to assess and evaluate symptoms and progress in treatment, Medication management and Plan : Patient is seen and examined.  Patient is a 24 year old male with the above-stated past psychiatric history who is seen in follow-up.   Diagnosis: 1.  Schizoaffective disorder; bipolar type versus bipolar disorder; most recently manic, severe with psychotic features 2.  Hypertension  Pertinent findings on examination today: 1.  Patient remains manic, tangential, at times disorganized, delusional and as well at times grandiose.  Plan: 1.  Continue Depakote ER 750 mg p.o. every afternoon for mood stability. 2.  Continue Zyprexa Zydis agitation protocol as needed. 3.  Continue Zyprexa Zydis 20 mg p.o. nightly for mood  stability and psychosis. 4.  Continue trazodone 150 mg p.o. nightly as needed insomnia. 5.  Add amlodipine 5 mg p.o. daily for hypertension. 6.  Disposition planning-in progress.  Antonieta PertGreg Lawson Trypp Heckmann, MD 08/10/2020, 12:00 PM

## 2020-08-10 NOTE — Progress Notes (Signed)
Recreation Therapy Notes  Patient admitted to unit 12.17.21. Due to admission within last year, no new recreation therapy assessment conducted at this time. Last assessment conducted 11.15.21. Patient reports only stressor is vaping.  Reason for admission per patient was he voluntarily came in but didn't know his mother and grandmother were bringing him to the hospital.  Coping skills remain sports, tv, exercise, meditation, deep breathing, talk, avoidance, reading, dancing, hot bath/shower in addition to video games.  Patients leisure interests remain video games, listening to music, creating music and dance.  Patient identified strengths as communication, confidence, good listener and teaching.  Patient stated he had no areas of improvement.  Patient reports goal of discharging as soon as possible because he wants to go to a concert.  Patient denies SI, HI, AVH at this time.      Caroll Rancher, LRT/CTRS     Caroll Rancher A 08/10/2020 12:28 PM

## 2020-08-10 NOTE — Progress Notes (Signed)
Progress note    08/10/20 0740  Psych Admission Type (Psych Patients Only)  Admission Status Voluntary  Psychosocial Assessment  Patient Complaints Anxiety;Hyperactivity  Eye Contact Fair  Facial Expression Animated;Anxious  Affect Anxious;Preoccupied  Speech Pressured;Tangential  Interaction Attention-seeking;Demanding;Hypervigilant  Motor Activity Fidgety;Hyperactive;Restless  Appearance/Hygiene Unremarkable  Behavior Characteristics Cooperative;Appropriate to situation;Anxious;Hyperactive  Mood Anxious;Pleasant  Thought Process  Coherency Disorganized;Flight of ideas;Loose associations;Tangential  Content Compulsions;Obsessions;Preoccupation  Delusions Grandeur  Perception Derealization  Hallucination None reported or observed  Judgment Poor  Confusion Mild  Danger to Self  Current suicidal ideation? Denies  Danger to Others  Danger to Others None reported or observed

## 2020-08-10 NOTE — Tx Team (Signed)
Interdisciplinary Treatment and Diagnostic Plan Update  08/10/2020 Time of Session: 9:30am Nathan Holloway MRN: [id number]  Principal Diagnosis: <principal problem not specified>  Secondary Diagnoses: Active Problems:   Schizoaffective disorder, bipolar type ([place])   Current Medications:  Current Facility-Administered Medications  Medication Dose Route Frequency Provider Last Rate Last Admin  . acetaminophen (TYLENOL) tablet 650 mg  650 mg Oral Q6H PRN Rankin, Shuvon B, NP   650 mg at 08/08/20 2054  . alum & mag hydroxide-simeth (MAALOX/MYLANTA) 200-200-20 MG/5ML suspension 30 mL  30 mL Oral Q4H PRN Rankin, Shuvon B, NP      . amLODipine (NORVASC) tablet 5 mg  5 mg Oral Daily Sharma Covert, MD      . divalproex (DEPAKOTE ER) 24 hr tablet 750 mg  750 mg Oral Q supper Sharma Covert, MD   750 mg at 08/09/20 1724  . OLANZapine zydis (ZYPREXA) disintegrating tablet 10 mg  10 mg Oral Q8H PRN Sharma Covert, MD   10 mg at 08/10/20 1039   And  . LORazepam (ATIVAN) tablet 1 mg  1 mg Oral Q6H PRN Sharma Covert, MD   1 mg at 08/08/20 2053   And  . ziprasidone (GEODON) injection 20 mg  20 mg Intramuscular Q6H PRN Sharma Covert, MD      . magnesium hydroxide (MILK OF MAGNESIA) suspension 30 mL  30 mL Oral Daily PRN Rankin, Shuvon B, NP      . nicotine (NICODERM CQ - dosed in mg/24 hours) patch 14 mg  14 mg Transdermal Daily Sharma Covert, MD   14 mg at 08/10/20 0742  . OLANZapine zydis (ZYPREXA) disintegrating tablet 20 mg  20 mg Oral QHS Sharma Covert, MD   20 mg at 08/09/20 2051  . traZODone (DESYREL) tablet 150 mg  150 mg Oral QHS PRN Rankin, Shuvon B, NP   150 mg at 08/09/20 2052   PTA Medications: Medications Prior to Admission  Medication Sig Dispense Refill Last Dose  . divalproex (DEPAKOTE) 250 MG DR tablet Take 3 tablets (750 mg total) by mouth daily with supper.     . metFORMIN (GLUCOPHAGE) 500 MG tablet Take 1 tablet (500 mg total) by mouth daily with  breakfast. (Patient not taking: No sig reported) 30 tablet 0   . OLANZapine zydis (ZYPREXA) 15 MG disintegrating tablet Take 1 tablet (15 mg total) by mouth at bedtime. (Patient not taking: No sig reported) 30 tablet 2   . traZODone (DESYREL) 100 MG tablet Take 1 tablet (100 mg total) by mouth at bedtime as needed for sleep. (Patient not taking: No sig reported) 30 tablet 0     Patient Stressors: Marital or family conflict  Patient Strengths: Average or above average intelligence Physical Health Supportive family/friends  Treatment Modalities: Medication Management, Group therapy, Case management,  1 to 1 session with clinician, Psychoeducation, Recreational therapy.   Physician Treatment Plan for Primary Diagnosis: <principal problem not specified> Long Term Goal(s): Improvement in symptoms so as ready for discharge Improvement in symptoms so as ready for discharge   Short Term Goals: Ability to identify changes in lifestyle to reduce recurrence of condition will improve Ability to verbalize feelings will improve Ability to demonstrate self-control will improve Ability to identify and develop effective coping behaviors will improve Ability to maintain clinical measurements within normal limits will improve Compliance with prescribed medications will improve Ability to identify triggers associated with substance abuse/mental health issues will improve Ability to identify changes in lifestyle  to reduce recurrence of condition will improve Ability to verbalize feelings will improve Ability to demonstrate self-control will improve Ability to identify and develop effective coping behaviors will improve Ability to maintain clinical measurements within normal limits will improve Compliance with prescribed medications will improve Ability to identify triggers associated with substance abuse/mental health issues will improve  Medication Management: Evaluate patient's response, side effects,  and tolerance of medication regimen.  Therapeutic Interventions: 1 to 1 sessions, Unit Group sessions and Medication administration.  Evaluation of Outcomes: Not Met  Physician Treatment Plan for Secondary Diagnosis: Active Problems:   Schizoaffective disorder, bipolar type ([place])  Long Term Goal(s): Improvement in symptoms so as ready for discharge Improvement in symptoms so as ready for discharge   Short Term Goals: Ability to identify changes in lifestyle to reduce recurrence of condition will improve Ability to verbalize feelings will improve Ability to demonstrate self-control will improve Ability to identify and develop effective coping behaviors will improve Ability to maintain clinical measurements within normal limits will improve Compliance with prescribed medications will improve Ability to identify triggers associated with substance abuse/mental health issues will improve Ability to identify changes in lifestyle to reduce recurrence of condition will improve Ability to verbalize feelings will improve Ability to demonstrate self-control will improve Ability to identify and develop effective coping behaviors will improve Ability to maintain clinical measurements within normal limits will improve Compliance with prescribed medications will improve Ability to identify triggers associated with substance abuse/mental health issues will improve     Medication Management: Evaluate patient's response, side effects, and tolerance of medication regimen.  Therapeutic Interventions: 1 to 1 sessions, Unit Group sessions and Medication administration.  Evaluation of Outcomes: Not Met   RN Treatment Plan for Primary Diagnosis: <principal problem not specified> Long Term Goal(s): Knowledge of disease and therapeutic regimen to maintain health will improve  Short Term Goals: Ability to remain free from injury will improve, Ability to verbalize frustration and anger appropriately will  improve, Ability to identify and develop effective coping behaviors will improve and Compliance with prescribed medications will improve  Medication Management: RN will administer medications as ordered by provider, will assess and evaluate patient's response and provide education to patient for prescribed medication. RN will report any adverse and/or side effects to prescribing provider.  Therapeutic Interventions: 1 on 1 counseling sessions, Psychoeducation, Medication administration, Evaluate responses to treatment, Monitor vital signs and CBGs as ordered, Perform/monitor CIWA, COWS, AIMS and Fall Risk screenings as ordered, Perform wound care treatments as ordered.  Evaluation of Outcomes: Not Met   LCSW Treatment Plan for Primary Diagnosis: <principal problem not specified> Long Term Goal(s): Safe transition to appropriate next level of care at discharge, Engage patient in therapeutic group addressing interpersonal concerns.  Short Term Goals: Engage patient in aftercare planning with referrals and resources, Increase social support, Increase emotional regulation, Identify triggers associated with mental health/substance abuse issues and Increase skills for wellness and recovery  Therapeutic Interventions: Assess for all discharge needs, 1 to 1 time with Social worker, Explore available resources and support systems, Assess for adequacy in community support network, Educate family and significant other(s) on suicide prevention, Complete Psychosocial Assessment, Interpersonal group therapy.  Evaluation of Outcomes: Not Met   Progress in Treatment: Attending groups: Yes. Participating in groups: Yes. Taking medication as prescribed: Yes. Toleration medication: Yes. Family/Significant other contact made: Yes, individual(s) contacted:  with mother Patient understands diagnosis: Yes. Discussing patient identified problems/goals with staff: Yes. Medical problems stabilized or resolved:  Yes.  Denies suicidal/homicidal ideation: Yes. Issues/concerns per patient self-inventory: No.   New problem(s) identified: No, Describe:  none  New Short Term/Long Term Goal(s): medication stabilization, elimination of SI thoughts, development of comprehensive mental wellness plan.   Patient Goals:  "To get out and to find the right medication that does not make me feel like a zombie"  Discharge Plan or Barriers: Patient it to follow up with [facility] for medication management. Pt will be referred to [facility].   Reason for Continuation of Hospitalization: [place] Medication stabilization  Estimated Length of Stay: 3-5 days  Attendees: Patient: Nathan Holloway 08/10/2020   Physician: Lala Lund, MD 08/10/2020   Nursing:  08/10/2020   RN Care Manager: 08/10/2020  Social Worker: Darletta Moll, LCSW 08/10/2020   Recreational Therapist:  08/10/2020   Other:  08/10/2020   Other:  08/10/2020   Other: 08/10/2020       Scribe for Treatment Team: Vassie Moselle, LCSW 08/10/2020 12:12 PM

## 2020-08-10 NOTE — Plan of Care (Signed)
  Problem: Education: Goal: Knowledge of [facility] Education information/materials will improve Outcome: Progressing Goal: Emotional status will improve Outcome: Progressing Goal: Mental status will improve Outcome: Progressing Goal: Verbalization of understanding the information provided will improve Outcome: Progressing   

## 2020-08-10 NOTE — Progress Notes (Signed)
Adult Psychoeducational Group Note  Date:  08/10/2020 Time:  10:31 PM  Group Topic/Focus:  Wrap-Up Group:   The focus of this group is to help patients review their daily goal of treatment and discuss progress on daily workbooks.  Participation Level:  Active  Participation Quality:  Appropriate  Affect:  Anxious  Cognitive:  Disorganized  Insight: Limited  Engagement in Group:  Limited  Modes of Intervention:  Discussion  Additional Comments:  Pt stated his goal for today was to focus on his treatment plan. Pt stated he felt he accomplished his goal today. Pt stated he talk with his doctor and social worker, regarding his care today. Pt stated he took all his medication today from his providers. Pt rated his overall day a 10. Pt stated his appetite was pretty good today and he attended all meals. Pt stated his sleep last night was good. Pt stated the goal for tonight was to get some rest. Pt stated he was in no physical pain. Pt deny auditory or visual hallucinations. Pt denies thoughts of harming himself or others. Pt stated he would alert staff if anything changes.  Felipa Furnace 08/10/2020, 10:31 PM

## 2020-08-10 NOTE — Progress Notes (Signed)
Recreation Therapy Notes  Date: 12.20.21 Time: 1000 Location: 500 Hall Dayroom  Group Topic: Communication  Goal Area(s) Addresses:  Patient will effectively communicate with peers in group.  Patient will verbalize benefit of healthy communication. Patient will verbalize positive effect of healthy communication on post d/c goals.  Patient will identify communication techniques that made activity effective for group.   Behavioral Response: Engaged  Intervention: Merchandiser, retail, pencils, blank paper  Activity: Geometrical Drawings.  One patient will describe a picture to peers during group.  The presenter could be as descriptive as possible (top, bottom, left, right, big, small, shade it in, don't shade it in, etc) in describing the picture.  The remaining peers could only as the presenter to repeat themselves, they could not ask any detailed questions.  Once the presenter finished, they would show the picture to the rest of the group and compare their drawings to the picture to see how close they were to the original.  Education: Communication, Discharge Planning  Education Outcome: Acknowledges understanding/In group clarification offered/Needs additional education.   Clinical Observations/Feedback: Pt described the first picture and seemed to over think what he wanted say.  Pt expressed the tone in which you speak to people can cause problems depending on how it's perceived.  Pt also stated there could be "repercussions" if someone takes what you say the wrong way.  Pt went on to say you could ask the person you're talking to to repeat what you said to make sure they understood you.       Caroll Rancher, LRT/CTRS     Caroll Rancher A 08/10/2020 11:26 AM

## 2020-08-10 NOTE — [facility] Group Notes (Signed)
LCSW Group Therapy Notes  Type of Therapy and Topic: Group Therapy: Healthy Vs. Unhealthy Coping Strategies  Date and Time: 08/10/20 1:00PM  Participation Level: [facility] PARTICIPATION LEVEL: Active  Description of Group:  In this group, patients will be encouraged to explore their healthy and unhealthy coping strategics. Coping strategies are actions that we take to deal with stress, problems, or uncomfortable emotions in our daily lives. Each patient will be challenged to read some scenarios and discuss the unhealthy and healthy coping strategies within those scenarios. Also, each patient will be challenged to describe current healthy and unhealthy strategies that they use in their own lives and discuss the outcomes and barriers to those strategies. This group will be process-oriented, with patients participating in exploration of their own experiences as well as giving and receiving support and challenge from other group members.  Therapeutic Goals: 1. Patient will identify personal healthy and unhealthy coping strategies. 2. Patient will identify healthy and unhealthy coping strategies, in others, through scenarios.  3. Patient will identify expected outcomes of healthy and unhealthy coping strategies. 4. Patient will identify barriers to using healthy coping strategies.   Summary of Patient Progress: Patient attended group, however came to group late and did not share coping skills he uses. Patient interacted with peers and played basketball during recreation time.   Therapeutic Modalities:  Cognitive Behavioral Therapy Solution Focused Therapy Motivational Interviewing    Ruthann Cancer MSW, LCSW Clincal Social Worker  [facility]

## 2020-08-10 NOTE — B[facility] Counselor (Signed)
CSW spoke with patients mother/legal guardian, Nathan Holloway [phone]) who stated that this patient is able to return home at discharge.    Ruthann Cancer MSW, LCSW Clincal Social Worker  [facility]

## 2020-08-10 NOTE — [facility] Group Notes (Signed)
Adult Psychoeducational Group Note  Date:  08/10/2020 Time:  10:09 AM  Group Topic/Focus:  Goals Group:   The focus of this group is to help patients establish daily goals to achieve during treatment and discuss how the patient can incorporate goal setting into their daily lives to aide in recovery.  Participation Level:  Active  Participation Quality:  Appropriate  Affect:  Appropriate  Cognitive:  Appropriate  Insight: Appropriate  Engagement in Group:  Engaged  Modes of Intervention:  Discussion  Additional Comments:  Pt had a goal of being themself, following directions and making his stay here enjoyable.  Nathan Holloway Deroy Noah 08/10/2020, 10:09 AM

## 2020-08-10 NOTE — Progress Notes (Signed)
After writer told another patient in the dayroom that her hands were dry and she needed lotion , he decided to verbally attack Clinical research associate. Pt was calling Chemical engineer" and continued to verbally attack Heritage manager was negative and not positive toward patients"I'm a Theme park manager and I can have your job"

## 2020-08-11 DIAGNOSIS — Z720 Tobacco use: Secondary | ICD-10-CM | POA: Diagnosis present

## 2020-08-11 DIAGNOSIS — I10 Essential (primary) hypertension: Secondary | ICD-10-CM | POA: Clinically undetermined

## 2020-08-11 DIAGNOSIS — Z79899 Other long term (current) drug therapy: Secondary | ICD-10-CM

## 2020-08-11 NOTE — Progress Notes (Signed)
Progress note    08/11/20 0722  Psych Admission Type (Psych Patients Only)  Admission Status Voluntary  Psychosocial Assessment  Patient Complaints Anxiety;Restlessness  Eye Contact Fair  Facial Expression Animated;Anxious  Affect Anxious;Preoccupied  Speech Pressured;Tangential;Rhyming  Interaction Assertive;Attention-seeking;Demanding;Dominating;Hypervigilant  Motor Activity Fidgety;Hyperactive;Restless  Appearance/Hygiene Meticulous  Behavior Characteristics Cooperative;Appropriate to situation;Anxious;Hyperactive;Restless  Mood Anxious;Pleasant  Thought Process  Coherency Disorganized;Flight of ideas;Tangential  Content Hypochondria;Preoccupation  Delusions Grandeur  Perception Derealization  Hallucination None reported or observed  Judgment Poor  Confusion Mild  Danger to Self  Current suicidal ideation? Denies  Danger to Others  Danger to Others None reported or observed

## 2020-08-11 NOTE — B[facility] Counselor (Signed)
CSW spoke with patients mother about patients mental status. Per mother patient has improved some, but states he has still been confused at times when talking to him on the phone. Per mother her and pt's grandmother will be formulating a treatment plan for when he returns home. She will no longer allow this patient to vape in the house. She stated she planned to do a "sweep" of his room to look for any drug paraphenelia or other items that may be harmful. Patients mother believes that this patient could benefit from a few more days to see further progress in this patient.   Ruthann Cancer MSW, LCSW Clincal Social Worker  [facility]

## 2020-08-11 NOTE — Plan of Care (Signed)

## 2020-08-11 NOTE — Progress Notes (Signed)
Adult Psychoeducational Group Note  Date:  08/11/2020 Time:  11:13 PM  Group Topic/Focus:  Wrap-Up Group:   The focus of this group is to help patients review their daily goal of treatment and discuss progress on daily workbooks.  Participation Level:  Did Not Attend  Participation Quality:  Did Not Attend  Affect:  Did Not Attend  Cognitive:  Did Not Attend  Insight: None  Engagement in Group:  Did Not Attend  Modes of Intervention:  Did Not Attend  Additional Comments:  Pt did not attend evening wrap up group tonight.  Felipa Furnace 08/11/2020, 11:13 PM

## 2020-08-11 NOTE — Progress Notes (Addendum)
[facility] MD Progress Note  08/11/2020 2:36 PM Nathan Holloway  MRN:  [id number]   Subjective:  Nathan Holloway is a 24 y.o. male with a history of schizoaffective d/o bipolar type by hx vs bipolar I with psychotic features, who was initially admitted for inpatient psychiatric hospitalization on 08/07/2020 for management of  manic symptoms, agitation, and delusions in the context of medication noncompliance. The patient is currently on Hospital Day 4.   Chart Review: The patient's chart was reviewed and nursing notes were reviewed. The patient's case was discussed in multidisciplinary team meeting. According to nursing notes, he was verbally aggressive with staff last night and was making delusional statements about being a [employer] manager. His MAR shows that he received Ativan 1mg  last night for agitation and Zyprexa 10mg  yesterday morning for agitation.   Information Obtained Today During Patient Interview: The patient was seen and evaluated in his room. On assessment today the patient reports "I am feeling amazing." He is fixated on desire for discharge before Christmas and repeatedly questions about his discharge plan. He states he is having no current side-effects with his medication but admits he is fearful of potential weight gain on his present medication regimen. He denies current AVH, ideas of reference, or first rank symptoms. He denies SI or HI. He reports stable sleep and appetite and voices no physical complaints on exam. At conclusion of interview the patient asks if examiner is married and if examiner "cooks and cleans for your husband." He derails into discussion about shared gender roles in relationships.   Principal Problem: Schizoaffective disorder, bipolar type (HCC) Diagnosis: Principal Problem:   Schizoaffective disorder, bipolar type (HCC)  Total Time Spent in Direct Patient Care:  I personally spent 30 minutes on the unit in direct patient care. The direct patient care time  included face-to-face time with the patient, reviewing the patient's chart, communicating with other professionals, and coordinating care. Greater than 50% of the total encounter time was spent on counseling and coordination of care related to his discharge planning, medication education, and goals of treatment.  Past Psychiatric History: (per Admission H&P) This is the patient's fifth or 6 psychiatric admission.  He had been previously diagnosed with bipolar disorder, schizoaffective disorder as well as schizophrenia in the past.  He admitted to continued marijuana use, as well as tobacco use.  Past Medical History:  Past Medical History:  Diagnosis Date  . Manic behavior (HCC)   . Psychotic affective disorder (HCC) 08/30/2015   History reviewed. No pertinent surgical history.   Family History: History reviewed. No pertinent family history.   Family Psychiatric  History: (Per Admission H&P) He reported a family member with anxiety, but no one with bipolar disorder or any thought disorders.  Social History:  Social History   Substance and Sexual Activity  Alcohol Use No     Social History   Substance and Sexual Activity  Drug Use Not Currently  . Types: Marijuana   Comment: recent usage while out of town this week    Sleep: Good  Per patient report - slept 6.5 hours per nursing  Appetite:  Good per patient report  Current Medications: Current Facility-Administered Medications  Medication Dose Route Frequency Provider Last Rate Last Admin  . acetaminophen (TYLENOL) tablet 650 mg  650 mg Oral Q6H PRN Rankin, Shuvon B, NP   650 mg at 08/08/20 2054  . alum & mag hydroxide-simeth (MAALOX/MYLANTA) 200-200-20 MG/5ML suspension 30 mL  30 mL Oral Q4H PRN Rankin, Shuvon B,  NP      . amLODipine (NORVASC) tablet 5 mg  5 mg Oral Daily Antonieta Pert, MD   5 mg at 08/11/20 1962  . divalproex (DEPAKOTE ER) 24 hr tablet 750 mg  750 mg Oral Q supper Antonieta Pert, MD   750 mg at  08/10/20 1618  . OLANZapine zydis (ZYPREXA) disintegrating tablet 10 mg  10 mg Oral Q8H PRN Antonieta Pert, MD   10 mg at 08/10/20 1039   And  . LORazepam (ATIVAN) tablet 1 mg  1 mg Oral Q6H PRN Antonieta Pert, MD   1 mg at 08/10/20 2047   And  . ziprasidone (GEODON) injection 20 mg  20 mg Intramuscular Q6H PRN Antonieta Pert, MD      . magnesium hydroxide (MILK OF MAGNESIA) suspension 30 mL  30 mL Oral Daily PRN Rankin, Shuvon B, NP      . nicotine (NICODERM CQ - dosed in mg/24 hours) patch 14 mg  14 mg Transdermal Daily Antonieta Pert, MD   14 mg at 08/11/20 2297  . OLANZapine zydis (ZYPREXA) disintegrating tablet 20 mg  20 mg Oral QHS Antonieta Pert, MD   20 mg at 08/10/20 2046  . traZODone (DESYREL) tablet 150 mg  150 mg Oral QHS PRN Rankin, Shuvon B, NP   150 mg at 08/10/20 2047   Lab Results: No results found for this or any previous visit (from the past 48 hour(s)).  Blood Alcohol level:  Lab Results  Component Value Date   ETH <10 08/06/2020   ETH <10 07/04/2020   Metabolic Disorder Labs: Lab Results  Component Value Date   HGBA1C 4.6 (L) 08/06/2020   MPG 85.32 08/06/2020   MPG 82.45 07/04/2020   Lab Results  Component Value Date   PROLACTIN 3.7 (L) 07/04/2020   Lab Results  Component Value Date   CHOL 193 07/04/2020   TRIG 62 07/04/2020   HDL 76 07/04/2020   CHOLHDL 2.5 07/04/2020   VLDL 12 07/04/2020   LDLCALC 105 (H) 07/04/2020   Physical Findings: AIMS on 08/11/20: Facial and Oral Movements Muscles of Facial Expression: None, normal Lips and Perioral Area: None, normal Jaw: None, normal Tongue: None, normal,Extremity Movements Upper (arms, wrists, hands, fingers): None, normal Lower (legs, knees, ankles, toes): None, normal, Trunk Movements Neck, shoulders, hips: None, normal, Overall Severity Severity of abnormal movements (highest score from questions above): None, normal Incapacitation due to abnormal movements: None,  normal Patient's awareness of abnormal movements (rate only patient's report): No Awareness, Dental Status Current problems with teeth and/or dentures?: No Does patient usually wear dentures?: No   Musculoskeletal: Strength & Muscle Tone: within normal limits Gait & Station: normal  - steady Patient leans: N/A  Psychiatric Specialty Exam: Physical Exam HENT:     Head: Normocephalic.  Pulmonary:     Effort: Pulmonary effort is normal.  Neurological:     Mental Status: He is alert.     Review of Systems  Respiratory: Negative for shortness of breath.   Cardiovascular: Negative for chest pain.  Gastrointestinal: Negative for abdominal pain.  Neurological: Negative for headaches.    Blood pressure 137/80, pulse 74, temperature (!) 97.5 F (36.4 C), temperature source Oral, resp. rate 18, height 5\' 7"  (1.702 m), weight 72.1 kg, SpO2 98 %.Body mass index is 24.9 kg/m.  General Appearance: Fair hygiene, appears stated age, well engaged with examiner  Eye Contact:  Good  Speech:  Mildly rapid but not pressured  Volume:  Normal  Mood:  Described as "amazing" - appears mildly elevated  Affect:  Congruent  Thought Process:  Tangential and ruminative about discharge planning, derails at conclusion of interview  Orientation:  Full (Time, Place, and Person)  Thought Content:  Denies SI or HI; does not appear to have evidence of obsessions or compulsions; denies AVH, ideas of reference, or first rank symptoms and does not make delusional statements on exam - per nursing was making delusional statements last night; denies paranoia  Suicidal Thoughts:  No  Homicidal Thoughts:  No  Memory:  Grossly intact to recent recall; remote recall untested  Judgement:  Fair  Insight:  Lacking  Psychomotor Activity:  somewhat fidgety on exam; walking in halls prior to assessment  Concentration:  Concentration: Fair  Recall:  [employer]  Fund of Knowledge:  Fair  Language:  Good  Akathisia:  No  Handed:   Right  AIMS (if indicated):   0  Assets:  Communication Skills Desire for Improvement Resilience Social Support  ADL's:  Intact  Cognition:  WNL  Sleep:  Number of Hours: 6.5   Treatment Plan Summary:  ASSESSMENT: Nathan Holloway is a 24 y.o. male with a history of schizoaffective d/o bipolar type by hx who was initially admitted for inpatient psychiatric hospitalization on 08/07/2020 for management of manic symptoms, agitation, and delusions in the context of medication noncompliance.   Diagnoses / Active Problems: Schizoaffective d/o bipolar type by hx (r/o bipolar I MRE manic severe with psychotic features) HTN High Risk Medication Use   PLAN: 1. Safety and Monitoring:  -- Voluntary admission to inpatient psychiatric unit for safety, stabilization and treatment  -- Daily contact with patient to assess and evaluate symptoms and progress in treatment  -- Patient's case to be discussed in multi-disciplinary team meeting  -- Observation Level : q15 minute checks  -- Vital signs:  q12 hours  -- Precautions: suicide,  2. Psychiatric Diagnoses and Treatment:   Schizoaffective d/o bipolar type by hx (r/o bipolar I MRE manic severe with psychotic features) -- The risks/benefits/side-effects/alternatives to this medications were discussed in detail with the patient and time was given for questions. Specifically the risks of developing TD/EPS, weight gain, DM and high cholesterol were discussed in the context of Zyprexa use and the risks of developing liver toxicity, blood dyscrasias, and weight gain with use of VPA were reviewed. The patient verbalized understanding of discussed risks and consented to continued medication use. -- Continue VPA ER 750mg  in the evening - will check VPA level, CBC, and AST/ALT for monitoring tomorrow - may need medication titration over coming days  -- Continue Zyprexa 20mg  qhs  -- Continue PRN zyprexa/ativan/IM Geodon for agitation protocol  -- Metabolic  profile and EKG monitoring obtained while on an atypical antipsychotic (BMI: 24.90 Lipid Panel: pending; HbgA1c:4.6; QTc:413)   -- Encouraged patient to participate in unit milieu and in scheduled group therapies   -- Short Term Goals: Ability to identify changes in lifestyle to reduce recurrence of condition will improve, Ability to demonstrate self-control will improve, Compliance with prescribed medications will improve and Ability to identify triggers associated with substance abuse/mental health issues will improve  -- Long Term Goals: Improvement in symptoms so as ready for discharge   High Risk Medication Use: -- The complexity of this patient's case involves drug therapy with VPA ER which requires intensive monitoring for toxicity. This is in part due to a narrow therapeutic window as well  as potential for toxicity with  this medication.     3. Medical Issues Being Addressed:   Tobacco Use Disorder  -- Nicotine patch 14mg /24 hours ordered  -- Smoking cessation encouraged   HTN  -- Continue Norvasc 5mg  qd (Bps 107-137 systolic and 80 diastolic today)   Insomnia  -- Continue Trazodone 150mg  po qhs PRN insomnia   Admission Creatinine 1.43  -- Encouraging po hydration and rechecking CMP tomorrow  4. Discharge Planning:   -- Social work and case management to assist with discharge planning and identification of hospital follow-up needs prior to discharge  -- Estimated LOS: 5-7 days  -- Discharge Concerns: Need to establish a safety plan; Medication compliance and effectiveness  -- Discharge Goals: Return home with outpatient referrals for mental health follow-up including medication management/psychotherapy   02-08-1984, MD, FAPA 08/11/2020, 2:36 PM

## 2020-08-11 NOTE — Progress Notes (Signed)
The focus of this group is to help patients establish daily goals to achieve during treatment and discuss how the patient can incorporate goal setting into their daily lives to aide in recovery.Patient attended group. 

## 2020-08-11 NOTE — Progress Notes (Signed)
   08/11/20 2100  Psych Admission Type (Psych Patients Only)  Admission Status Voluntary  Psychosocial Assessment  Patient Complaints Anxiety;Suspiciousness;Restlessness  Eye Contact Fair  Facial Expression Animated;Anxious  Affect Anxious;Preoccupied  Speech Pressured;Tangential;Rhyming  Interaction Assertive;Attention-seeking;Demanding;Dominating;Hypervigilant  Motor Activity Fidgety;Hyperactive;Restless  Appearance/Hygiene Meticulous  Behavior Characteristics Cooperative  Mood Anxious;Pleasant  Thought Process  Coherency Disorganized;Flight of ideas;Tangential  Content Hypochondria;Preoccupation  Delusions Grandeur  Perception Derealization  Hallucination None reported or observed  Judgment Poor  Confusion Mild  Danger to Self  Current suicidal ideation? Denies  Danger to Others  Danger to Others None reported or observed

## 2020-08-12 LAB — CBC WITH DIFFERENTIAL/PLATELET
Abs Immature Granulocytes: 0.02 10*3/uL (ref 0.00–0.07)
Basophils Absolute: 0 10*3/uL (ref 0.0–0.1)
Basophils Relative: 1 %
Eosinophils Absolute: 0.1 10*3/uL (ref 0.0–0.5)
Eosinophils Relative: 1 %
HCT: 44.2 % (ref 39.0–52.0)
Hemoglobin: 14.4 g/dL (ref 13.0–17.0)
Immature Granulocytes: 0 %
Lymphocytes Relative: 38 %
Lymphs Abs: 2.7 10*3/uL (ref 0.7–4.0)
MCH: 29 pg (ref 26.0–34.0)
MCHC: 32.6 g/dL (ref 30.0–36.0)
MCV: 89.1 fL (ref 80.0–100.0)
Monocytes Absolute: 0.6 10*3/uL (ref 0.1–1.0)
Monocytes Relative: 8 %
Neutro Abs: 3.9 10*3/uL (ref 1.7–7.7)
Neutrophils Relative %: 52 %
Platelets: 195 10*3/uL (ref 150–400)
RBC: 4.96 MIL/uL (ref 4.22–5.81)
RDW: 12.5 % (ref 11.5–15.5)
WBC: 7.3 10*3/uL (ref 4.0–10.5)
nRBC: 0 % (ref 0.0–0.2)

## 2020-08-12 LAB — COMPREHENSIVE METABOLIC PANEL
ALT: 13 U/L (ref 0–44)
AST: 15 U/L (ref 15–41)
Albumin: 4.1 g/dL (ref 3.5–5.0)
Alkaline Phosphatase: 67 U/L (ref 38–126)
Anion gap: 10 (ref 5–15)
BUN: 13 mg/dL (ref 6–20)
CO2: 27 mmol/L (ref 22–32)
Calcium: 9.3 mg/dL (ref 8.9–10.3)
Chloride: 101 mmol/L (ref 98–111)
Creatinine, Ser: 1.21 mg/dL (ref 0.61–1.24)
GFR, Estimated: >60 mL/min (ref >60)
Glucose, Bld: 89 mg/dL (ref 70–99)
Potassium: 4.1 mmol/L (ref 3.5–5.1)
Sodium: 138 mmol/L (ref 135–145)
Total Bilirubin: 0.4 mg/dL (ref 0.3–1.2)
Total Protein: 7 g/dL (ref 6.5–8.1)

## 2020-08-12 LAB — LIPID PANEL
Cholesterol: 189 mg/dL (ref 0–200)
HDL: 62 mg/dL (ref >40)
LDL Cholesterol: 106 mg/dL — ABNORMAL HIGH (ref 0–99)
Total CHOL/HDL Ratio: 3 RATIO
Triglycerides: 107 mg/dL (ref <150)
VLDL: 21 mg/dL (ref 0–40)

## 2020-08-12 LAB — VALPROIC ACID LEVEL: Valproic Acid Lvl: 35 ug/mL — ABNORMAL LOW (ref 50.0–100.0)

## 2020-08-12 MED ORDER — DIVALPROEX SODIUM ER 500 MG PO TB24
1000.0000 mg | ORAL_TABLET | Freq: Every day | ORAL | Status: DC
  Administered 2020-08-12 – 2020-08-14 (×3): 1000 mg via ORAL
  Filled 2020-08-12: qty 6
  Filled 2020-08-12 (×4): qty 2

## 2020-08-12 NOTE — Progress Notes (Signed)
[facility] MD Progress Note  08/12/2020 8:10 AM Nathan Holloway  MRN:  [id number]   Subjective:  Nathan Holloway is a 24 y.o. male with a history of bipolar I vs schizoaffective d/o bipolar type, who was initially admitted for inpatient psychiatric hospitalization on 08/07/2020 for management of manic symptoms, agitation, and delusions in the context of medication noncompliance. The patient is currently on Hospital Day 5.   Chart Review: The patient's chart was reviewed and nursing notes were reviewed. The patient's case was discussed in multidisciplinary team meeting. Per report from social work, the patient's mother does not feel he is back to clinical baseline when she has spoken with him. Per MAR the patient required po Ativan 1mg  at 2053 last night and again at 0324 for agitation. He is taking Trazodone PRN for sleep. He has been compliant with scheduled medications. Per nursing notes, he has remained attention seeking, tangential and disorganized, pressured, and animated.   Information Obtained Today During Patient Interview: The patient was seen and evaluated in his room. On assessment today the patient reports frustration that he is still in the hospital. He is preoccupied with trying to get home for the holidays. He does not feel he is presently manic and states "my thoughts are no longer racing." When confronted with report from nursing that yesterday he was animated and agitated requiring PRN medications, he minimizes these symptoms. He states "I feel real when I am off my medications," and he expresses concern again that his medication regimen will lead to potential weight gain. We discussed potential dose titration up to a more therapeutic dose of VPA ER and he agreed to medication increase. He denies SI, HI, AVH, ideas of reference, or first rank symptoms. He denies paranoia. When questioned about report of grandiose delusions, he denies this. He reports "erratic sleep patterns" but reports stable  appetite. He voices no physical complaints today.   Principal Problem: Schizoaffective disorder, bipolar type (HCC) Diagnosis: Principal Problem:   Schizoaffective disorder, bipolar type (HCC) Active Problems:   Hypertension   High risk medication use   Nicotine abuse  Total Time Spent in Direct Patient Care:  I personally spent 25 minutes on the unit in direct patient care. The direct patient care time included face-to-face time with the patient, reviewing the patient's chart, communicating with other professionals, and coordinating care. Greater than 50% of the total encounter time was spent on counseling and coordination of care related to his discharge planning, medication education, and goals of treatment.  Past Psychiatric History: (per Admission H&P) This is the patient's fifth or 6 psychiatric admission.  He had been previously diagnosed with bipolar disorder, schizoaffective disorder as well as schizophrenia in the past.  He admitted to continued marijuana use, as well as tobacco use.  Past Medical History:  Past Medical History:  Diagnosis Date  . Manic behavior (HCC)   . Psychotic affective disorder (HCC) 08/30/2015   History reviewed. No pertinent surgical history.   Family History: History reviewed. No pertinent family history.   Family Psychiatric  History: (Per Admission H&P) He reported a family member with anxiety, but no one with bipolar disorder or any thought disorders.  Social History:  Social History   Substance and Sexual Activity  Alcohol Use No     Social History   Substance and Sexual Activity  Drug Use Not Currently  . Types: Marijuana   Comment: recent usage while out of town this week    Sleep: "erratic" Per patient report -  slept 6 hours per nursing  Appetite:  Good per patient report  Current Medications: Current Facility-Administered Medications  Medication Dose Route Frequency Provider Last Rate Last Admin  . acetaminophen (TYLENOL)  tablet 650 mg  650 mg Oral Q6H PRN Rankin, Shuvon B, NP   650 mg at 08/08/20 2054  . alum & mag hydroxide-simeth (MAALOX/MYLANTA) 200-200-20 MG/5ML suspension 30 mL  30 mL Oral Q4H PRN Rankin, Shuvon B, NP      . amLODipine (NORVASC) tablet 5 mg  5 mg Oral Daily Antonieta Pert, MD   5 mg at 08/11/20 1610  . divalproex (DEPAKOTE ER) 24 hr tablet 1,000 mg  1,000 mg Oral Q supper Mason Jim, Richell Corker E, MD      . OLANZapine zydis (ZYPREXA) disintegrating tablet 10 mg  10 mg Oral Q8H PRN Antonieta Pert, MD   10 mg at 08/10/20 1039   And  . LORazepam (ATIVAN) tablet 1 mg  1 mg Oral Q6H PRN Antonieta Pert, MD   1 mg at 08/12/20 9604   And  . ziprasidone (GEODON) injection 20 mg  20 mg Intramuscular Q6H PRN Antonieta Pert, MD      . magnesium hydroxide (MILK OF MAGNESIA) suspension 30 mL  30 mL Oral Daily PRN Rankin, Shuvon B, NP      . nicotine (NICODERM CQ - dosed in mg/24 hours) patch 14 mg  14 mg Transdermal Daily Antonieta Pert, MD   14 mg at 08/11/20 5409  . OLANZapine zydis (ZYPREXA) disintegrating tablet 20 mg  20 mg Oral QHS Antonieta Pert, MD   20 mg at 08/11/20 2051  . traZODone (DESYREL) tablet 150 mg  150 mg Oral QHS PRN Rankin, Shuvon B, NP   150 mg at 08/11/20 2051   Lab Results:  Results for orders placed or performed during the hospital encounter of 08/07/20 (from the past 48 hour(s))  Valproic acid level     Status: Abnormal   Collection Time: 08/12/20  6:57 AM  Result Value Ref Range   Valproic Acid Lvl 35 (L) 50.0 - 100.0 ug/mL    Comment: Performed at [facility], [address], [place], [place] [address]  Comprehensive metabolic panel     Status: None   Collection Time: 08/12/20  6:57 AM  Result Value Ref Range   Sodium 138 135 - 145 mmol/L   Potassium 4.1 3.5 - 5.1 mmol/L   Chloride 101 98 - 111 mmol/L   CO2 27 22 - 32 mmol/L   Glucose, Bld 89 70 - 99 mg/dL    Comment: Glucose reference range applies only to samples taken after  fasting for at least 8 hours.   BUN 13 6 - 20 mg/dL   Creatinine, Ser 4.78 0.61 - 1.24 mg/dL   Calcium 9.3 8.9 - 29.5 mg/dL   Total Protein 7.0 6.5 - 8.1 g/dL   Albumin 4.1 3.5 - 5.0 g/dL   AST 15 15 - 41 U/L   ALT 13 0 - 44 U/L   Alkaline Phosphatase 67 38 - 126 U/L   Total Bilirubin 0.4 0.3 - 1.2 mg/dL   GFR, Estimated >62 >13 mL/min    Comment: (NOTE) Calculated using the CKD-EPI Creatinine Equation (2021)    Anion gap 10 5 - 15    Comment: Performed at [facility], [address], [place], [place] [address]  Lipid panel     Status: Abnormal   Collection Time: 08/12/20  6:57 AM  Result Value Ref Range   Cholesterol 189 0 - 200 mg/dL   Triglycerides 350 <093 mg/dL   HDL 62 >81 mg/dL   Total CHOL/HDL Ratio 3.0 RATIO   VLDL 21 0 - 40 mg/dL   LDL Cholesterol 829 (H) 0 - 99 mg/dL    Comment:        Total Cholesterol/HDL:CHD Risk Coronary Heart Disease Risk Table                     Men   Women  1/2 Average Risk   3.4   3.3  Average Risk       5.0   4.4  2 X Average Risk   9.6   7.1  3 X Average Risk  23.4   11.0        Use the calculated Patient Ratio above and the CHD Risk Table to determine the patient's CHD Risk.        ATP III CLASSIFICATION (LDL):  <100     mg/dL   Optimal  937-169  mg/dL   Near or Above                    Optimal  130-159  mg/dL   Borderline  678-938  mg/dL   High  >101     mg/dL   Very High Performed at [facility], [address], [place], [place] [address]   CBC with Differential/Platelet     Status: None   Collection Time: 08/12/20  6:57 AM  Result Value Ref Range   WBC 7.3 4.0 - 10.5 K/uL   RBC 4.96 4.22 - 5.81 MIL/uL   Hemoglobin 14.4 13.0 - 17.0 g/dL   HCT 58.5 27.7 - 82.4 %   MCV 89.1 80.0 - 100.0 fL   MCH 29.0 26.0 - 34.0 pg   MCHC 32.6 30.0 - 36.0 g/dL   RDW 23.5 36.1 - 44.3 %   Platelets 195 150 - 400 K/uL   nRBC 0.0 0.0 - 0.2 %   Neutrophils Relative % 52 %   Neutro Abs 3.9 1.7 - 7.7  K/uL   Lymphocytes Relative 38 %   Lymphs Abs 2.7 0.7 - 4.0 K/uL   Monocytes Relative 8 %   Monocytes Absolute 0.6 0.1 - 1.0 K/uL   Eosinophils Relative 1 %   Eosinophils Absolute 0.1 0.0 - 0.5 K/uL   Basophils Relative 1 %   Basophils Absolute 0.0 0.0 - 0.1 K/uL   Immature Granulocytes 0 %   Abs Immature Granulocytes 0.02 0.00 - 0.07 K/uL    Comment: Performed at [facility], [address], [place], [place] [address]    Blood Alcohol level:  Lab Results  Component Value Date   [facility] <10 08/06/2020   ETH <10 07/04/2020   Metabolic Disorder Labs: Lab Results  Component Value Date   HGBA1C 4.6 (L) 08/06/2020   MPG 85.32 08/06/2020   MPG 82.45 07/04/2020   Lab Results  Component Value Date   PROLACTIN 3.7 (L) 07/04/2020   Lab Results  Component Value Date   CHOL 189 08/12/2020   TRIG 107 08/12/2020   HDL 62 08/12/2020   CHOLHDL 3.0 08/12/2020   VLDL 21 08/12/2020   LDLCALC 106 (H) 08/12/2020   LDLCALC 105 (H) 07/04/2020   Physical Findings:  Musculoskeletal: Strength & Muscle Tone: within normal limits Gait & Station: normal  - steady Patient leans: N/A  Psychiatric Specialty Exam: Physical Exam  HENT:     Head: Normocephalic.  Pulmonary:     Effort: Pulmonary effort is normal.  Neurological:     Mental Status: He is alert.     ROS: denies CP, SOB, HA, N/V  Blood pressure (!) 124/97, pulse 81, temperature (!) 97.5 F (36.4 C), temperature source Oral, resp. rate 18, height 5\' 7"  (1.702 m), weight 72.1 kg, SpO2 98 %.Body mass index is 24.9 kg/m.  General Appearance: Improved hygiene - wearing casual clothes, appears stated age  Eye Contact:  Fair  Speech: mildly rapid but not pressured  Volume:  Normal  Mood: irritable  Affect:  Guarded, irritable, defensive  Thought Process:  Tangential and ruminative about discharge planning  Orientation:  Full (Time, Place, and Person)  Thought Content:  Denies SI or HI; does not appear to have  evidence of obsessions or compulsions; denies AVH, ideas of reference, or first rank symptoms; denies paranoia but appears guarded on exam; no delusions elicited on exam  Suicidal Thoughts:  No  Homicidal Thoughts:  No  Memory:  Grossly intact to recent recall; remote recall untested  Judgement:  Fair  Insight:  Lacking  Psychomotor Activity: less restless - no akathisias  Concentration:  Concentration: Fair; Attention: Fair  Recall:  of Knowledge:  Fair  Language:  Good  Akathisia:  No  Handed:  Right  Assets:  Communication Skills Desire for Improvement Resilience Social Support  ADL's:  Intact  Cognition:  WNL  Sleep:  Number of Hours: 6   Treatment Plan Summary:  ASSESSMENT: Larin Weissberg is a 24 y.o. male with a history of schizoaffective d/o bipolar type vs bipolar I with psychotic features by hx who was initially admitted for inpatient psychiatric hospitalization on 08/07/2020 for management of manic symptoms, agitation, and delusions in the context of medication noncompliance.   Diagnoses / Active Problems: Schizoaffective d/o bipolar type by hx (r/o bipolar I MRE manic severe with psychotic features) HTN High Risk Medication Use   PLAN: 1. Safety and Monitoring:  -- Voluntary admission to inpatient psychiatric unit for safety, stabilization and treatment  -- Daily contact with patient to assess and evaluate symptoms and progress in treatment  -- Patient's case to be discussed in multi-disciplinary team meeting  -- Observation Level : q15 minute checks  -- Vital signs:  q12 hours  -- Precautions: suicide  2. Psychiatric Diagnoses and Treatment:   Schizoaffective d/o bipolar type by hx (r/o bipolar I MRE manic severe with psychotic features) -- Increase VPA ER to 1000mg  in the evening for residual manic symptoms (On 08/12/20: VPA level 35, WBC 7.3, H/H 14.4/44.2, platelets 195, AST 15, ALT 13) - may need continued dose titration over coming days - dose  increase discussed with patient - will continue to monitor VPA level with dose titration -- Continue Zyprexa 20mg  qhs - may need dose titration over coming days -- Continue PRN Zyprexa/ativan/IM Geodon for agitation protocol  -- Metabolic profile and EKG monitoring obtained while on an atypical antipsychotic (BMI: 24.90 Lipid Panel: total cholesterol 189, HDL 62, LDL 106, triglycerides 107 ; HbgA1c:4.6; QTc:413)   -- Encouraged patient to participate in unit milieu and in scheduled group therapies   -- Social work to coordinate with family to see when he is nearing clinical baseline  -- Short Term Goals: Ability to identify changes in lifestyle to reduce recurrence of condition will improve, Ability to demonstrate self-control will improve, Compliance with prescribed medications will improve and Ability to identify triggers  associated with substance abuse/mental health issues will improve  -- Long Term Goals: Improvement in symptoms so as ready for discharge   High Risk Medication Use: -- The complexity of this patient's case involves drug therapy with VPA ER which requires intensive monitoring for toxicity. This is in part due to a narrow therapeutic window as well as potential for toxicity with this medication.    3. Medical Issues Being Addressed:   Tobacco Use Disorder  -- Nicotine patch 14mg /24 hours ordered  -- Smoking cessation encouraged   HTN  -- Continue Norvasc 5mg  qd (BP today 126/82 and 124/97)   Insomnia  -- Continue Trazodone 150mg  po qhs PRN insomnia   Admission Creatinine 1.43  -- Encouraging po hydration and repeat CMP today shows creatinine 1.21  4. Discharge Planning:   -- Social work and case management to assist with discharge planning and identification of hospital follow-up needs prior to discharge  -- Estimated LOS: TBD  -- Discharge Concerns: Need to establish a safety plan; Medication compliance and effectiveness  -- Discharge Goals: Return home with outpatient  referrals for mental health follow-up including medication management/psychotherapy   Comer LocketAmy E Kalup Jaquith, MD, FAPA 08/12/2020, 8:10 AM

## 2020-08-12 NOTE — [facility] Group Notes (Signed)
[facility] LCSW Group Therapy  08/12/2020 2:12 PM  Type of Therapy:  Group Therapy  Participation Level:  Active  Participation Quality:  Appropriate  Affect:  Appropriate  Cognitive:  Appropriate  Insight:  Improving  Engagement in Therapy:  Engaged  Modes of Intervention:  Activity, Discussion and Socialization  Summary of Progress/Problems: Patient attended and participated in group. Patient shared that he uses meditation to help manage his emotions. Patient stated he struggles to manage his grandiose/euphoric mood, because he does not want to have to manage it.   Tifanie Gardiner A Jaycelynn Knickerbocker 08/12/2020, 2:12 PM

## 2020-08-12 NOTE — Progress Notes (Signed)
Recreation Therapy Notes  Date: 12.22.21 Time: 1000 Location: 500 Hall Dayroom  Group Topic: Arts and Crafts  Goal Area(s) Addresses:  Patient will participate in the creative process to complete all crafts.  Patient will interact pro-socially with staff and peers.  Patient will follow directions on the 1st prompt.   Behavioral Response: Minimal  Intervention: Arts and Crafts- printed paper templates, safety scissors, glue, glitter, red/green/white construction paper, markers, colored pencils and chalk  Activity: LRT facilitated a therapeutic art activity to encourage self-expression and creativity in recognition of the approaching holidays. Patients had the options to cut out christmas trees, santa hat, tree ornaments, candy canes or free style and Christmas design they could come up with.  Patients were to then decorate their cut outs with the supplies provided.  Education: Socialization, Leisure Education  Education Outcome: Acknowledges understanding  Clinical Observations/Feedback: Pt cut out a christmas tree on red and Health and safety inspector paper.  Pt was social with peers.  Pt sat back and listened to the music as it played.    Caroll Rancher, LRT/CTRS     Lillia Abed, Aulani Shipton A 08/12/2020 1:12 PM

## 2020-08-12 NOTE — Progress Notes (Signed)
[facility] Group Notes:  (Nursing/MHT/Case Management/Adjunct)  Date:  08/12/2020  Time:  11:23 PM  Type of Therapy:  wrap up  Participation Level:  Active  Participation Quality:  Attentive  Affect:  Appropriate  Cognitive:  Appropriate  Insight:  Improving  Engagement in Group:  Improving  Modes of Intervention:  Discussion  Summary of Progress/Problems: pt stated he had a great day and his anxiety level has dropped a lot. Pt stated his goals for the day was to take his medications and he said he was successful. Pt appeared to have good insight into the various topics discussed during wrap up group.   Jacques Navy A 08/12/2020, 11:23 PM

## 2020-08-12 NOTE — Progress Notes (Signed)
Pt gave verbal consent for his grandmother, Binnie Kand, to speak to staff about details of his visit at [facility]. Spoke with her. She was concerned that she hadn't spoken with pt today. Informed her that pt has been attending groups and taking meds as ordered by provider. Pt asking about discharge. Family concerned pt won't be home for Christmas.

## 2020-08-12 NOTE — [facility] Group Notes (Signed)
Adult Psychoeducational Group Note  Date:  08/12/2020 Time:  9:57 AM  Group Topic/Focus:  Goals Group:   The focus of this group is to help patients establish daily goals to achieve during treatment and discuss how the patient can incorporate goal setting into their daily lives to aide in recovery.  Participation Level:  Active  Participation Quality:  Appropriate  Affect:  Appropriate  Cognitive:  Appropriate  Insight: Appropriate and Good  Engagement in Group:  Engaged  Modes of Intervention:  Discussion  Additional Comments:  Pt has a plan to continue to work on his goal everyday. This means building a support system,managing and taking the correct meds, staying positive and letting the staff do what they need to do until pt can be discharged.    Nathan Holloway Nathan Holloway 08/12/2020, 9:57 AM

## 2020-08-12 NOTE — [facility] Group Notes (Signed)
Adult Psychoeducational Group Note  Date:  08/12/2020 Time:  4:07 PM  Group Topic/Focus:  Making Healthy Choices:   The focus of this group is to help patients identify negative/unhealthy choices they were using prior to admission and identify positive/healthier coping strategies to replace them upon discharge.  Participation Level:  Active  Participation Quality:  Appropriate and Attentive  Affect:  Anxious and Appropriate  Cognitive:  Alert and Appropriate  Insight: Appropriate and Good  Engagement in Group:  Engaged  Modes of Intervention:  Discussion and Education  Additional Comments:  Rn led a group focused on the importance medication compliance and symptom management. Pt gave some personal experiences on why he takes his meds.    Nathan Holloway 08/12/2020, 4:07 PM

## 2020-08-12 NOTE — Progress Notes (Addendum)
   08/12/20 2232  Psych Admission Type (Psych Patients Only)  Admission Status Voluntary  Psychosocial Assessment  Patient Complaints Anxiety  Eye Contact Glaring  Facial Expression Animated;Anxious  Affect Anxious;Preoccupied  Speech Pressured;Tangential  Interaction Assertive;Hypervigilant  Motor Activity Fidgety;Restless  Appearance/Hygiene Unremarkable  Behavior Characteristics Cooperative;Anxious  Mood Anxious;Pleasant  Thought Process  Coherency Circumstantial  Content Preoccupation;Blaming others  Delusions None reported or observed  Perception WDL  Hallucination None reported or observed  Judgment Poor  Confusion Mild  Danger to Self  Current suicidal ideation? Denies  Danger to Others  Danger to Others None reported or observed   Pt denies SI, HI, AVH and pain. Pt rated anxiety 6/10. Pt worried about not being out in time for Christmas with his family. Pt takes meds as ordered and has been attending groups.

## 2020-08-12 NOTE — Progress Notes (Signed)
Pt stated he was feeling restless , so pt was given Ativan per [facility]

## 2020-08-13 NOTE — [facility] Group Notes (Signed)
The focus of this group is to help patients establish daily goals to achieve during treatment and discuss how the patient can incorporate goal setting into their daily lives to aide in recovery.Patient attended group. 

## 2020-08-13 NOTE — Progress Notes (Signed)
Pt visible in milieu at intervals during shift. Reports he's sleeping well with good appetite. Denies SI, HI, AVH and pain at this time. Attended scheduled unit groups. Animated, hyper-verbal with pressured speech and is fidgety on interactions. Remains preoccupied on "holistic treatment, without all this chemical. I stopped taking the medications because I gained lot of weight. Holistic treatment is what I really want". Pt appeared less sedated this shift. Emotional support and reassurance offered. Scheduled  medications given with verbal education and effects monitored. Writer encouraged pt to voice concerns. Q 15 minutes safety checks maintained without incident thus far. Pt educated on d/c criteria but with no evidence of learning due to continued request. Pt attended scheduled unit groups, actively participated in activities. Tolerates meals well. Safety maintained on and off unit. Denies discomfort / adverse reactions with medications. Continues to voice concerns for d/c "I just want to leave" I want to leave".

## 2020-08-13 NOTE — Progress Notes (Addendum)
   08/13/20 2134  Psych Admission Type (Psych Patients Only)  Admission Status Voluntary  Psychosocial Assessment  Patient Complaints None  Eye Contact Fair  Facial Expression Other (Comment) (appropriate)  Affect Appropriate to circumstance  Speech Logical/coherent  Interaction Assertive;Hypervigilant  Motor Activity Fidgety;Restless  Appearance/Hygiene Unremarkable  Behavior Characteristics Cooperative  Mood Pleasant  Thought Process  Coherency Circumstantial  Content Preoccupation  Delusions None reported or observed  Perception WDL  Hallucination None reported or observed  Judgment Poor  Confusion Mild  Danger to Self  Current suicidal ideation? Denies  Danger to Others  Danger to Others None reported or observed   Pt still optimistic that he will be discharged before Christmas. Pt pleasant, attending wrap up group and taking meds as prescribed. Denies SI, HI, AVH and pain.

## 2020-08-13 NOTE — Progress Notes (Signed)
[facility] MD Progress Note  08/13/2020 7:57 AM Nathan Holloway  MRN:  [id number]   Subjective:  Nathan Holloway is a 24 y.o. male with a history of bipolar I vs schizoaffective d/o bipolar type, who was initially admitted for inpatient psychiatric hospitalization on 08/07/2020 for management of manic symptoms, agitation, and delusions in the context of medication noncompliance. The patient is currently on Hospital Day 6.   Chart Review: The patient's chart was reviewed and nursing notes were reviewed. The patient's case was discussed in multidisciplinary team meeting. Per nursing notes he has remained anxious with periods of pressured speech with tangential and circumstantial thought organization during interactions. He has shown signs of improvement in terms of his engagement in groups and in terms of thought content. Per [facility] he has been compliant with scheduled medications and has not received any PRN medications for agitation since last assessment.   Information Obtained Today During Patient Interview: The patient was seen and evaluated on the unit. On assessment today the patient states he is feeling better and believes his thoughts have slowed. He reports being able to participate and focus better in groups. He denies medication side-effects but continues to worry about potential weight gain side-effects with his present medication regimen. He remains hopeful he can get out by the holiday. He denies AVH, SI, HI, ideas of reference, or first rank symptoms. He voices no physical complaints.   Principal Problem: Schizoaffective disorder, bipolar type (HCC) Diagnosis: Principal Problem:   Schizoaffective disorder, bipolar type (HCC) Active Problems:   Hypertension   High risk medication use   Nicotine abuse  Total Time Spent in Direct Patient Care:  I personally spent 25 minutes on the unit in direct patient care. The direct patient care time included face-to-face time with the patient, reviewing the  patient's chart, communicating with other professionals, and coordinating care. Greater than 50% of the total encounter time was spent on counseling and coordination of care related to his discharge planning, medication education, and goals of treatment.  Past Psychiatric History: (per Admission H&P) This is the patient's fifth or 6 psychiatric admission.  He had been previously diagnosed with bipolar disorder, schizoaffective disorder as well as schizophrenia in the past.  He admitted to continued marijuana use, as well as tobacco use.  Past Medical History:  Past Medical History:  Diagnosis Date  . Manic behavior (HCC)   . Psychotic affective disorder (HCC) 08/30/2015   History reviewed. No pertinent surgical history.   Family History: History reviewed. No pertinent family history.   Family Psychiatric  History: (Per Admission H&P) He reported a family member with anxiety, but no one with bipolar disorder or any thought disorders.  Social History:  Social History   Substance and Sexual Activity  Alcohol Use No     Social History   Substance and Sexual Activity  Drug Use Not Currently  . Types: Marijuana   Comment: recent usage while out of town this week    Sleep: Good per patient report - slept 6 hours per nursing  Appetite:  Good per patient report  Current Medications: Current Facility-Administered Medications  Medication Dose Route Frequency Provider Last Rate Last Admin  . acetaminophen (TYLENOL) tablet 650 mg  650 mg Oral Q6H PRN Rankin, Shuvon B, NP   650 mg at 08/08/20 2054  . alum & mag hydroxide-simeth (MAALOX/MYLANTA) 200-200-20 MG/5ML suspension 30 mL  30 mL Oral Q4H PRN Rankin, Shuvon B, NP      . amLODipine (NORVASC) tablet 5  mg  5 mg Oral Daily Antonieta Pertlary, Greg Lawson, MD   5 mg at 08/12/20 16100823  . divalproex (DEPAKOTE ER) 24 hr tablet 1,000 mg  1,000 mg Oral Q supper Bartholomew CrewsSingleton, Tauni Sanks E, MD   1,000 mg at 08/12/20 1645  . OLANZapine zydis (ZYPREXA) disintegrating  tablet 10 mg  10 mg Oral Q8H PRN Antonieta Pertlary, Greg Lawson, MD   10 mg at 08/10/20 1039   And  . LORazepam (ATIVAN) tablet 1 mg  1 mg Oral Q6H PRN Antonieta Pertlary, Greg Lawson, MD   1 mg at 08/12/20 [id number]   And  . ziprasidone (GEODON) injection 20 mg  20 mg Intramuscular Q6H PRN Antonieta Pertlary, Greg Lawson, MD      . magnesium hydroxide (MILK OF MAGNESIA) suspension 30 mL  30 mL Oral Daily PRN Rankin, Shuvon B, NP      . nicotine (NICODERM CQ - dosed in mg/24 hours) patch 14 mg  14 mg Transdermal Daily Antonieta Pertlary, Greg Lawson, MD   14 mg at 08/11/20 [id number]  . OLANZapine zydis (ZYPREXA) disintegrating tablet 20 mg  20 mg Oral QHS Antonieta Pertlary, Greg Lawson, MD   20 mg at 08/12/20 2134  . traZODone (DESYREL) tablet 150 mg  150 mg Oral QHS PRN Rankin, Shuvon B, NP   150 mg at 08/12/20 2134   Lab Results:  Results for orders placed or performed during the hospital encounter of 08/07/20 (from the past 48 hour(s))  Valproic acid level     Status: Abnormal   Collection Time: 08/12/20  6:57 AM  Result Value Ref Range   Valproic Acid Lvl 35 (L) 50.0 - 100.0 ug/mL    Comment: Performed at [facility], [address] [place], [place] [address]  Comprehensive metabolic panel     Status: None   Collection Time: 08/12/20  6:57 AM  Result Value Ref Range   Sodium 138 135 - 145 mmol/L   Potassium 4.1 3.5 - 5.1 mmol/L   Chloride 101 98 - 111 mmol/L   CO2 27 22 - 32 mmol/L   Glucose, Bld 89 70 - 99 mg/dL    Comment: Glucose reference range applies only to samples taken after fasting for at least 8 hours.   BUN 13 6 - 20 mg/dL   Creatinine, Ser 4.781.21 0.61 - 1.24 mg/dL   Calcium 9.3 8.9 - 29.510.3 mg/dL   Total Protein 7.0 6.5 - 8.1 g/dL   Albumin 4.1 3.5 - 5.0 g/dL   AST 15 15 - 41 U/L   ALT 13 0 - 44 U/L   Alkaline Phosphatase 67 38 - 126 U/L   Total Bilirubin 0.4 0.3 - 1.2 mg/dL   GFR, Estimated >62>60 >13>60 mL/min    Comment: (NOTE) Calculated using the CKD-EPI Creatinine Equation (2021)    Anion gap 10 5 - 15    Comment:  Performed at [facility], [address] [place], [place] [address]  Lipid panel     Status: Abnormal   Collection Time: 08/12/20  6:57 AM  Result Value Ref Range   Cholesterol 189 0 - 200 mg/dL   Triglycerides 846107 <962<150 mg/dL   HDL 62 >95>40 mg/dL   Total CHOL/HDL Ratio 3.0 RATIO   VLDL 21 0 - 40 mg/dL   LDL Cholesterol 284106 (H) 0 - 99 mg/dL    Comment:        Total Cholesterol/HDL:CHD Risk Coronary Heart Disease Risk Table  Men   Women  1/2 Average Risk   3.4   3.3  Average Risk       5.0   4.4  2 X Average Risk   9.6   7.1  3 X Average Risk  23.4   11.0        Use the calculated Patient Ratio above and the CHD Risk Table to determine the patient's CHD Risk.        ATP III CLASSIFICATION (LDL):  <100     mg/dL   Optimal  295-188  mg/dL   Near or Above                    Optimal  130-159  mg/dL   Borderline  416-606  mg/dL   High  >301     mg/dL   Very High Performed at [facility], [address], [place], [place] [address]   CBC with Differential/Platelet     Status: None   Collection Time: 08/12/20  6:57 AM  Result Value Ref Range   WBC 7.3 4.0 - 10.5 K/uL   RBC 4.96 4.22 - 5.81 MIL/uL   Hemoglobin 14.4 13.0 - 17.0 g/dL   HCT 32.3 55.7 - 32.2 %   MCV 89.1 80.0 - 100.0 fL   MCH 29.0 26.0 - 34.0 pg   MCHC 32.6 30.0 - 36.0 g/dL   RDW 02.5 42.7 - 06.2 %   Platelets 195 150 - 400 K/uL   nRBC 0.0 0.0 - 0.2 %   Neutrophils Relative % 52 %   Neutro Abs 3.9 1.7 - 7.7 K/uL   Lymphocytes Relative 38 %   Lymphs Abs 2.7 0.7 - 4.0 K/uL   Monocytes Relative 8 %   Monocytes Absolute 0.6 0.1 - 1.0 K/uL   Eosinophils Relative 1 %   Eosinophils Absolute 0.1 0.0 - 0.5 K/uL   Basophils Relative 1 %   Basophils Absolute 0.0 0.0 - 0.1 K/uL   Immature Granulocytes 0 %   Abs Immature Granulocytes 0.02 0.00 - 0.07 K/uL    Comment: Performed at [facility], [address] [place], [place] [address]     Blood Alcohol level:  Lab Results  Component Value Date   [facility] <10 08/06/2020   ETH <10 07/04/2020   Metabolic Disorder Labs: Lab Results  Component Value Date   HGBA1C 4.6 (L) 08/06/2020   MPG 85.32 08/06/2020   MPG 82.45 07/04/2020   Lab Results  Component Value Date   PROLACTIN 3.7 (L) 07/04/2020   Lab Results  Component Value Date   CHOL 189 08/12/2020   TRIG 107 08/12/2020   HDL 62 08/12/2020   CHOLHDL 3.0 08/12/2020   VLDL 21 08/12/2020   LDLCALC 106 (H) 08/12/2020   LDLCALC 105 (H) 07/04/2020   Physical Findings:  Musculoskeletal: Strength & Muscle Tone: within normal limits Gait & Station: normal  - steady Patient leans: N/A  Psychiatric Specialty Exam: Physical Exam HENT:     Head: Normocephalic.  Pulmonary:     Effort: Pulmonary effort is normal.  Neurological:     Mental Status: He is alert.     ROS: denies CP, SOB, HA, N/V  Blood pressure 134/72, pulse 83, temperature (!) 97.4 F (36.3 C), temperature source Oral, resp. rate 18, height 5\' 7"  (1.702 m), weight 72.1 kg, SpO2 99 %.Body mass index is 24.9 kg/m.  General Appearance: Adequate hygiene, appears stated age, casually dressed  Eye Contact:  Good  Speech: regular rate and fluency  Volume:  Normal  Mood: describes as good - appears more euthymic  Affect:  Less irritable, calm  Thought Process:  More goal directed and linear today but still ruminative about discharge planning  Orientation:  Full (Time, Place, and Person)  Thought Content:  Denies SI or HI; does not appear to have evidence of obsessions or compulsions; denies AVH, ideas of reference, or first rank symptoms; less guarded on exam today and denies paranoia  Suicidal Thoughts:  No  Homicidal Thoughts:  No  Memory:  Grossly intact to recent recall; remote recall untested  Judgement:  Fair  Insight:  Lacking  Psychomotor Activity: walking around on unit but no agitation or restlessness, no akathesias or tremors   Concentration:  Concentration and Attention improved  Recall:  [employer] of Knowledge:  Fair  Language:  Good  Akathisia:  No  Handed:  Right  Assets:  Communication Skills Desire for Improvement Resilience Social Support  ADL's:  Intact  Cognition:  WNL  Sleep:  Number of Hours: 6   Treatment Plan Summary:  ASSESSMENT: Shota Kohrs is a 24 y.o. male with a history of schizoaffective d/o bipolar type vs bipolar I with psychotic features by hx who was initially admitted for inpatient psychiatric hospitalization on 08/07/2020 for management of manic symptoms, agitation, and delusions in the context of medication noncompliance. He appears to be clearing with dose titration of medications.   Diagnoses / Active Problems: Schizoaffective d/o bipolar type by hx (r/o bipolar I MRE manic severe with psychotic features) HTN High Risk Medication Use   PLAN: 1. Safety and Monitoring:  -- Voluntary admission to inpatient psychiatric unit for safety, stabilization and treatment  -- Daily contact with patient to assess and evaluate symptoms and progress in treatment  -- Patient's case to be discussed in multi-disciplinary team meeting  -- Observation Level : q15 minute checks  -- Vital signs:  q12 hours  -- Precautions: suicide  2. Psychiatric Diagnoses and Treatment:   Schizoaffective d/o bipolar type by hx (r/o bipolar I MRE manic severe with psychotic features) -- Continue VPA ER  in the evening for residual manic symptoms (On 08/12/20: VPA level 35, WBC 7.3, H/H 14.4/44.2, platelets 195, AST 15, ALT 13) - will continue to monitor VPA level with dose titration -- Continue Zyprexa  qhs  -- Continue PRN Zyprexa/ativan/IM Geodon for agitation protocol  -- Metabolic profile and EKG monitoring obtained while on an atypical antipsychotic (BMI: 24.90 Lipid Panel: total cholesterol 189, HDL 62, LDL 106, triglycerides 107 ; HbgA1c:4.6; QTc:413)   -- Encouraged patient to participate  in unit milieu and in scheduled group therapies   -- Social work to coordinate with family to see when he is nearing clinical baseline  -- Short Term Goals: Ability to identify changes in lifestyle to reduce recurrence of condition will improve, Ability to demonstrate self-control will improve, Compliance with prescribed medications will improve and Ability to identify triggers associated with substance abuse/mental health issues will improve  -- Long Term Goals: Improvement in symptoms so as ready for discharge   High Risk Medication Use: -- The complexity of this patient's case involves drug therapy with VPA ER which requires intensive monitoring for toxicity. This is in part due to a narrow therapeutic window as well as potential for toxicity with this medication.     3. Medical Issues Being Addressed:   Tobacco Use Disorder  -- Nicotine patch /24 hours ordered  -- Smoking cessation  encouraged   HTN  -- Continue Norvasc 5mg  qd (BP today 134/72)   Insomnia  -- Continue Trazodone 150mg  po qhs PRN insomnia   Admission Creatinine 1.43  -- Encouraging po hydration and repeat CMP today shows creatinine 1.21  4. Discharge Planning:   -- Social work and case management to assist with discharge planning and identification of hospital follow-up needs prior to discharge  -- Estimated LOS: 1-2 days  -- Discharge Concerns: Need to establish a safety plan; Medication compliance and effectiveness  -- Discharge Goals: Return home with outpatient referrals for mental health follow-up including medication management/psychotherapy   , MD, FAPA 08/13/2020, 7:57 AM

## 2020-08-13 NOTE — [facility] Group Notes (Signed)
Occupational Therapy Group Note Date: 08/13/2020 Group Topic/Focus: Relaxation  Group Description: Group encouraged increased engagement and participation through a guided imagery mindfulness practice to promote relaxation and symptom management. Patients were encouraged to share their own relaxation strategies/practices and were then encouraged to follow along with the guided imagery script provided. Patients reported benefit and feeling more relaxed post group.   Therapeutic Goal(s): Identify personal relaxation strategies and techniques to reduce stress and emotional distress Participation Level: Active   Participation Quality: Independent   Behavior: Calm and Cooperative   Speech/Thought Process: Focused   Affect/Mood: Euthymic   Insight: Fair   Judgement: Fair   Individualization: Nathan Holloway was active in their participation of group activity, appeared engaged in guided imagery script; observed with eyes closed, breathing, quiet/no interruptions. Pt identified feeling "relaxed" post activity and identified "marketing on social media, stuff on TikTok" as an additional strategy or activity they could engage in to promote relaxation.   Modes of Intervention: Activity, Education and Support  Patient Response to Interventions:  Attentive and Engaged   Plan: Continue to engage patient in OT groups 2 - 3x/week.  08/13/2020  Donne Hazel, MOT, OTR/L

## 2020-08-14 NOTE — Progress Notes (Signed)
[facility] MD Progress Note  08/14/2020 1:34 PM Nathan Holloway  MRN:  [id number] Subjective:  Patient is a 24 year old male with a past psychiatric history significant for bipolar disorder with psychotic features versus schizoaffective disorder; bipolar type. He was admitted secondary to noncompliance with his treatment and reappearance of manic and psychotic symptoms.  Objective: Patient is seen and examined.  Patient is a 24 year old male with the above-stated past psychiatric history who is seen in follow-up.  He is doing better than he was on admission.  His mania is decreased, his delusional thinking has decreased.  We had a long conversation today about compliance with medications.  We discussed the fact that he had been hospitalized 5 times, and trying to get him to recognize the need for medications.  He attempted to convince me that he had gained 20 pounds on the medications he had been written after discharge, but I countered that with the fact that he had only been on his medicines for a few days prior to becoming manic again and his mother bring him back to the hospital.  He continues to state that he had "a sixpack and it went to a 2 pack".  Again I reiterated that it would be hard to believe that he gained 20 pounds and lost 20 pounds in the few days that he had been out of the hospital.  His drug screen on admission this time was negative, and he did admit that he had been compliant with not using marijuana.  We discussed the fact that I felt that if he continued to have noncompliance with the oral medicines the long-acting injectable medicine may be our only option.  He denied auditory or visual hallucinations.  He denied suicidal or homicidal ideation.  He is sleeping better and got 6 hours of sleep last night.  His vital signs are stable, he is afebrile.  Review of his laboratories revealed laboratories from 12/22 with normal electrolytes.  His creatinine was 1.21.  It has been higher than that each  time is been checked excepted discharge on his last hospitalization.  Liver function enzymes were normal.  Lipid panel was normal.  CBC was normal.  Differential was normal.  His Depakote level on 12/22 was only 35.  Hemoglobin A1c is 4.6.  His urinalysis from 12/18 showed greater than 500 mg per DL of glucose.  We will repeat his urinalysis.  Drug screen was negative.  EKG showed a sinus arrhythmia with a normal QTc interval.  Principal Problem: Schizoaffective disorder, bipolar type (HCC) Diagnosis: Principal Problem:   Schizoaffective disorder, bipolar type (HCC) Active Problems:   Hypertension   High risk medication use   Nicotine abuse  Total Time spent with patient: 20 minutes  Past Psychiatric History: See admission H&P  Past Medical History:  Past Medical History:  Diagnosis Date  . Manic behavior (HCC)   . Psychotic affective disorder (HCC) 08/30/2015   History reviewed. No pertinent surgical history. Family History: History reviewed. No pertinent family history. Family Psychiatric  History: See admission H&P Social History:  Social History   Substance and Sexual Activity  Alcohol Use No     Social History   Substance and Sexual Activity  Drug Use Not Currently  . Types: Marijuana   Comment: recent usage while out of town this week    Social History   Socioeconomic History  . Marital status: Single    Spouse name: Not on file  . Number of children: Not on file  .  Years of education: Not on file  . Highest education level: Not on file  Occupational History  . Not on file  Tobacco Use  . Smoking status: Never Smoker  . Smokeless tobacco: Never Used  Vaping Use  . Vaping Use: Every day  . Substances: Nicotine, Flavoring  Substance and Sexual Activity  . Alcohol use: No  . Drug use: Not Currently    Types: Marijuana    Comment: recent usage while out of town this week  . Sexual activity: Not Currently  Other Topics Concern  . Not on file  Social History  Narrative  . Not on file   Social Determinants of Health   Financial Resource Strain: Not on file  Food Insecurity: Not on file  Transportation Needs: Not on file  Physical Activity: Not on file  Stress: Not on file  Social Connections: Not on file   Additional Social History:                         Sleep: Fair  Appetite:  Good  Current Medications: Current Facility-Administered Medications  Medication Dose Route Frequency Provider Last Rate Last Admin  . acetaminophen (TYLENOL) tablet 650 mg  650 mg Oral Q6H PRN Rankin, Shuvon B, NP   650 mg at 08/08/20 2054  . alum & mag hydroxide-simeth (MAALOX/MYLANTA) 200-200-20 MG/5ML suspension 30 mL  30 mL Oral Q4H PRN Rankin, Shuvon B, NP      . amLODipine (NORVASC) tablet 5 mg  5 mg Oral Daily Antonieta Pert, MD   5 mg at 08/14/20 0817  . divalproex (DEPAKOTE ER) 24 hr tablet 1,000 mg  1,000 mg Oral Q supper Bartholomew Crews E, MD   1,000 mg at 08/13/20 1732  . OLANZapine zydis (ZYPREXA) disintegrating tablet 10 mg  10 mg Oral Q8H PRN Antonieta Pert, MD   10 mg at 08/10/20 1039   And  . LORazepam (ATIVAN) tablet 1 mg  1 mg Oral Q6H PRN Antonieta Pert, MD   1 mg at 08/12/20 1062   And  . ziprasidone (GEODON) injection 20 mg  20 mg Intramuscular Q6H PRN Antonieta Pert, MD      . magnesium hydroxide (MILK OF MAGNESIA) suspension 30 mL  30 mL Oral Daily PRN Rankin, Shuvon B, NP      . OLANZapine zydis (ZYPREXA) disintegrating tablet 20 mg  20 mg Oral QHS Antonieta Pert, MD   20 mg at 08/13/20 2047  . traZODone (DESYREL) tablet 150 mg  150 mg Oral QHS PRN Rankin, Shuvon B, NP   150 mg at 08/13/20 2047    Lab Results: No results found for this or any previous visit (from the past 48 hour(s)).  Blood Alcohol level:  Lab Results  Component Value Date   ETH <10 08/06/2020   ETH <10 07/04/2020    Metabolic Disorder Labs: Lab Results  Component Value Date   HGBA1C 4.6 (L) 08/06/2020   MPG 85.32 08/06/2020    MPG 82.45 07/04/2020   Lab Results  Component Value Date   PROLACTIN 3.7 (L) 07/04/2020   Lab Results  Component Value Date   CHOL 189 08/12/2020   TRIG 107 08/12/2020   HDL 62 08/12/2020   CHOLHDL 3.0 08/12/2020   VLDL 21 08/12/2020   LDLCALC 106 (H) 08/12/2020   LDLCALC 105 (H) 07/04/2020    Physical Findings: AIMS: Facial and Oral Movements Muscles of Facial Expression: None, normal Lips and Perioral  Area: None, normal Jaw: None, normal Tongue: None, normal,Extremity Movements Upper (arms, wrists, hands, fingers): None, normal Lower (legs, knees, ankles, toes): None, normal, Trunk Movements Neck, shoulders, hips: None, normal, Overall Severity Severity of abnormal movements (highest score from questions above): None, normal Incapacitation due to abnormal movements: None, normal Patient's awareness of abnormal movements (rate only patient's report): No Awareness, Dental Status Current problems with teeth and/or dentures?: No Does patient usually wear dentures?: No  CIWA:    COWS:     Musculoskeletal: Strength & Muscle Tone: within normal limits Gait & Station: normal Patient leans: N/A  Psychiatric Specialty Exam: Physical Exam Vitals and nursing note reviewed.  Constitutional:      Appearance: Normal appearance.  HENT:     Head: Normocephalic and atraumatic.  Pulmonary:     Effort: Pulmonary effort is normal.  Neurological:     General: No focal deficit present.     Mental Status: He is alert and oriented to person, place, and time.     Review of Systems  Blood pressure (!) 131/94, pulse 87, temperature 97.6 F (36.4 C), temperature source Oral, resp. rate 18, height 5\' 7"  (1.702 m), weight 72.1 kg, SpO2 98 %.Body mass index is 24.9 kg/m.  General Appearance: Casual  Eye Contact:  Fair  Speech:  Normal Rate  Volume:  Normal  Mood:  Anxious  Affect:  Congruent  Thought Process:  Coherent and Descriptions of Associations: Circumstantial   Orientation:  Full (Time, Place, and Person)  Thought Content:  Logical  Suicidal Thoughts:  No  Homicidal Thoughts:  No  Memory:  Immediate;   Fair Recent;   Fair Remote;   Fair  Judgement:  Intact  Insight:  Lacking  Psychomotor Activity:  Increased  Concentration:  Concentration: Fair and Attention Span: Fair  Recall:  of Knowledge:  Fair  Language:  Good  Akathisia:  Negative  Handed:  Right  AIMS (if indicated):     Assets:  Desire for Improvement Housing Resilience Social Support  ADL's:  Intact  Cognition:  WNL  Sleep:  Number of Hours: 6     Treatment Plan Summary: Daily contact with patient to assess and evaluate symptoms and progress in treatment, Medication management and Plan : Patient is seen and examined.  Patient is a 24 year old male with the above-stated past psychiatric history who is seen in follow-up.   Diagnosis: 1.  Schizoaffective disorder; bipolar type versus bipolar disorder; most recently manic, severe with psychotic features 2.  Glucosuria  Pertinent findings on examination today: 1.  Pressured speech and tangentiality have decreased. 2.  No suicidal or homicidal ideation. 3.  Still a great deal of resistance to excepting his diagnosis. 4.  Sleep is improved. 5.  No auditory or visual hallucinations. 6.  No euphoria.  Plan: 1.  Continue amlodipine 5 mg p.o. daily for hypertension. 2.  Continue Depakote ER 1000 mg p.o. every afternoon for mood stability. 3.  Continue Zyprexa agitation protocol as needed 4.  Continue Zyprexa 20 mg p.o. nightly for mood stability and psychosis. 5.  Continue trazodone 150 mg p.o. nightly as needed insomnia. 6.  I have asked the patient to contact his mother, then we will contact his mother and see how he is improving. 7.  We will check a CBC, differential, CMP and valproic acid level in a.m. 12/26 if he remains in the hospital. 8.  Disposition planning-in progress.  1/27,  MD 08/14/2020, 1:34 PM

## 2020-08-14 NOTE — Tx Team (Signed)
Interdisciplinary Treatment and Diagnostic Plan Update  08/14/2020 Time of Session: 9:30am Nathan Holloway MRN: [id number]  Principal Diagnosis: Schizoaffective disorder, bipolar type [facility])  Secondary Diagnoses: Principal Problem:   Schizoaffective disorder, bipolar type (HCC) Active Problems:   Hypertension   High risk medication use   Nicotine abuse   Current Medications:  Current Facility-Administered Medications  Medication Dose Route Frequency Provider Last Rate Last Admin  . acetaminophen (TYLENOL) tablet 650 mg  650 mg Oral Q6H PRN Rankin, Shuvon B, NP   650 mg at 08/08/20 2054  . alum & mag hydroxide-simeth (MAALOX/MYLANTA) 200-200-20 MG/5ML suspension 30 mL  30 mL Oral Q4H PRN Rankin, Shuvon B, NP      . amLODipine (NORVASC) tablet 5 mg  5 mg Oral Daily Antonieta Pert, MD   5 mg at 08/14/20 0817  . divalproex (DEPAKOTE ER) 24 hr tablet 1,000 mg  1,000 mg Oral Q supper Bartholomew Crews E, MD   1,000 mg at 08/13/20 1732  . OLANZapine zydis (ZYPREXA) disintegrating tablet 10 mg  10 mg Oral Q8H PRN Antonieta Pert, MD   10 mg at 08/10/20 1039   And  . LORazepam (ATIVAN) tablet 1 mg  1 mg Oral Q6H PRN Antonieta Pert, MD   1 mg at 08/12/20 4132   And  . ziprasidone (GEODON) injection 20 mg  20 mg Intramuscular Q6H PRN Antonieta Pert, MD      . magnesium hydroxide (MILK OF MAGNESIA) suspension 30 mL  30 mL Oral Daily PRN Rankin, Shuvon B, NP      . OLANZapine zydis (ZYPREXA) disintegrating tablet 20 mg  20 mg Oral QHS Antonieta Pert, MD   20 mg at 08/13/20 2047  . traZODone (DESYREL) tablet 150 mg  150 mg Oral QHS PRN Rankin, Shuvon B, NP   150 mg at 08/13/20 2047   PTA Medications: Medications Prior to Admission  Medication Sig Dispense Refill Last Dose  . divalproex (DEPAKOTE) 250 MG DR tablet Take 3 tablets (750 mg total) by mouth daily with supper.     . metFORMIN (GLUCOPHAGE) 500 MG tablet Take 1 tablet (500 mg total) by mouth daily with breakfast. (Patient  not taking: No sig reported) 30 tablet 0   . OLANZapine zydis (ZYPREXA) 15 MG disintegrating tablet Take 1 tablet (15 mg total) by mouth at bedtime. (Patient not taking: No sig reported) 30 tablet 2   . traZODone (DESYREL) 100 MG tablet Take 1 tablet (100 mg total) by mouth at bedtime as needed for sleep. (Patient not taking: No sig reported) 30 tablet 0     Patient Stressors: Marital or family conflict  Patient Strengths: Average or above average intelligence Physical Health Supportive family/friends  Treatment Modalities: Medication Management, Group therapy, Case management,  1 to 1 session with clinician, Psychoeducation, Recreational therapy.   Physician Treatment Plan for Primary Diagnosis: Schizoaffective disorder, bipolar type (HCC) Long Term Goal(s): Improvement in symptoms so as ready for discharge   Short Term Goals: Ability to identify changes in lifestyle to reduce recurrence of condition will improve Ability to demonstrate self-control will improve Compliance with prescribed medications will improve Ability to identify triggers associated with substance abuse/mental health issues will improve  Medication Management: Evaluate patient's response, side effects, and tolerance of medication regimen.  Therapeutic Interventions: 1 to 1 sessions, Unit Group sessions and Medication administration.  Evaluation of Outcomes: Progressing  Physician Treatment Plan for Secondary Diagnosis: Principal Problem:   Schizoaffective disorder, bipolar type (HCC) Active Problems:  Hypertension   High risk medication use   Nicotine abuse  Long Term Goal(s): Improvement in symptoms so as ready for discharge   Short Term Goals: Ability to identify changes in lifestyle to reduce recurrence of condition will improve Ability to demonstrate self-control will improve Compliance with prescribed medications will improve Ability to identify triggers associated with substance abuse/mental health  issues will improve     Medication Management: Evaluate patient's response, side effects, and tolerance of medication regimen.  Therapeutic Interventions: 1 to 1 sessions, Unit Group sessions and Medication administration.  Evaluation of Outcomes: Progressing   RN Treatment Plan for Primary Diagnosis: Schizoaffective disorder, bipolar type (HCC) Long Term Goal(s): Knowledge of disease and therapeutic regimen to maintain health will improve  Short Term Goals: Ability to remain free from injury will improve, Ability to verbalize frustration and anger appropriately will improve, Ability to identify and develop effective coping behaviors will improve and Compliance with prescribed medications will improve  Medication Management: RN will administer medications as ordered by provider, will assess and evaluate patient's response and provide education to patient for prescribed medication. RN will report any adverse and/or side effects to prescribing provider.  Therapeutic Interventions: 1 on 1 counseling sessions, Psychoeducation, Medication administration, Evaluate responses to treatment, Monitor vital signs and CBGs as ordered, Perform/monitor CIWA, COWS, AIMS and Fall Risk screenings as ordered, Perform wound care treatments as ordered.  Evaluation of Outcomes: Progressing   LCSW Treatment Plan for Primary Diagnosis: Schizoaffective disorder, bipolar type (HCC) Long Term Goal(s): Safe transition to appropriate next level of care at discharge, Engage patient in therapeutic group addressing interpersonal concerns.  Short Term Goals: Engage patient in aftercare planning with referrals and resources, Increase social support, Increase emotional regulation, Identify triggers associated with mental health/substance abuse issues and Increase skills for wellness and recovery  Therapeutic Interventions: Assess for all discharge needs, 1 to 1 time with Social worker, Explore available resources and support  systems, Assess for adequacy in community support network, Educate family and significant other(s) on suicide prevention, Complete Psychosocial Assessment, Interpersonal group therapy.  Evaluation of Outcomes: Progressing   Progress in Treatment: Attending groups: Yes. Participating in groups: Yes. Taking medication as prescribed: Yes. Toleration medication: Yes. Family/Significant other contact made: Yes, individual(s) contacted:  with mother Patient understands diagnosis: Yes. Discussing patient identified problems/goals with staff: Yes. Medical problems stabilized or resolved: Yes. Denies suicidal/homicidal ideation: Yes. Issues/concerns per patient self-inventory: No.   New problem(s) identified: No, Describe:  none  New Short Term/Long Term Goal(s): medication stabilization, elimination of SI thoughts, development of comprehensive mental wellness plan.   Patient Goals:  "To get out and to find the right medication that does not make me feel like a zombie"  Discharge Plan or Barriers: Patient it to follow up with [facility] for medication management. Pt will be referred to [facility].   Reason for Continuation of Hospitalization: Mania Medication stabilization  Estimated Length of Stay: 3-5 days  Attendees: Patient:  08/10/2020   Physician:  08/10/2020   Nursing:  08/10/2020   RN Care Manager: 08/10/2020  Social Worker: Jacinta Shoe, LCSW 08/10/2020   Recreational Therapist:  08/10/2020   Other:  08/10/2020   Other:  08/10/2020   Other: 08/10/2020       Scribe for Treatment Team: Jacinta Shoe, LCSW 08/14/2020 10:43 AM

## 2020-08-14 NOTE — Progress Notes (Signed)
°   08/13/20 2134  Psych Admission Type (Psych Patients Only)  Admission Status Voluntary  Psychosocial Assessment  Patient Complaints None  Eye Contact Fair  Facial Expression Other (Comment) (appropriate)  Affect Appropriate to circumstance  Speech Logical/coherent  Interaction Assertive;Hypervigilant  Motor Activity Fidgety;Restless  Appearance/Hygiene Unremarkable  Behavior Characteristics Cooperative  Mood Pleasant  Thought Process  Coherency Circumstantial  Content Preoccupation  Delusions None reported or observed  Perception WDL  Hallucination None reported or observed  Judgment Poor  Confusion Mild  Danger to Self  Current suicidal ideation? Denies  Danger to Others  Danger to Others None reported or observed

## 2020-08-15 MED ORDER — OLANZAPINE 10 MG PO TABS
20.0000 mg | ORAL_TABLET | Freq: Every day | ORAL | Status: DC
  Filled 2020-08-15: qty 2
  Filled 2020-08-15: qty 6

## 2020-08-15 MED ORDER — AMLODIPINE BESYLATE 5 MG PO TABS: 5.0000 mg | ORAL_TABLET | Freq: Every day | ORAL | 0 refills | Status: DC

## 2020-08-15 MED ORDER — OLANZAPINE 20 MG PO TABS: 20.0000 mg | ORAL_TABLET | Freq: Every day | ORAL | 0 refills | Status: DC

## 2020-08-15 MED ORDER — DIVALPROEX SODIUM ER 500 MG PO TB24: 1000.0000 mg | ORAL_TABLET | Freq: Every day | ORAL | 0 refills | Status: DC

## 2020-08-15 MED ORDER — TRAZODONE HCL 150 MG PO TABS
150.0000 mg | ORAL_TABLET | Freq: Every evening | ORAL | 0 refills | Status: DC | PRN
Start: 2020-08-15 — End: 2020-09-08

## 2020-08-15 NOTE — Progress Notes (Signed)
   08/15/20 0900  Psych Admission Type (Psych Patients Only)  Admission Status Voluntary  Psychosocial Assessment  Patient Complaints None  Eye Contact Fair  Facial Expression Other (Comment) (appropriate)  Affect Appropriate to circumstance  Speech Logical/coherent  Interaction Assertive;Hypervigilant  Motor Activity Fidgety;Restless  Appearance/Hygiene Unremarkable  Thought Process  Coherency Circumstantial  Content WDL  Delusions None reported or observed  Perception WDL  Hallucination None reported or observed  Judgment Poor  Confusion Mild  Danger to Self  Current suicidal ideation? Denies  Danger to Others  Danger to Others None reported or observed

## 2020-08-15 NOTE — Progress Notes (Signed)
  [facility] Adult Case Management Discharge Plan :  Will you be returning to the same living situation after discharge:  Yes,  with family At discharge, do you have transportation home?: Yes,  family Do you have the ability to pay for your medications: Yes,  insurance  Release of information consent forms completed and emailed to Medical Records, then turned in to Medical Records by CSW.  Patient to Follow up at:  Follow-up Information    [facility]. Call on 08/17/2020.   Specialty: Behavioral Health Why: A referral has been made on your behalf to this provider for PHP and/or IOP services.  Please call them on Monday 12/27 to arrange to start services. Contact information: [address] [place] [address] [phone]               Next level of care provider has access to [facility] Link:no  Safety Planning and Suicide Prevention discussed: Yes,  with mother  Have you used any form of tobacco in the last 30 days? (Cigarettes, Smokeless Tobacco, Cigars, and/or Pipes): No  Has patient been referred to the Quitline?: N/A patient is not a smoker  Patient has been referred for addiction treatment: Yes  Lynnell Chad, LCSW 08/15/2020, 9:28 AM

## 2020-08-15 NOTE — Progress Notes (Signed)
Pt discharged to lobby. Mom, who is legal guardian, was present to pick up pt.  Pt was stable and appreciative at that time. All papers, samples  were given and valuables returned. Verbal understanding expressed. Denies SI/HI and A/VH. Pt given opportunity to express concerns and ask questions.

## 2020-08-15 NOTE — [facility] Suicide Risk Assessment (Signed)
[facility] Discharge Suicide Risk Assessment   Principal Problem: Schizoaffective disorder, bipolar type (HCC) Discharge Diagnoses: Principal Problem:   Schizoaffective disorder, bipolar type (HCC) Active Problems:   Hypertension   High risk medication use   Nicotine abuse   Total Time spent with patient: 20 minutes  Musculoskeletal: Strength & Muscle Tone: within normal limits Gait & Station: normal Patient leans: N/A  Psychiatric Specialty Exam: Review of Systems  All other systems reviewed and are negative.   Blood pressure 125/83, pulse 79, temperature (!) 97.3 F (36.3 C), temperature source Oral, resp. rate 18, height 5\' 7"  (1.702 m), weight 72.1 kg, SpO2 100 %.Body mass index is 24.9 kg/m.  General Appearance: Fairly Groomed  ::  Good  Speech:  Normal Rate409  Volume:  Normal  Mood:  Euthymic  Affect:  Congruent  Thought Process:  Coherent and Descriptions of Associations: Intact  Orientation:  Full (Time, Place, and Person)  Thought Content:  Rumination  Suicidal Thoughts:  No  Homicidal Thoughts:  No  Memory:  Immediate;   Fair Recent;   Fair Remote;   Good  Judgement:  Intact  Insight:  Lacking  Psychomotor Activity:  Normal  Concentration:  Fair  Recall:  [id number] of Knowledge:Fair  Language: Good  Akathisia:  Negative  Handed:  Right  AIMS (if indicated):     Assets:  Desire for Improvement Housing Resilience Social Support  Sleep:  Number of Hours: 8.25  Cognition: WNL  ADL's:  Intact   Mental Status Per Nursing Assessment::   On Admission:  NA  Demographic Factors:  Male and Unemployed  Loss Factors: Financial problems/change in socioeconomic status  Historical Factors: Impulsivity  Risk Reduction Factors:   Living with another person, especially a relative and Positive social support  Continued Clinical Symptoms:  Schizophrenia:   Less than 41 years old  Cognitive Features That Contribute To Risk:  Thought constriction  (tunnel vision)    Suicide Risk:  Minimal: No identifiable suicidal ideation.  Patients presenting with no risk factors but with morbid ruminations; may be classified as minimal risk based on the severity of the depressive symptoms   Follow-up Information    [facility].   Specialty: Behavioral Health Why: A referral has been made on your behalf to this provider for PHP and/or IOP services. Contact information: [address] [place] [phone]               Plan Of Care/Follow-up recommendations:  Activity:  ad lib  [phone], MD 08/15/2020, 7:55 AM

## 2020-08-15 NOTE — Progress Notes (Signed)
[facility] NOVEL CORONAVIRUS (COVID-19) DAILY CHECK-OFF SYMPTOMS - answer yes or no to each - every day NO YES  Have you had a fever in the past 24 hours?  . Fever (Temp > 37.80C / 100F) X   Have you had any of these symptoms in the past 24 hours? . New Cough .  Sore Throat  .  Shortness of Breath .  Difficulty Breathing .  Unexplained Body Aches   X   Have you had any one of these symptoms in the past 24 hours not related to allergies?   . Runny Nose .  Nasal Congestion .  Sneezing   X   If you have had runny nose, nasal congestion, sneezing in the past 24 hours, has it worsened?  X   EXPOSURES - check yes or no X   Have you traveled outside the state in the past 14 days?  X   Have you been in contact with someone with a confirmed diagnosis of COVID-19 or PUI in the past 14 days without wearing appropriate PPE?  X   Have you been living in the same home as a person with confirmed diagnosis of COVID-19 or a PUI (household contact)?    X   Have you been diagnosed with COVID-19?    X              What to do next: Answered NO to all: Answered YES to anything:   Proceed with unit schedule Follow the [facility] Inpatient Flowsheet.   

## 2020-08-15 NOTE — Discharge Summary (Signed)
Physician Discharge Summary Note  Patient:  Nathan Holloway is an 24 y.o., male MRN:  [id number] DOB:  [birth year] Patient phone:  [phone] (home)  Patient address:   [address] [place] [address],  Total Time spent with patient: 15 minutes  Date of Admission:  08/07/2020 Date of Discharge: 08/15/20  Reason for Admission:  mania  Principal Problem: Schizoaffective disorder, bipolar type [facility]) Discharge Diagnoses: Principal Problem:   Schizoaffective disorder, bipolar type (HCC) Active Problems:   Hypertension   High risk medication use   Nicotine abuse   Past Psychiatric History: This is the patient's fifth or 6 psychiatric admission.  He had been previously diagnosed with bipolar disorder, schizoaffective disorder as well as schizophrenia in the past.  He admitted to continued marijuana use, as well as tobacco use.  Past Medical History:  Past Medical History:  Diagnosis Date  . Manic behavior (HCC)   . Psychotic affective disorder (HCC) 08/30/2015   History reviewed. No pertinent surgical history. Family History: History reviewed. No pertinent family history. Family Psychiatric  History: He reported a family member with anxiety, but no one with bipolar disorder or any thought disorders. Social History:  Social History   Substance and Sexual Activity  Alcohol Use No     Social History   Substance and Sexual Activity  Drug Use Not Currently  . Types: Marijuana   Comment: recent usage while out of town this week    Social History   Socioeconomic History  . Marital status: Single    Spouse name: Not on file  . Number of children: Not on file  . Years of education: Not on file  . Highest education level: Not on file  Occupational History  . Not on file  Tobacco Use  . Smoking status: Never Smoker  . Smokeless tobacco: Never Used  Vaping Use  . Vaping Use: Every day  . Substances: Nicotine, Flavoring  Substance and Sexual Activity  . Alcohol use:  No  . Drug use: Not Currently    Types: Marijuana    Comment: recent usage while out of town this week  . Sexual activity: Not Currently  Other Topics Concern  . Not on file  Social History Narrative  . Not on file   Social Determinants of Health   Financial Resource Strain: Not on file  Food Insecurity: Not on file  Transportation Needs: Not on file  Physical Activity: Not on file  Stress: Not on file  Social Connections: Not on file    Hospital Course:  From admission H&P: Patient is a 24 year old male with a known past psychiatric history significant for bipolar disorder with psychotic features but I suspect is more likely schizoaffective disorder; bipolar type who had been hospitalized at our facility recently from 07/05/2020 to 07/11/2020. He presented to the [facility] behavioral health urgent care center as a walk-in on 08/06/2020. The patient was reportedly accompanied by his mother. He was noted to be significantly manic at that time. The mother reported that the patient had not been taking medications. He was noted to be pressured, restless, agitated. He was admitted to the behavioral health urgent care center, and arrangements were made for transfer to the inpatient facility. His Depakote and Zyprexa were reordered. He was transferred to our facility on the evening of 12/17. This morning on examination the patient is severely manic. He is pressured, agitated, delusional. He is extremely tangential. He stated that he has degrees in psychology and other college degrees. He  stated he needs to be on something holistic. He will only take holistic medications. He stated marijuana is the only thing that calms him. He stated he has not eaten in 5 days. His explanation for having not taken his medications was that he was unable to find them. He was admitted to the hospital for evaluation and stabilization.  [name] was admitted for acute mania as described above. He  remained on the [facility] unit for eight days. He was started on Depakote, Zyprexa, and trazodone. He participated in group therapy on the unit. He responded well to treatment with no adverse effects reported. He has shown calmer mood and affect with improved sleep and interaction. He denies any SI/HI/AVH and contracts for safety. He is discharging on the medications listed below. He agrees to follow up at [facility] PHP or IOP (see below). Patient is provided with prescriptions for medications upon discharge. His family is picking him up for discharge home.  Physical Findings: AIMS: Facial and Oral Movements Muscles of Facial Expression: None, normal Lips and Perioral Area: None, normal Jaw: None, normal Tongue: None, normal,Extremity Movements Upper (arms, wrists, hands, fingers): None, normal Lower (legs, knees, ankles, toes): None, normal, Trunk Movements Neck, shoulders, hips: None, normal, Overall Severity Severity of abnormal movements (highest score from questions above): None, normal Incapacitation due to abnormal movements: None, normal Patient's awareness of abnormal movements (rate only patient's report): No Awareness, Dental Status Current problems with teeth and/or dentures?: No Does patient usually wear dentures?: No  CIWA:    COWS:     Musculoskeletal: Strength & Muscle Tone: within normal limits Gait & Station: normal Patient leans: N/A  Psychiatric Specialty Exam: Physical Exam Vitals and nursing note reviewed.  Constitutional:      Appearance: He is well-developed and well-nourished.  Cardiovascular:     Rate and Rhythm: Normal rate.  Pulmonary:     Effort: Pulmonary effort is normal.  Neurological:     Mental Status: He is alert and oriented to person, place, and time.     Review of Systems  Constitutional: Negative.   Respiratory: Negative for cough and shortness of breath.   Psychiatric/Behavioral: Negative for agitation, behavioral  problems, confusion, decreased concentration, dysphoric mood, hallucinations, self-injury, sleep disturbance and suicidal ideas. The patient is not nervous/anxious and is not hyperactive.     Blood pressure 125/83, pulse 79, temperature (!) 97.3 F (36.3 C), temperature source Oral, resp. rate 18, height 5\' 7"  (1.702 m), weight 72.1 kg, SpO2 100 %.Body mass index is 24.9 kg/m.  See MD's discharge SRA     Have you used any form of tobacco in the last 30 days? (Cigarettes, Smokeless Tobacco, Cigars, and/or Pipes): No  Has this patient used any form of tobacco in the last 30 days? (Cigarettes, Smokeless Tobacco, Cigars, and/or Pipes)  No  Blood Alcohol level:  Lab Results  Component Value Date   ETH <10 08/06/2020   ETH <10 07/04/2020    Metabolic Disorder Labs:  Lab Results  Component Value Date   HGBA1C 4.6 (L) 08/06/2020   MPG 85.32 08/06/2020   MPG 82.45 07/04/2020   Lab Results  Component Value Date   PROLACTIN 3.7 (L) 07/04/2020   Lab Results  Component Value Date   CHOL 189 08/12/2020   TRIG 107 08/12/2020   HDL 62 08/12/2020   CHOLHDL 3.0 08/12/2020   VLDL 21 08/12/2020   LDLCALC 106 (H) 08/12/2020   LDLCALC 105 (H) 07/04/2020    See  Psychiatric Specialty Exam and Suicide Risk Assessment completed by Attending Physician prior to discharge.  Discharge destination:  Home  Is patient on multiple antipsychotic therapies at discharge:  No   Has Patient had three or more failed trials of antipsychotic monotherapy by history:  No  Recommended Plan for Multiple Antipsychotic Therapies: NA  Discharge Instructions    Diet - low sodium heart healthy   Complete by: As directed    Increase activity slowly   Complete by: As directed      Allergies as of 08/15/2020      Reactions   Risperidone And Related Other (See Comments)   Diastatic      Medication List    STOP taking these medications   divalproex 250 MG DR tablet Commonly known as: DEPAKOTE Replaced  by: divalproex 500 MG 24 hr tablet   metFORMIN 500 MG tablet Commonly known as: GLUCOPHAGE   olanzapine zydis 15 MG disintegrating tablet Commonly known as: ZYPREXA Replaced by: OLANZapine 20 MG tablet     TAKE these medications     Indication  amLODipine 5 MG tablet Commonly known as: NORVASC Take 1 tablet (5 mg total) by mouth daily. Start taking on: August 16, 2020  Indication: High Blood Pressure Disorder   divalproex 500 MG 24 hr tablet Commonly known as: DEPAKOTE ER Take 2 tablets (1,000 mg total) by mouth daily with supper. Replaces: divalproex 250 MG DR tablet  Indication: Manic Phase of Manic-Depression   OLANZapine 20 MG tablet Commonly known as: ZYPREXA Take 1 tablet (20 mg total) by mouth at bedtime. Replaces: olanzapine zydis 15 MG disintegrating tablet  Indication: Manic Phase of Manic-Depression   traZODone 150 MG tablet Commonly known as: DESYREL Take 1 tablet (150 mg total) by mouth at bedtime as needed for sleep. What changed:   medication strength  how much to take  Indication: Trouble Sleeping       Follow-up Information    [facility] [place] [facility]. Call on 08/17/2020.   Specialty: Behavioral Health Why: A referral has been made on your behalf to this provider for PHP and/or IOP services.  Please call them on Monday 12/27 to arrange to start services. Contact information: [address] [place] [address] [phone]               Follow-up recommendations: Activity as tolerated. Diet as recommended by primary care physician. Keep all scheduled follow-up appointments as recommended.   Comments:   Patient is instructed to take all prescribed medications as recommended. Report any side effects or adverse reactions to your outpatient psychiatrist. Patient is instructed to abstain from alcohol and illegal drugs while on prescription medications. In the event of worsening symptoms, patient is instructed to call the  crisis hotline, 911, or go to the nearest emergency department for evaluation and treatment.  Signed: Aldean Baker, NP 08/15/2020, 10:01 AM

## 2020-09-08 ENCOUNTER — Other Ambulatory Visit (HOSPITAL_COMMUNITY): Payer: Self-pay | Admitting: Psychiatry

## 2020-09-08 MED ORDER — DIVALPROEX SODIUM ER 500 MG PO TB24: ORAL_TABLET | ORAL | 0 refills | Status: DC

## 2020-09-08 MED ORDER — AMLODIPINE BESYLATE 5 MG PO TABS: 5.0000 mg | ORAL_TABLET | Freq: Every day | ORAL | 0 refills | Status: DC

## 2020-09-08 MED ORDER — TRAZODONE HCL 150 MG PO TABS: 150.0000 mg | ORAL_TABLET | Freq: Every evening | ORAL | 0 refills | Status: DC | PRN

## 2020-09-08 MED ORDER — OLANZAPINE 20 MG PO TABS
20.0000 mg | ORAL_TABLET | Freq: Every day | ORAL | 0 refills | Status: DC
Start: 2020-09-08 — End: 2020-10-06

## 2020-09-18 ENCOUNTER — Other Ambulatory Visit: Payer: Self-pay

## 2020-09-18 ENCOUNTER — Ambulatory Visit (INDEPENDENT_AMBULATORY_CARE_PROVIDER_SITE_OTHER): Payer: No Payment, Other | Admitting: Licensed Clinical Social Worker

## 2020-09-18 DIAGNOSIS — F250 Schizoaffective disorder, bipolar type: Secondary | ICD-10-CM

## 2020-09-18 NOTE — Progress Notes (Signed)
Comprehensive Clinical Assessment (CCA) Note  09/18/2020 Nathan Holloway [id number]  Chief Complaint:  Chief Complaint  Patient presents with  . Schizophrenia    Schizoaffective disorder bipolar type.    Visit Diagnosis: Schizoaffective Bipolar type.    Client is a 25  year old male. Client is referred by [facility]  for a  Schizoaffective disorder bipolar type.   Client states mental health symptoms as evidenced by:      Increase/decrease in appetite; Weight gain/loss; Difficulty Concentrating; Sleep (too much or little) Increase/decrease in appetite; Weight gain/loss; Difficulty Concentrating; Sleep (too much or little)Depression. Increase/decrease in appetite; Weight gain/loss; Difficulty Concentrating; Sleep (too much or little). Last Filed Value   Duration of Depressive Symptoms Greater than two weeks Greater than two weeksDuration of Depressive Symptoms. Greater than two weeks. Last Filed Value  Mania Racing thoughts; Change in energy/activity; Increased Energy; Overconfidence; Recklessness; Euphoria; IrritabilityMania. Racing thoughts; Change in energy/activity; Increased Energy; Overconfidence; Recklessness; Euphoria; Irritability. The comment is Pt last manic episode was during his time at [facility] nothing since.. Taken on 09/18/20 1129 Racing thoughts; Change in energy/activity; Increased Energy; Overconfidence; Recklessness; Euphoria; IrritabilityMania. Racing thoughts; Change in energy/activity; Increased Energy; Overconfidence; Recklessness; Euphoria; Irritability. The comment is Pt last manic episode was during his time at [facility] nothing since.. Last Filed Value  Anxiety Restlessness Restlessness    Client denies suicidal and homicidal ideations currently. [place] suicide scale completed no risk to self-harm.  Client denies hallucinations and delusions currently.  Client was screened for the following SDOH: social interactions   Pain Scale/Intervention: 0/10 no intervention needed at this  time  Nutrition/Intervention: Pt lost 40 lbs. in last 3 months due to medication he was on. LCSW referred pt to PCP for monitoring.   Assessment Information that integrates subjective and objective details with a therapist's professional interpretation:    Pt was alert and oriented x 5. He was dressed casually and groomed. Nathan Holloway presented today with flat mood/affect. He was cooperative and maintained good eye contact.   Nathan Holloway is a [facility] discharge with Hx of schizoaffective disorder bipolar type. He presented 6 weeks ago to [facility] voluntarily with family. Pt states family and friends are his primary support system. He was exhibiting manic behavior and wanted help., He was admitted for 7 days at [facility]. Nathan Holloway feels that his schizoaffective disorder is being managed but not his bipolar disorder. He states the medications he is currently on make him feel like he is on a "box". He reports that his family is the one that wants therapy for him as he has a bad habit of forgetting to take his medications and that is when his symptoms start to become overwhelming. Nathan Holloway reports he wants a male therapist at this time and would like to work on coping skills for his mental health along with being able to practice better medication mgmt. skills. LCSW referred pt to male therapist per his request.   Client meets criteria for: Schizoaffective disorder bipolar type.   Client states use of the following substances: Hx of marijuana      Treatment recommendations are including plan: Treatment plan will be completed by following LCSW.          Client was in agreement with treatment recommendations.   CCA Screening, Triage and Referral (STR)  Patient Reported Information  [facility]   Whom do you see for routine medical problems? Primary Care  Practice/Facility Name: [facility] Name of Contact: Dr. Marlinda Mike  Contact Number:  [phone]  Prescriber Name:  Veva Holes 08/06/2020)  Prescriber Address (if known): [address] [place]   What Is the Reason for Your Visit/Call Today? Follow Assessment  (Phreesia 08/06/2020)  How Long Has This Been Causing You Problems? > than 6 months  What Do You Feel Would Help You the Most Today? Therapy; Medication   Have You Recently Been in Any Inpatient Treatment (Hospital/Detox/Crisis Center/28-Day Program)? Yes  Name/Location of Program/Hospital:[facility]   How Long Were You There? 1 Week  When Were You Discharged? 08/15/2020   Have You Ever Received Services From [employer] Before? Yes  Who Do You [facility] at [facility]   Have You Recently Had Any Thoughts About Hurting Yourself? No  Are You Planning to Commit Suicide/Harm Yourself At This time? No   Have you Recently Had Thoughts About Hurting Someone Nathan Holloway? No   Have You Used Any Alcohol or Drugs in the Past 24 Hours? No  Do You Currently Have a Therapist/Psychiatrist? Yes  Name of Therapist/Psychiatrist: [facility]   Have You Been Recently Discharged From Any Office Practice or Programs? No    CCA Screening Triage Referral Assessment Type of Contact: Face-to-Face  Patient Reported Information Reviewed? Yes  Collateral Involvement: [facility] charting from psych admission  Is CPS involved or ever been involved? Never  Is APS involved or ever been involved? Never   Patient Determined To Be At Risk for Harm To Self or Others Based on Review of Patient Reported Information or Presenting Complaint? No   Location of Assessment: [facility] Services  Does Patient Present under Involuntary Commitment? No  [place] of Residence: [place]  Patient Currently Receiving the Following Services: Individual Therapy  Determination of Need: Emergent (2 hours)  Options For Referral: Medication Management   CCA Biopsychosocial Intake/Chief Complaint:  Bipolar, manic  medication mgmt  Current Symptoms/Problems: Bipolar, manic  Patient Reported Schizophrenia/Schizoaffective Diagnosis in Past: Yes  Strengths: self-awareness and seeking help  Preferences: UTA  Abilities: music, dancing, modeling  Type of Services Patient Feels are Needed: therapy and medication mgmt.  Initial Clinical Notes/Concerns: medication change pt feels that medication keeps him in a "box"  Mental Health Symptoms Depression:  Increase/decrease in appetite; Weight gain/loss; Difficulty Concentrating; Sleep (too much or little)   Duration of Depressive symptoms: Greater than two weeks   Mania:  Racing thoughts; Change in energy/activity; Increased Energy; Overconfidence; Recklessness; Euphoria; Irritability (Pt last manic episode was during his time at [facility] nothing since.)   Anxiety:   Restlessness   Psychosis:  None   Duration of Psychotic symptoms: No data recorded  Trauma:  -- (Pt reports risperdal was a medication he tried once and dystonic reaction)   Obsessions:  None   Compulsions:  None   Inattention:  None   Hyperactivity/Impulsivity:  N/A   Oppositional/Defiant Behaviors:  None   Emotional Irregularity:  None   Other Mood/Personality Symptoms:  No data recorded   Mental Status Exam Appearance and self-care  Stature:  Average   Weight:  Average weight   Clothing:  Age-appropriate   Grooming:  Normal   Cosmetic use:  None   Posture/gait:  Normal   Motor activity:  Not Remarkable   Sensorium  Attention:  Normal   Concentration:  Normal   Orientation:  X5   Recall/memory:  Normal   Affect and Mood  Affect:  Anxious   Mood:  Anxious (manic)   Relating  Eye contact:  Normal  Facial expression:  Anxious   Attitude toward examiner:  Cooperative   Thought and Language  Speech flow: Clear and Coherent   Thought content:  Appropriate to Mood and Circumstances   Preoccupation:  None   Hallucinations:  None   Organization:   No data recorded  [employer] of Knowledge:  No data recorded  Intelligence:  Average   Abstraction:  Normal   Judgement:  Fair   Reality Testing:  No data recorded  Insight:  Poor   Decision Making:  Impulsive   Social Functioning  Social Maturity:  No data recorded  Social Judgement:  No data recorded  Stress  Stressors:  Transitions; Illness; Relationship   Coping Ability:  Exhausted; Overwhelmed   Skill Deficits:  Self-control; Interpersonal   Supports:  Family; Friends/Service system     Religion: Religion/Spirituality Are You A Religious Person?: Yes What is Your Religious Affiliation?: Non-Denominational  Leisure/Recreation: Leisure / Recreation Do You Have Hobbies?: Yes Leisure and Hobbies: "Anything entertainment" Music, modeling, acting, poetry, dancing, video games  Exercise/Diet: Exercise/Diet Do You Exercise?: Yes What Type of Exercise Do You Do?: Weight Training How Many Times a Week Do You Exercise?: 4-5 times a week Have You Gained or Lost A Significant Amount of Weight in the Past Six Months?: Yes-Lost Number of Pounds Lost?: 40 (in last three month s) Do You Follow a Special Diet?: No Do You Have Any Trouble Sleeping?: Yes Explanation of Sleeping Difficulties: sleeping too much pt says   CCA Employment/Education Employment/Work Situation: Employment / Work Situation Employment situation: Employed Where is patient currently employed?: [employer] How long has patient been employed?: start on monday Patient's job has been impacted by current illness: No What is the longest time patient has a held a job?: [employer] Where was the patient employed at that time?: 1 year Has patient ever been in the [employer]?: No  Education: Education Is Patient Currently Attending School?: No Last Grade Completed: 12 Did Garment/textile technologist From [employer]?: Yes Did You Attend College?: Yes What Type of College Degree Do you Have?: Pt has not  yet completed 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 Did You Attend Graduate School?: No Did You Have An Individualized Education Program (IIEP): No Did You Have Any Difficulty At School?: No Patient's Education Has Been Impacted by Current Illness: No   CCA Family/Childhood History Family and Relationship History: Family history Marital status: Single Are you sexually active?: No What is your sexual orientation?: hetrosexual Has your sexual activity been affected by drugs, alcohol, medication, or emotional stress?: none reported Does patient have children?: No  Childhood History:  Childhood History By whom was/is the patient raised?: Mother Additional childhood history information: Father passed away when he was 47 Description of patient's relationship with caregiver when they were a child: good Patient's description of current relationship with people who raised him/her: good How were you disciplined when you got in trouble as a child/adolescent?: pt states he really did not cause trouble as kid Does patient have siblings?: Yes Number of Siblings: 2 Description of patient's current relationship with siblings: both are good Did patient suffer any verbal/emotional/physical/sexual abuse as a child?: No Did patient suffer from severe childhood neglect?: No Has patient ever been sexually abused/assaulted/raped as an adolescent or adult?: No Was the patient ever a victim of a crime or a disaster?: No Witnessed domestic violence?: No Has patient been affected by domestic violence as an  adult?: No  Child/Adolescent Assessment:     CCA Substance Use Alcohol/Drug Use: Alcohol / Drug Use Pain Medications: see MAR Prescriptions: see MAR Over the Counter: see MAR History of alcohol / drug use?: Yes (History of using marijuana) Longest period of sobriety (when/how long): 1 year 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 Hypertension 08/11/2020  . High risk medication use 08/11/2020  . Nicotine abuse 08/11/2020  . Schizoaffective disorder, bipolar type (HCC) 07/11/2020      Weber Cooks, LCSW

## 2020-10-06 ENCOUNTER — Encounter (HOSPITAL_COMMUNITY): Payer: Self-pay | Admitting: Psychiatry

## 2020-10-06 ENCOUNTER — Ambulatory Visit (INDEPENDENT_AMBULATORY_CARE_PROVIDER_SITE_OTHER): Payer: No Payment, Other | Admitting: Psychiatry

## 2020-10-06 ENCOUNTER — Other Ambulatory Visit: Payer: Self-pay

## 2020-10-06 VITALS — BP 137/75 | HR 71 | Ht 67.0 in | Wt 172.0 lb

## 2020-10-06 DIAGNOSIS — F250 Schizoaffective disorder, bipolar type: Secondary | ICD-10-CM

## 2020-10-06 MED ORDER — OLANZAPINE 15 MG PO TABS: 15.0000 mg | ORAL_TABLET | Freq: Every day | ORAL | 2 refills | Status: DC

## 2020-10-06 MED ORDER — TRAZODONE HCL 150 MG PO TABS: 150.0000 mg | ORAL_TABLET | Freq: Every evening | ORAL | 0 refills | Status: DC | PRN

## 2020-10-06 NOTE — Progress Notes (Signed)
Psychiatric Initial Adult Assessment   Patient Identification: Nathan Holloway MRN:  [id number] Date of Evaluation:  10/06/2020 Referral Source: [facility] Chief Complaint:  " I feel like the medications have been above thoughts and I stopped taking my Depakote" Chief Complaint    Medication Management     Visit Diagnosis:    ICD-10-CM   1. Schizoaffective disorder, bipolar type (HCC)  F25.0 OLANZapine (ZYPREXA) 15 MG tablet    traZODone (DESYREL) 150 MG tablet    History of Present Illness: 25 year old male seen today for initial psychiatric evaluation.  He was referred to outpatient psychiatry by [facility] where he was seen on  08/07/2021-08/15/2021. Per chart patient presented with manic behaviors, agitation, and delusions after being noncompliant with his medication.  He has a psychiatric history of  bipolar disorder, schizoaffective disorder, schizophrenia, marijuana use, as well as tobacco use.  He is currently manages on on Depakote 500 mg twice daily, Zyprexa 20 mg daily, and trazodone 150 mg nightly.  Today patient is well-groomed, pleasant, cooperative, and engaged in conversation.  Patient notes that he discontinued his Depakote because he feels like both Depakote and Zyprexa has him stuck in a box.  He notes that he is not happy or sad currently and reports that he is unable to describe his overall feelings.  Provider conducted a GAD-7 and patient scored a 4.  Provider also conducted a PHQ-9 and patient scored a 1.  Patient informed provider that he researched his psychiatric conditions and notes that only Haldol to treat his conditions.  Provider informed patient that other antipsychotics are effective in treating his psychiatric conditions.  He endorsed understanding and agreed however, he notes that he dislikes Zyprexa and ultimately wants to stop it.  Provider informed patient that when he discontinued his medication he was hospitalized over 6 times.  He notes that being off of his medications  make him feel free and confident.  Today he endorses symptoms of hypomania such as grandiosity, distractibility,racing thoughts, and delusions.  Patient notes that currently he is not in a manic state.  He notes that when he is manic it is induced by substance use (noting that he has not used any illegal substances or alcohol since his hospital cessation) or insomnia.  Patient notes that he is spiritual and feels that his delusions are apart of him.  He notes that at times he believes that he is Jesus, the son of Prudy Feeler, or a combination of both which he describes as yin/yang.  He notes that his medications messes up his chakra and reports that he is less confident when he is on medications.  He notes that when he is on medications he feels like his old self which is a person he dislikes because they subdued him.  He informed provider that he has a friend who has several mental health conditions and is not on psychiatric medications.  Provider informed patient that he is different than his friend and may require psychiatric medications.  He endorsed understanding.  Patient notes that in 2013 he was placed on Risperdal and notes that he had an allergic reaction.  He notes that he discontinued Risperdal however informed provider that he would like to be assessed for gynecomastia.  Provider examined patient with a nurse present.  No abnormal findings noted at this time. Patient noted to have minimal subcutaneous fat on his chest. He denies leaking of milk from his breast.  prolactin levels not drawn at this time. If symptoms progress prolactin level  will be drawn. Today he denies SI/HI/VAH.  He has never tried Abilify and reports that he has been on Zyprexa for too long.  Provider asked patient if he would be interested in starting a long acting injectable such as Abilify Maintena.  He notes that he would eventually.  Provider is agreeable to reducing Zyprexa 20 mg to 15 mg.  At next visit patient will start oral  Abilify tablets.  If side effects does not occur he will be cross tapered off of Zyprexa and placed on Abilify to ultimately receive a LAI.  At this time he notes that he does not want to restart Depakote.  No other concerns noted at this time.    Associated Signs/Symptoms: Depression Symptoms:  fatigue, difficulty concentrating, impaired memory, (Hypo) Manic Symptoms:  Delusions, Elevated Mood, Flight of Ideas, Grandiosity, Anxiety Symptoms:  Denies Psychotic Symptoms:  Delusions, PTSD Symptoms: Had a traumatic exposure:  Notes hospital admissions were traumatizing to home  Past Psychiatric History: bipolar disorder, schizoaffective disorder, schizophrenia, marijuana use, as well as tobacco use.   Previous Psychotropic Medications: Zyprexa, Depakote, trazodone  Substance Abuse History in the last 12 months:  No.  Consequences of Substance Abuse: NA  Past Medical History:  Past Medical History:  Diagnosis Date  . Manic behavior (HCC)   . Psychotic affective disorder (HCC) 08/30/2015   No past surgical history on file.  Family Psychiatric History: Sister dysthymia, maternal great uncle has unknown mental health issues  Family History: No family history on file.  Social History:   Social History   Socioeconomic History  . Marital status: Single    Spouse name: Not on file  . Number of children: Not on file  . Years of education: Not on file  . Highest education level: Not on file  Occupational History  . Not on file  Tobacco Use  . Smoking status: Never Smoker  . Smokeless tobacco: Never Used  Vaping Use  . Vaping Use: Every day  . Substances: Nicotine, Flavoring  Substance and Sexual Activity  . Alcohol use: No  . Drug use: Not Currently    Types: Marijuana    Comment: recent usage while out of town this week  . Sexual activity: Not Currently  Other Topics Concern  . Not on file  Social History Narrative  . Not on file   Social Determinants of Health    Financial Resource Strain: Low Risk   . Difficulty of Paying Living Expenses: Not very hard  Food Insecurity: No Food Insecurity  . Worried About Programme researcher, broadcasting/film/video in the Last Year: Never true  . Ran Out of Food in the Last Year: Never true  Transportation Needs: No Transportation Needs  . Lack of Transportation (Medical): No  . Lack of Transportation (Non-Medical): No  Physical Activity: Sufficiently Active  . Days of Exercise per Week: 4 days  . Minutes of Exercise per Session: 60 min  Stress: No Stress Concern Present  . Feeling of Stress : Not at all  Social Connections: Socially Isolated  . Frequency of Communication with Friends and Family: More than three times a week  . Frequency of Social Gatherings with Friends and Family: Twice a week  . Attends Religious Services: Never  . Active Member of Clubs or Organizations: No  . Attends Banker Meetings: Never  . Marital Status: Never married    Additional Social History: Patient resides in [place] with his mother and grandmother. He is single and has no  children. He works Product/process development scientist. He denies alcohol, tobacco, or illegal drug use.   Allergies:   Allergies  Allergen Reactions  . Risperidone And Related Other (See Comments)    Diastatic    Metabolic Disorder Labs: Lab Results  Component Value Date   HGBA1C 4.6 (L) 08/06/2020   MPG 85.32 08/06/2020   MPG 82.45 07/04/2020   Lab Results  Component Value Date   PROLACTIN 3.7 (L) 07/04/2020   Lab Results  Component Value Date   CHOL 189 08/12/2020   TRIG 107 08/12/2020   HDL 62 08/12/2020   CHOLHDL 3.0 08/12/2020   VLDL 21 08/12/2020   LDLCALC 106 (H) 08/12/2020   LDLCALC 105 (H) 07/04/2020   Lab Results  Component Value Date   TSH 0.958 07/04/2020    Therapeutic Level Labs: No results found for: LITHIUM No results found for: CBMZ Lab Results  Component Value Date   VALPROATE 35 (L) 08/12/2020    Current  Medications: Current Outpatient Medications  Medication Sig Dispense Refill  . amLODipine (NORVASC) 5 MG tablet Take 1 tablet (5 mg total) by mouth daily. 30 tablet 0  . OLANZapine (ZYPREXA) 15 MG tablet Take 1 tablet (15 mg total) by mouth at bedtime. 30 tablet 2  . traZODone (DESYREL) 150 MG tablet Take 1 tablet (150 mg total) by mouth at bedtime as needed for sleep. 15 tablet 0   No current facility-administered medications for this visit.    Musculoskeletal: Strength & Muscle Tone: within normal limits Gait & Station: normal Patient leans: N/A  Psychiatric Specialty Exam: Review of Systems  Blood pressure 137/75, pulse 71, height 5\' 7"  (1.702 m), weight 172 lb (78 kg), SpO2 100 %.Body mass index is 26.94 kg/m.  General Appearance: Well Groomed  Eye Contact:  Good  Speech:  Clear and Coherent and Normal Rate  Volume:  Normal  Mood:  Euthymic  Affect:  Appropriate and Non-Congruent  Thought Process:  Coherent, Goal Directed and Linear  Orientation:  Full (Time, Place, and Person)  Thought Content:  Logical, Delusions and Ideas of Reference:   Delusions  Suicidal Thoughts:  No  Homicidal Thoughts:  No  Memory:  Immediate;   Good Recent;   Good Remote;   Good  Judgement:  Fair  Insight:  Good and Fair  Psychomotor Activity:  Normal  Concentration:  Concentration: Good and Attention Span: Good  Recall:  Good  Fund of Knowledge:Good  Language: Good  Akathisia:  No  Handed:  Right  AIMS (if indicated): Not done  Assets:  Communication Skills Desire for Improvement Financial Resources/Insurance Housing Social Support  ADL's:  Intact  Cognition: WNL  Sleep:  Good   Screenings: AIMS   Flowsheet Row [facility]) [facility]  AIMS Total Score 0 0    AUDIT   Flowsheet Row [facility]) [facility]  Alcohol Use Disorder Identification Test Final Score (AUDIT) 0 0    GAD-7   Flowsheet Row Clinical Support from 10/06/2020 in [facility] Counselor from 09/18/2020 in [facility]  Total GAD-7 Score 4 3    PHQ2-9   Flowsheet Row Clinical Support from 10/06/2020 in [facility] Counselor from 09/18/2020 in [facility]  PHQ-2 Total Score 1 1 2   PHQ-9 Total Score 1 - 15    Flowsheet Row [facility]  C-SSRS RISK CATEGORY No Risk No Risk No Risk      Assessment and Plan: Patient endorses symptoms of hypomania.  He notes that he discontinued that Depakote and does not want to restart it.  He also notes that he wants to discontinue Zyprexa.  Provider discussed starting patient on a long-acting injectable such as Abilify.  He notes that he will consider it at his next visit.  If patient is agreeable at next visit he will start oral Abilify.  If side effects does not persist patient will receive a long-acting injectable.  1. Schizoaffective disorder, bipolar type (HCC)  Reduced- OLANZapine (ZYPREXA) 15 MG tablet; Take 1 tablet (15 mg total) by mouth at bedtime.  Dispense: 30 tablet; Refill: 2 Continue- traZODone (DESYREL) 150 MG tablet; Take 1 tablet (150 mg total) by mouth at bedtime as needed for sleep.  Dispense: 15 tablet; Refill: 0  Follow up in 3 months   Shanna CiscoBrittney E Jhair Witherington, NP 2/15/202212:09 PM

## 2020-11-09 ENCOUNTER — Other Ambulatory Visit: Payer: Self-pay

## 2020-11-09 ENCOUNTER — Ambulatory Visit (INDEPENDENT_AMBULATORY_CARE_PROVIDER_SITE_OTHER): Payer: No Payment, Other | Admitting: Clinical

## 2020-11-09 DIAGNOSIS — F250 Schizoaffective disorder, bipolar type: Secondary | ICD-10-CM

## 2020-11-10 NOTE — Progress Notes (Signed)
   THERAPIST PROGRESS NOTE  Session Time: 30 minutes  Participation Level: Active  Behavioral Response: CasualAlertEuthymic  Type of Therapy: Individual Therapy  Treatment Goals addressed: Diagnosis: Depression  Interventions: CBT and Supportive  Summary:  Nathan Holloway is a 25 y.o. male who presents for the scheduled session oriented times five, appropriately dressed, and friendly. Client denied hallucinations and delusions. Client presents for initial session with new therapist. Client reported he was diagnosed with bipolar disorder in 2013 and has had about six episodes since then that have required hospitalization. Client reported by history his episodes have endorsed "euphoric/ high's". Client reported over the past few weeks he has struggled with major depression. Client reported he has never experienced depression before. Client reported he has endorsed impulsively quitting his job, worry, sleeping too much, and lack of appetite. Client reported he has been pondering the thought of "what next in life and what do I want to take". Client reported he has completed three years at [employer] for psychology but does not feel that it is his passion and is not sure if he wants to continue with the degree. Client reported his personal interests include writing poetry, reading and other things. Client reported he wants to work on depression coping skills. Client was screened for the following SDOH: Flowsheet Row Counselor from 11/09/2020 in [facility]  PHQ-9 Total Score 11       Suicidal/Homicidal: Nowithout intent/plan  Therapist Response:  Therapist began the session making introduction and discussing confidentiality. Therapist used CBT to engage with the client to discuss his biopsychosocial symptoms. Therapist used CBT to ask the client to identify therapy goals. Therapist completed updated PHQ SDOH with th client. Therapist addressed questions and  concerns. Client was scheduled for follow up appointment.     Plan: Return again in 4 weeks for individual therapy.  Diagnosis: Schizoaffective disorder, bipolar type   Neena Rhymes [name], LCSW 11/09/2020

## 2020-12-24 ENCOUNTER — Ambulatory Visit (INDEPENDENT_AMBULATORY_CARE_PROVIDER_SITE_OTHER): Payer: No Payment, Other | Admitting: Clinical

## 2020-12-24 ENCOUNTER — Other Ambulatory Visit: Payer: Self-pay

## 2020-12-24 DIAGNOSIS — F250 Schizoaffective disorder, bipolar type: Secondary | ICD-10-CM

## 2020-12-29 ENCOUNTER — Other Ambulatory Visit: Payer: Self-pay | Admitting: Internal Medicine

## 2020-12-30 ENCOUNTER — Encounter (HOSPITAL_COMMUNITY): Payer: Self-pay | Admitting: Psychiatry

## 2020-12-30 ENCOUNTER — Telehealth (INDEPENDENT_AMBULATORY_CARE_PROVIDER_SITE_OTHER): Payer: No Payment, Other | Admitting: Psychiatry

## 2020-12-30 ENCOUNTER — Other Ambulatory Visit: Payer: Self-pay

## 2020-12-30 DIAGNOSIS — F250 Schizoaffective disorder, bipolar type: Secondary | ICD-10-CM | POA: Diagnosis not present

## 2020-12-30 LAB — CBC
HCT: 46.7 % (ref 38.5–50.0)
Hemoglobin: 15.4 g/dL (ref 13.2–17.1)
MCH: 28.7 pg (ref 27.0–33.0)
MCHC: 33 g/dL (ref 32.0–36.0)
MCV: 87.1 fL (ref 80.0–100.0)
MPV: 11.6 fL (ref 7.5–12.5)
Platelets: 212 10*3/uL (ref 140–400)
RBC: 5.36 10*6/uL (ref 4.20–5.80)
RDW: 11.7 % (ref 11.0–15.0)
WBC: 5.7 10*3/uL (ref 3.8–10.8)

## 2020-12-30 LAB — LIPID PANEL
Cholesterol: 242 mg/dL — ABNORMAL HIGH (ref <200)
HDL: 54 mg/dL (ref >=40)
LDL Cholesterol (Calc): 163 mg/dL (calc) — ABNORMAL HIGH
Non-HDL Cholesterol (Calc): 188 mg/dL (calc) — ABNORMAL HIGH (ref <130)
Total CHOL/HDL Ratio: 4.5 (calc) (ref <5.0)
Triglycerides: 123 mg/dL (ref <150)

## 2020-12-30 LAB — COMPLETE METABOLIC PANEL WITH GFR
AG Ratio: 1.7 (calc) (ref 1.0–2.5)
ALT: 10 U/L (ref 9–46)
AST: 13 U/L (ref 10–40)
Albumin: 4.8 g/dL (ref 3.6–5.1)
Alkaline phosphatase (APISO): 67 U/L (ref 36–130)
BUN/Creatinine Ratio: 9 (calc) (ref 6–22)
BUN: 12 mg/dL (ref 7–25)
CO2: 27 mmol/L (ref 20–32)
Calcium: 9.9 mg/dL (ref 8.6–10.3)
Chloride: 101 mmol/L (ref 98–110)
Creat: 1.39 mg/dL — ABNORMAL HIGH (ref 0.60–1.35)
GFR, Est African American: 81 mL/min/{1.73_m2} (ref >=60)
GFR, Est Non African American: 70 mL/min/{1.73_m2} (ref >=60)
Globulin: 2.8 g/dL (calc) (ref 1.9–3.7)
Glucose, Bld: 72 mg/dL (ref 65–99)
Potassium: 4.4 mmol/L (ref 3.5–5.3)
Sodium: 139 mmol/L (ref 135–146)
Total Bilirubin: 0.7 mg/dL (ref 0.2–1.2)
Total Protein: 7.6 g/dL (ref 6.1–8.1)

## 2020-12-30 LAB — VITAMIN D 25 HYDROXY (VIT D DEFICIENCY, FRACTURES): Vit D, 25-Hydroxy: 15 ng/mL — ABNORMAL LOW (ref 30–100)

## 2020-12-30 MED ORDER — ARIPIPRAZOLE 10 MG PO TABS: 10.0000 mg | ORAL_TABLET | Freq: Every day | ORAL | 2 refills | Status: DC

## 2020-12-30 MED ORDER — TRAZODONE HCL 150 MG PO TABS: 150.0000 mg | ORAL_TABLET | Freq: Every evening | ORAL | 0 refills | Status: DC | PRN

## 2020-12-30 MED ORDER — TRAZODONE HCL 150 MG PO TABS
150.0000 mg | ORAL_TABLET | Freq: Every evening | ORAL | 2 refills | Status: DC | PRN
Start: 2020-12-30 — End: 2021-05-12

## 2020-12-30 NOTE — Progress Notes (Signed)
BH MD/PA/NP OP Progress Note Virtual Visit via Telephone Note  I connected with Nathan Holloway on 12/30/20 at 10:30 AM EDT by telephone and verified that I am speaking with the correct person using two identifiers.  Location: Patient: home Provider: Clinic   I discussed the limitations, risks, security and privacy concerns of performing an evaluation and management service by telephone and the availability of in person appointments. I also discussed with the patient that there may be a patient responsible charge related to this service. The patient expressed understanding and agreed to proceed.   I provided 30 minutes of non-face-to-face time during this encounter.   12/30/2020 11:15 AM Nathan Holloway  MRN:  [id number]  Chief Complaint: "Things are okay but I would like to start Abilify"  HPI: 25 year old male seen today for follow up psychiatric evaluation.  He has a psychiatric history of bipolar disorder, schizoaffective disorder, schizophrenia, marijuana use, as well as tobacco use.  He is currently manages on Zyprexa 15 mg daily and trazodone 150 mg nightly. He notes that his medications are effective in managing his psychiatric conditions however notes that he wants to discontinue Zyprexa due to increased weight gain and sedation.   Today was unable to login virtually so his exam was done over the phone. During exam he was pleasant, cooperative, and engaged in conversation. Today he denies anxiety, depression, or psychosis. He notes that over all he has been doing well but notes that he wasn't to discontinue Zyprexa because it is oversedating and making him gain weight. Patient notes that he sleep 10 hours nightly and wakes up tired.   Today provider conducted a GAD-7 and patient scored a 1, at his last visit he scored a 4.  Provider also conducted a PHQ-9 and patient scored a 2, at his last visit he scored a 1. He endorses having an adequate appetite. Today he denies SI/HI/VAH, mania,  or paranoia.   Patient not that he plans to apply for jobs today.   Today he is agreeable to reducing Zyprexa 15mg  to 7.5 mg for a week and then discontinue it. He notes that he has enough Zyprexa at home and is agreeable to cutting his pill in half for a week. He will sten start  Abilify 10 mg.  Provider discussed LAI in the future if he like oral Abilify.  Potential side effects of medication and risks vs benefits of treatment vs non-treatment were explained and discussed. All questions were answered. No other concerns noted at this time.  Visit Diagnosis:    ICD-10-CM   1. Schizoaffective disorder, bipolar type (HCC)  F25.0 ARIPiprazole (ABILIFY) 10 MG tablet    traZODone (DESYREL) 150 MG tablet    Past Psychiatric History: bipolar disorder, schizoaffective disorder, schizophrenia, marijuana use, as well as tobacco use  Past Medical History:  Past Medical History:  Diagnosis Date  . Manic behavior (HCC)   . Psychotic affective disorder (HCC) 08/30/2015   No past surgical history on file.  Family Psychiatric History: Sister dysthymia, maternal great uncle has unknown mental health issues  Family History: No family history on file.  Social History:  Social History   Socioeconomic History  . Marital status: Single    Spouse name: Not on file  . Number of children: Not on file  . Years of education: Not on file  . Highest education level: Not on file  Occupational History  . Not on file  Tobacco Use  . Smoking status: Never Smoker  . Smokeless  tobacco: Never Used  Vaping Use  . Vaping Use: Every day  . Substances: Nicotine, Flavoring  Substance and Sexual Activity  . Alcohol use: No  . Drug use: Not Currently    Types: Marijuana    Comment: recent usage while out of town this week  . Sexual activity: Not Currently  Other Topics Concern  . Not on file  Social History Narrative  . Not on file   Social Determinants of Health   Financial Resource Strain: Low Risk    . Difficulty of Paying Living Expenses: Not very hard  Food Insecurity: No Food Insecurity  . Worried About Programme researcher, broadcasting/film/video in the Last Year: Never true  . Ran Out of Food in the Last Year: Never true  Transportation Needs: No Transportation Needs  . Lack of Transportation (Medical): No  . Lack of Transportation (Non-Medical): No  Physical Activity: Sufficiently Active  . Days of Exercise per Week: 4 days  . Minutes of Exercise per Session: 60 min  Stress: No Stress Concern Present  . Feeling of Stress : Not at all  Social Connections: Socially Isolated  . Frequency of Communication with Friends and Family: More than three times a week  . Frequency of Social Gatherings with Friends and Family: Twice a week  . Attends Religious Services: Never  . Active Member of Clubs or Organizations: No  . Attends Banker Meetings: Never  . Marital Status: Never married    Allergies:  Allergies  Allergen Reactions  . Risperidone And Related Other (See Comments)    Diastatic    Metabolic Disorder Labs: Lab Results  Component Value Date   HGBA1C 4.6 (L) 08/06/2020   MPG 85.32 08/06/2020   MPG 82.45 07/04/2020   Lab Results  Component Value Date   PROLACTIN 3.7 (L) 07/04/2020   Lab Results  Component Value Date   CHOL 189 08/12/2020   TRIG 107 08/12/2020   HDL 62 08/12/2020   CHOLHDL 3.0 08/12/2020   VLDL 21 08/12/2020   LDLCALC 106 (H) 08/12/2020   LDLCALC 105 (H) 07/04/2020   Lab Results  Component Value Date   TSH 0.958 07/04/2020    Therapeutic Level Labs: No results found for: LITHIUM Lab Results  Component Value Date   VALPROATE 35 (L) 08/12/2020   VALPROATE 27 (L) 08/06/2020   No components found for:  CBMZ  Current Medications: Current Outpatient Medications  Medication Sig Dispense Refill  . ARIPiprazole (ABILIFY) 10 MG tablet Take 1 tablet (10 mg total) by mouth daily. 30 tablet 2  . amLODipine (NORVASC) 5 MG tablet Take 1 tablet (5 mg  total) by mouth daily. 30 tablet 0  . traZODone (DESYREL) 150 MG tablet Take 1 tablet (150 mg total) by mouth at bedtime as needed for sleep. 15 tablet 0   No current facility-administered medications for this visit.     Musculoskeletal: Strength & Muscle Tone: Unable to assess due to telephone visit Gait & Station: Unable to assess due to telephone visit Patient leans: N/A  Psychiatric Specialty Exam: Review of Systems  There were no vitals taken for this visit.There is no height or weight on file to calculate BMI.  General Appearance: Unable to assess due to telephone visit  Eye Contact:  Unable to assess due to telephone visit  Speech:  Clear and Coherent and Normal Rate  Volume:  Normal  Mood:  Euthymic  Affect:  Appropriate and Congruent  Thought Process:  Coherent, Goal Directed and Linear  Orientation:  Full (Time, Place, and Person)  Thought Content: WDL and Logical   Suicidal Thoughts:  No  Homicidal Thoughts:  No  Memory:  Immediate;   Good Recent;   Good Remote;   Good  Judgement:  Good  Insight:  Good  Psychomotor Activity:  Unable to assess due to telephone visit  Concentration:  Concentration: Good and Attention Span: Good  Recall:  Good  Fund of Knowledge: Good  Language: Good  Akathisia:  No  Handed:  Right  AIMS (if indicated): not done  Assets:  Communication Skills Desire for Improvement Financial Resources/Insurance Housing Social Support  ADL's:  Intact  Cognition: WNL  Sleep:  Good   Screenings: AIMS   Flowsheet Row [facility] Ad[facility] ([facility]) [facility]  AIMS Total Score 0 0    AUDIT   Flowsheet Row [facility]) [facility] Ad[facility] (Dis[facility]  Alcohol Use Disorder  Identification Test Final Score (AUDIT) 0 0    GAD-7   Flowsheet Row Video Visit from 12/30/2020 in [facility] Clinical Support from 10/06/2020 in [facility] Counselor from 09/18/2020 in [facility]  Total GAD-7 Score 1 4 3     PHQ2-9   Flowsheet Row Video Visit from 12/30/2020 in [facility] Counselor from 12/24/2020 in [facility] Counselor from 11[facility]09/2020 in [facility] Clinical Support from 10/06/2020 in [facility] Counselor from 09/18/2020 in [facility]  PHQ-2 Total Score 0 2 4 1 1   PHQ-9 Total Score 2 8 11 1  --    Flowsheet Row Video Visit from 12/30/2020 in [facility] Clinical Support from 10/06/2020 in [facility] Counselor from 09/18/2020 in [facility]  C-SSRS RISK CATEGORY No Risk No Risk No Risk       Assessment and Plan: Patient notes that his mood is stable and denies anxiety and depression. He notes that he likes Zyprexa however wants to discontinue it due to increased sedation and weight gain.  Today he is agreeable to reducing Zyprexa 15mg  to 7.5 mg for a week and then discontinue it. He notes that he has enough Zyprexa at home and is agreeable to cutting his pill in half for a week. He will sten start  Abilify 10 mg  1. Schizoaffective disorder, bipolar type (HCC)  Start- ARIPiprazole (ABILIFY) 10 MG tablet; Take 1 tablet (10 mg total) by mouth daily.  Dispense: 30 tablet; Refill: 2 Continue- traZODone (DESYREL) 150 MG tablet; Take 1 tablet (150 mg total) by mouth at bedtime as needed for sleep.  Dispense: 30  tablet; Refill: 2  Follow up in 3 months  [facility], NP 12/30/2020, 11:15 AM

## 2021-01-07 ENCOUNTER — Ambulatory Visit (INDEPENDENT_AMBULATORY_CARE_PROVIDER_SITE_OTHER): Payer: No Payment, Other | Admitting: Clinical

## 2021-01-07 ENCOUNTER — Other Ambulatory Visit: Payer: Self-pay

## 2021-01-07 DIAGNOSIS — F250 Schizoaffective disorder, bipolar type: Secondary | ICD-10-CM

## 2021-01-09 NOTE — Progress Notes (Signed)
   THERAPIST PROGRESS NOTE  Session Time: 21 minutes  Participation Level: Active  Behavioral Response: CasualAlertEuthymic  Type of Therapy: Individual Therapy  Treatment Goals addressed: Coping  Interventions: CBT  Summary:  Nathan Holloway is a 25 y.o. male who presents for the scheduled session oriented times five, appropriately dressed, and friendly. Client denied hallucinations and delusions. Client reported on today he is doing very well. Client reported since the last session she has continued to feel increasingly well. Client reported he has maintained well coming out of his depressive episode. Client reported he will be starting a new job at the end of the month in [employer]. Client reported he is glad he will be able to save money. Client reported he want sot learn about investing. Client reported he recently has his abilify changed and is waiting to see how the change will help. Client reported he has noticed his triggers and what he needs to do to maintain. Client reported his sleep is very important for him to get enough sleep.     Suicidal/Homicidal: Nowithout intent/plan  Therapist Response:  Therapist began the session asking how he has been doing since last seen. Therapist used CBT to ask about his thoughts and feelings. Therapist used CBT to use active listening and eye contact. Therapist used CBT to discuss hwo to maintain gains of improved mood. Therapist assigned the client homework to continue working on maintaining his gains of mental stability. Client was scheduled for next appointment.    Plan: Return again in 5 weeks.  Diagnosis: Schizoaffective disorder, bipolar type  Neena Rhymes Oliveah Zwack, LCSW 01/07/2021

## 2021-01-10 NOTE — Progress Notes (Signed)
   THERAPIST PROGRESS NOTE  Session Time: 35 minutes  Participation Level: Active  Behavioral Response: CasualAlertDepressed  Type of Therapy: Individual Therapy  Treatment Goals addressed: Diagnosis: depression  Interventions: CBT  Summary:  Nathan Holloway is a 25 y.o. male who presents for the scheduled session oriented times five, appropriately dressed, and friendly. Client denied hallucinations and delusions. Client reported on today he has been feeling down. Client reported being in a deep depression the last three weeks. Client reported he has taken a break and he is looking for a new job. Client reported he lost interests in his hobbies. Client reported "I have so much potential" but I lost myself for a minute. Client reported his depression included thoughts of "whhat if I wasn't here". Client denied having a plan or intent to hurt himself. Client reported being taken back by those thoughts because he has never thought that way. Client stated, "I'm positive I like people". Client reported to help bring his mood to positive he watches and listens to motivational speeches on youtube.      Suicidal/Homicidal: Nowithout intent/plan  Therapist Response:  Therapist began the session checking in and asking how he has been doing since last seen. Therapist active listened to the clients thoughts and feelings. Therapist used CBT to discuss the severity of depressive thoughts and behaviors. Therapist used CBT to discuss validity of depressive beliefs. Therapist assigned the client to practice self care.    Plan: Return again in 5 weeks.  Diagnosis: Schizoaffective disorder, bipolar type  Neena Rhymes [name], LCSW 12/24/2020

## 2021-02-15 ENCOUNTER — Ambulatory Visit (HOSPITAL_COMMUNITY): Payer: No Payment, Other | Admitting: Clinical

## 2021-02-16 ENCOUNTER — Other Ambulatory Visit (HOSPITAL_COMMUNITY): Payer: Self-pay | Admitting: Psychiatry

## 2021-02-16 ENCOUNTER — Telehealth (HOSPITAL_COMMUNITY): Payer: Self-pay | Admitting: *Deleted

## 2021-02-16 DIAGNOSIS — F250 Schizoaffective disorder, bipolar type: Secondary | ICD-10-CM

## 2021-02-16 NOTE — Telephone Encounter (Signed)
Patient's Mom called x 2  stating patient missed appt due to work. Mom also stated that patient was in Abilify & he had a reaction to & she requested that he resume olanzapine before he breaks care.

## 2021-02-17 ENCOUNTER — Other Ambulatory Visit (HOSPITAL_COMMUNITY): Payer: Self-pay | Admitting: Psychiatry

## 2021-02-17 NOTE — Telephone Encounter (Signed)
Provider spoke to patient's mother.  She notes that the patient had increased nausea while on Abilify.  He discontinued the medication due to noted side effects.  Patient's mother reports that Zyprexa was more effective.  Yesterday Zyprexa refilled and sent to preferred pharmacy.

## 2021-03-02 ENCOUNTER — Ambulatory Visit (HOSPITAL_COMMUNITY): Payer: Self-pay | Admitting: Clinical

## 2021-04-01 ENCOUNTER — Telehealth (HOSPITAL_COMMUNITY): Payer: No Payment, Other | Admitting: Psychiatry

## 2021-05-11 ENCOUNTER — Telehealth (HOSPITAL_COMMUNITY): Payer: Self-pay | Admitting: Psychiatry

## 2021-05-11 NOTE — Telephone Encounter (Signed)
Nathan Holloway (patients legal guardian) contacted office needing to speak with provider about  medications. States patient has a new job and she just needs to speak with provider to make sure patients mental health services and medications are still up to date.

## 2021-05-12 ENCOUNTER — Other Ambulatory Visit (HOSPITAL_COMMUNITY): Payer: Self-pay | Admitting: Psychiatry

## 2021-05-12 DIAGNOSIS — F250 Schizoaffective disorder, bipolar type: Secondary | ICD-10-CM

## 2021-05-12 MED ORDER — OLANZAPINE 15 MG PO TABS: 15.0000 mg | ORAL_TABLET | Freq: Every day | ORAL | 3 refills | Status: DC

## 2021-05-12 MED ORDER — TRAZODONE HCL 150 MG PO TABS: 150.0000 mg | ORAL_TABLET | Freq: Every evening | ORAL | 3 refills | Status: DC | PRN

## 2021-05-12 NOTE — Telephone Encounter (Signed)
Writer spoke to patient and his mother and was informed that overall he is doing well.  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  Patient missed his last appointment because of work and request to reschedule.  Patient rescheduled for December 22.  Medications refilled today and sent to preferred pharmacy.  No other concerns at this time.

## 2021-08-12 ENCOUNTER — Telehealth (HOSPITAL_COMMUNITY): Payer: No Payment, Other | Admitting: Psychiatry

## 2021-11-04 ENCOUNTER — Ambulatory Visit (HOSPITAL_COMMUNITY)
Admission: RE | Admit: 2021-11-04 | Discharge: 2021-11-04 | Disposition: A | Discharge status: Home / Self Care | Payer: 59 | Source: Ambulatory Visit | Attending: Physician Assistant | Admitting: Physician Assistant

## 2021-11-04 ENCOUNTER — Encounter (HOSPITAL_COMMUNITY): Payer: Self-pay

## 2021-11-04 ENCOUNTER — Other Ambulatory Visit: Payer: Self-pay

## 2021-11-04 VITALS — BP 139/77 | HR 65 | Temp 98.4°F | Resp 16

## 2021-11-04 DIAGNOSIS — Z202 Contact with and (suspected) exposure to infections with a predominantly sexual mode of transmission: Secondary | ICD-10-CM | POA: Diagnosis not present

## 2021-11-04 DIAGNOSIS — Z113 Encounter for screening for infections with a predominantly sexual mode of transmission: Secondary | ICD-10-CM | POA: Diagnosis present

## 2021-11-04 LAB — HIV ANTIBODY (ROUTINE TESTING W REFLEX): HIV Screen 4th Generation wRfx: NONREACTIVE

## 2021-11-04 LAB — HEPATITIS C ANTIBODY: HCV Ab: NONREACTIVE

## 2021-11-04 NOTE — ED Provider Notes (Signed)
?[facility] ? ? ? ?CSN: [id number] ?Arrival date & time: 11/04/21  1643 ? ? ?  ? ?History   ?Chief Complaint ?Chief Complaint  ?Patient presents with  ? Appointment  ?  1700  ? STD Check  ? ? ?HPI ?Nathan Holloway is a 26 y.o. male.  ? ?Patient presents today for STI testing.  Reports significant other was positive for chlamydia.  He denies any symptoms including penile discharge, dysuria, pelvic pain, abdominal pain, fever, nausea, vomiting.  Reports he was experiencing some dysuria several months ago but this resolved without intervention.  He does have a history of gonorrhea but was treated and had negative test of cure.  Denies any recent antibiotics.  He is interested in complete STI panel today. ? ? ?Past Medical History:  ?Diagnosis Date  ? Manic behavior (HCC)   ? Psychotic affective disorder (HCC) 08/30/2015  ? ? ?Patient Active Problem List  ? Diagnosis Date Noted  ? Hypertension 08/11/2020  ? High risk medication use 08/11/2020  ? Nicotine abuse 08/11/2020  ? Schizoaffective disorder, bipolar type (HCC) 07/11/2020  ? ? ?History reviewed. No pertinent surgical history. ? ? ? ? ?Home Medications   ? ?Prior to Admission medications   ?Medication Sig Start Date End Date Taking? Authorizing Provider  ?ELDERBERRY PO Take by mouth.   Yes [provider]  ?OLANZapine (ZYPREXA) 15 MG tablet Take 1 tablet (15 mg total) by mouth at bedtime. 05/12/21  Yes Shanna Cisco, NP  ? ? ?Family History ?Family History  ?Problem Relation Age of Onset  ? Healthy Mother   ? ? ?Social History ?Social History  ? ?Tobacco Use  ? Smoking status: Never  ? Smokeless tobacco: Never  ?Vaping Use  ? Vaping Use: Every day  ? Substances: Nicotine, Flavoring  ?Substance Use Topics  ? Alcohol use: No  ? Drug use: Not Currently  ?  Types: Marijuana  ? ? ? ?Allergies   ?Risperidone and related ? ? ?Review of Systems ?Review of Systems  ?Constitutional:  Negative for activity change, appetite change, fatigue and fever.   ?Respiratory:  Negative for cough and shortness of breath.   ?Cardiovascular:  Negative for chest pain.  ?Gastrointestinal:  Negative for abdominal pain, diarrhea, nausea and vomiting.  ?Genitourinary:  Negative for dysuria, frequency, genital sores, penile discharge, penile pain and urgency.  ? ? ?Physical Exam ?Triage Vital Signs ?ED Triage Vitals  ?Enc Vitals Group  ?   BP 11/04/21 1655 139/77  ?   Pulse Rate 11/04/21 1655 65  ?   Resp 11/04/21 1655 16  ?   Temp 11/04/21 1655 98.4 ?F (36.9 ?C)  ?   Temp Source 11/04/21 1655 Oral  ?   SpO2 11/04/21 1655 99 %  ?   Weight --   ?   Height --   ?   Head Circumference --   ?   Peak Flow --   ?   Pain Score 11/04/21 1657 0  ?   Pain Loc --   ?   Pain Edu? --   ?   Excl. in GC? --   ? ?No data found. ? ?Updated Vital Signs ?BP 139/77   Pulse 65   Temp 98.4 ?F (36.9 ?C) (Oral)   Resp 16   SpO2 99%  ? ?Visual Acuity ?Right Eye Distance:   ?Left Eye Distance:   ?Bilateral Distance:   ? ?Right Eye Near:   ?Left Eye Near:    ?Bilateral Near:    ? ?  Physical Exam ?Vitals reviewed.  ?Constitutional:   ?   General: He is awake.  ?   Appearance: Normal appearance. He is well-developed. He is not ill-appearing.  ?   Comments: Very pleasant male appears stated age in no acute distress sitting comfortably in exam room  ?HENT:  ?   Head: Normocephalic and atraumatic.  ?   Mouth/Throat:  ?   Pharynx: No oropharyngeal exudate, posterior oropharyngeal erythema or uvula swelling.  ?Cardiovascular:  ?   Rate and Rhythm: Normal rate and regular rhythm.  ?   Heart sounds: Normal heart sounds, S1 normal and S2 normal. No murmur heard. ?Pulmonary:  ?   Effort: Pulmonary effort is normal.  ?   Breath sounds: Normal breath sounds. No stridor. No wheezing, rhonchi or rales.  ?   Comments: Clear to auscultation bilaterally ?Abdominal:  ?   General: Bowel sounds are normal.  ?   Palpations: Abdomen is soft.  ?   Tenderness: There is no abdominal tenderness.  ?   Comments: Benign abdominal exam   ?Genitourinary: ?   Comments: Exam deferred ?Neurological:  ?   Mental Status: He is alert.  ?Psychiatric:     ?   Behavior: Behavior is cooperative.  ? ? ? ?UC Treatments / Results  ?Labs ?(all labs ordered are listed, but only abnormal results are displayed) ?Labs Reviewed  ?HIV ANTIBODY (ROUTINE TESTING W REFLEX)  ?RPR  ?HEPATITIS C ANTIBODY  ?CYTOLOGY, (ORAL, ANAL, URETHRAL) ANCILLARY ONLY  ? ? ?EKG ? ? ?Radiology ?No results found. ? ?Procedures ?Procedures (including critical care time) ? ?Medications Ordered in UC ?Medications - No data to display ? ?Initial Impression / Assessment and Plan / UC Course  ?I have reviewed the triage vital signs and the nursing notes. ? ?Pertinent labs & imaging results that were available during my care of the patient were reviewed by me and considered in my medical decision making (see chart for details). ? ?  ? ?STI swab and HIV/hepatitis/syphilis testing was obtained today.  Patient is currently asymptomatic so we will defer treatment until results are available.  Discussed the importance of safe sex practices.  He is to abstain from sex until results are available.  Discussed that if he develops any symptoms he should return for reevaluation. ? ?Final Clinical Impressions(s) / UC Diagnoses  ? ?Final diagnoses:  ?Routine screening for STI (sexually transmitted infection)  ?Exposure to chlamydia  ? ? ? ?Discharge Instructions   ? ?  ?We will contact you if anything is positive and we need to arrange treatment.  Abstain from sex and use the results.  Use a condom with each sexual encounter.  If you develop any symptoms please return for reevaluation. ? ? ? ? ?ED Prescriptions   ?None ?  ? ?PDMP not reviewed this encounter. ?  ?Jeani Hawking, PA-C ?11/04/21 1720 ? ?

## 2021-11-04 NOTE — ED Triage Notes (Signed)
Pt denies any current sxs. Reports having had penile discharge approx 1 month ago that resolved without any treatment. Wishes to be checked for STDs. ?

## 2021-11-04 NOTE — Discharge Instructions (Signed)
We will contact you if anything is positive and we need to arrange treatment.  Abstain from sex and use the results.  Use a condom with each sexual encounter.  If you develop any symptoms please return for reevaluation. ?

## 2021-11-05 LAB — RPR: RPR Ser Ql: NONREACTIVE

## 2021-11-05 LAB — CYTOLOGY, (ORAL, ANAL, URETHRAL) ANCILLARY ONLY
Comment: NEGATIVE
Trichomonas: NEGATIVE

## 2021-12-13 ENCOUNTER — Encounter (HOSPITAL_COMMUNITY): Payer: 59 | Admitting: Psychiatry

## 2022-01-24 ENCOUNTER — Encounter (HOSPITAL_COMMUNITY): Payer: 59 | Admitting: Psychiatry

## 2022-02-02 ENCOUNTER — Telehealth (HOSPITAL_COMMUNITY): Payer: Self-pay | Admitting: Psychiatry

## 2022-02-02 NOTE — Telephone Encounter (Signed)
Pt lvm wanting to make an appt & requesting med refills.... after viewing his past appt history this patient has not been seen since May of 22 & has canceled evry appt after that also the upcoming appt with [facility] for July 2023.. I clld pt to have him come in as a walk in but no ans lvm stating we were just returning his call & for him to call us back when he could.

## 2022-02-24 ENCOUNTER — Ambulatory Visit (HOSPITAL_COMMUNITY): Payer: Self-pay | Admitting: Clinical

## 2022-04-07 ENCOUNTER — Ambulatory Visit (HOSPITAL_COMMUNITY)
Admission: EM | Admit: 2022-04-07 | Discharge: 2022-04-08 | Disposition: A | Discharge status: Other Acute Inpatient Hospital | Payer: BC Managed Care – PPO | Attending: Family | Admitting: Family

## 2022-04-07 DIAGNOSIS — F250 Schizoaffective disorder, bipolar type: Secondary | ICD-10-CM | POA: Diagnosis not present

## 2022-04-07 DIAGNOSIS — Z79899 Other long term (current) drug therapy: Secondary | ICD-10-CM | POA: Insufficient documentation

## 2022-04-07 DIAGNOSIS — Z20822 Contact with and (suspected) exposure to covid-19: Secondary | ICD-10-CM | POA: Diagnosis not present

## 2022-04-07 DIAGNOSIS — F319 Bipolar disorder, unspecified: Secondary | ICD-10-CM | POA: Diagnosis not present

## 2022-04-07 LAB — COMPREHENSIVE METABOLIC PANEL WITH GFR
ALT: 16 U/L (ref 0–44)
AST: 20 U/L (ref 15–41)
Albumin: 4.8 g/dL (ref 3.5–5.0)
Alkaline Phosphatase: 64 U/L (ref 38–126)
Anion gap: 7 (ref 5–15)
BUN: 15 mg/dL (ref 6–20)
CO2: 31 mmol/L (ref 22–32)
Calcium: 10.3 mg/dL (ref 8.9–10.3)
Chloride: 105 mmol/L (ref 98–111)
Creatinine, Ser: 1.46 mg/dL — ABNORMAL HIGH (ref 0.61–1.24)
GFR, Estimated: >60 mL/min
Glucose, Bld: 84 mg/dL (ref 70–99)
Potassium: 4 mmol/L (ref 3.5–5.1)
Sodium: 143 mmol/L (ref 135–145)
Total Bilirubin: 1.1 mg/dL (ref 0.3–1.2)
Total Protein: 7.7 g/dL (ref 6.5–8.1)

## 2022-04-07 LAB — CBC WITH DIFFERENTIAL/PLATELET
Abs Immature Granulocytes: 0.01 10*3/uL (ref 0.00–0.07)
Basophils Absolute: 0.1 10*3/uL (ref 0.0–0.1)
Basophils Relative: 1 %
Eosinophils Absolute: 0.1 10*3/uL (ref 0.0–0.5)
Eosinophils Relative: 1 %
HCT: 48.3 % (ref 39.0–52.0)
Hemoglobin: 16.2 g/dL (ref 13.0–17.0)
Immature Granulocytes: 0 %
Lymphocytes Relative: 30 %
Lymphs Abs: 3.1 10*3/uL (ref 0.7–4.0)
MCH: 28.8 pg (ref 26.0–34.0)
MCHC: 33.5 g/dL (ref 30.0–36.0)
MCV: 85.8 fL (ref 80.0–100.0)
Monocytes Absolute: 0.9 10*3/uL (ref 0.1–1.0)
Monocytes Relative: 9 %
Neutro Abs: 6.1 10*3/uL (ref 1.7–7.7)
Neutrophils Relative %: 59 %
Platelets: 252 10*3/uL (ref 150–400)
RBC: 5.63 MIL/uL (ref 4.22–5.81)
RDW: 11.8 % (ref 11.5–15.5)
WBC: 10.3 10*3/uL (ref 4.0–10.5)
nRBC: 0 % (ref 0.0–0.2)

## 2022-04-07 LAB — POCT URINE DRUG SCREEN - MANUAL ENTRY (I-SCREEN)
POC Amphetamine UR: NOT DETECTED
POC Buprenorphine (BUP): NOT DETECTED
POC Cocaine UR: NOT DETECTED
POC Marijuana UR: NOT DETECTED
POC Methadone UR: NOT DETECTED
POC Methamphetamine UR: NOT DETECTED
POC Morphine: NOT DETECTED
POC Oxazepam (BZO): NOT DETECTED
POC Oxycodone UR: NOT DETECTED
POC Secobarbital (BAR): NOT DETECTED

## 2022-04-07 LAB — LIPID PANEL
Cholesterol: 218 mg/dL — ABNORMAL HIGH (ref 0–200)
HDL: 80 mg/dL
LDL Cholesterol: 123 mg/dL — ABNORMAL HIGH (ref 0–99)
Total CHOL/HDL Ratio: 2.7 ratio
Triglycerides: 75 mg/dL
VLDL: 15 mg/dL (ref 0–40)

## 2022-04-07 LAB — RESP PANEL BY RT-PCR (FLU A&B, COVID) ARPGX2
Influenza A by PCR: NEGATIVE
Influenza B by PCR: NEGATIVE
SARS Coronavirus 2 by RT PCR: NEGATIVE

## 2022-04-07 LAB — ETHANOL: Alcohol, Ethyl (B): <10 mg/dL

## 2022-04-07 LAB — HEMOGLOBIN A1C
Hgb A1c MFr Bld: 4.3 % — ABNORMAL LOW (ref 4.8–5.6)
Mean Plasma Glucose: 76.71 mg/dL

## 2022-04-07 LAB — VALPROIC ACID LEVEL: Valproic Acid Lvl: <10 ug/mL — ABNORMAL LOW (ref 50.0–100.0)

## 2022-04-07 LAB — TSH: TSH: 1.405 u[IU]/mL (ref 0.350–4.500)

## 2022-04-07 LAB — MAGNESIUM: Magnesium: 2.1 mg/dL (ref 1.7–2.4)

## 2022-04-07 MED ORDER — DIVALPROEX SODIUM ER 500 MG PO TB24
500.0000 mg | ORAL_TABLET | Freq: Every day | ORAL | Status: DC
  Filled 2022-04-07: qty 1

## 2022-04-07 MED ORDER — OLANZAPINE 15 MG PO TBDP
15.0000 mg | ORAL_TABLET | Freq: Every day | ORAL | Status: DC
  Administered 2022-04-07: 15 mg via ORAL
  Filled 2022-04-07: qty 1

## 2022-04-07 MED ORDER — DIVALPROEX SODIUM 125 MG PO CSDR
250.0000 mg | DELAYED_RELEASE_CAPSULE | Freq: Two times a day (BID) | ORAL | Status: DC
  Administered 2022-04-07 – 2022-04-08 (×2): 250 mg via ORAL
  Filled 2022-04-07 (×2): qty 2

## 2022-04-07 MED ORDER — ACETAMINOPHEN 325 MG PO TABS: 650.0000 mg | ORAL_TABLET | Freq: Four times a day (QID) | ORAL | Status: DC | PRN

## 2022-04-07 MED ORDER — MAGNESIUM HYDROXIDE 400 MG/5ML PO SUSP: 30.0000 mL | Freq: Every day | ORAL | Status: DC | PRN

## 2022-04-07 MED ORDER — TRAZODONE HCL 50 MG PO TABS: 50.0000 mg | ORAL_TABLET | Freq: Every evening | ORAL | Status: DC | PRN

## 2022-04-07 MED ORDER — HYDROXYZINE HCL 25 MG PO TABS
25.0000 mg | ORAL_TABLET | Freq: Three times a day (TID) | ORAL | Status: DC | PRN
  Administered 2022-04-08: 25 mg via ORAL
  Filled 2022-04-07: qty 1

## 2022-04-07 MED ORDER — OLANZAPINE 5 MG PO TBDP: 5.0000 mg | ORAL_TABLET | Freq: Three times a day (TID) | ORAL | Status: DC | PRN

## 2022-04-07 MED ORDER — LORAZEPAM 1 MG PO TABS
1.0000 mg | ORAL_TABLET | ORAL | Status: DC | PRN
Start: 2022-04-07 — End: 2022-04-08

## 2022-04-07 MED ORDER — ALUM & MAG HYDROXIDE-SIMETH 200-200-20 MG/5ML PO SUSP: 30.0000 mL | ORAL | Status: DC | PRN

## 2022-04-07 MED ORDER — ZIPRASIDONE MESYLATE 20 MG IM SOLR: 20.0000 mg | INTRAMUSCULAR | Status: DC | PRN

## 2022-04-07 NOTE — BH Assessment (Signed)
Comprehensive Clinical Assessment (CCA) Note  04/07/2022 Nathan Holloway [id number]  Per Doran Heater, NP, Inpatient treatment is recommended  The patient demonstrates the following risk factors for suicide: Chronic risk factors for suicide include: psychiatric disorder of bipolar disorder . Acute risk factors for suicide include:  none . Protective factors for this patient include: positive social support, positive therapeutic relationship, and hope for the future. Considering these factors, the overall suicide risk at this point appears to be low. Patient is not appropriate for outpatient follow up due to his mania and instability.    AIMS    Flowsheet Row [facility] Ad[facility] ([facility]) [facility]  AIMS Total Score 0 0      AUDIT    Flowsheet Row [facility]) [facility] Ad[facility] (Discharged[facility]  Alcohol Use Disorder Identification Test Final Score (AUDIT) 0 0      GAD-7    Flowsheet Row Video Visit from 12/30/2020 in [facility] Clinical Support from 10/06/2020 in [facility] Counselor from 09/18/2020 in [facility]  Total GAD-7 Score 1 4 3       PHQ2-9    Flowsheet Row ED from 04/07/2022 in [facility] Video Visit from 12/30/2020 in [facility] Counselor from 12/24/2020 in [facility] Counselor from 11/09/2020 in [facility] Clinical Support from 10/06/2020 in [facility]  PHQ-2 Total Score 2 0 2 4 1   PHQ-9 Total Score 9 2 8 11 1       Flowsheet Row ED from 04/07/2022 in [facility] ED from 11/04/2021 in [facility] Video Visit from 12/30/2020 in [facility]  C-SSRS RISK CATEGORY No Risk No Risk No Risk      Chief Complaint:  Chief Complaint  Patient presents with   Hypomanic   Visit Diagnosis:  F25 Schizoaffective Disorder Bipolar Type   CCA Screening, Triage and Referral (STR)  Patient Reported Information How did you hear about 03/01/2021? Family/Friend  What Is the Reason for Your Visit/Call Today? Patient presents to the [facility] with his mother who reports that patient is hypomanic and not himself.  She states that he has been decompensating since he moved into an apartment on his own with his girlfriend.  Patient is diagnosed with bipolar disorder and sees 04-24-2005, but she has been on maturnity leave and he states that he has not seen her recently.  Mother states that she has concerns that patient is not clear on what is reality and what is not at times and he had a meltdown at work today and she states that he called her and asked her to bring him here.  Patient states that he has been taking his medications as prescribed, but mother is not certain of this.  Patient denies current SI/HI.  His speech is a little pressured and rapid. Patient has minimal substance use and just has a glass of wine every now and then. Mother states that she would like to catch this before it gets worse.  Patient is currently paranoid and delusional and thinks that he is a [place] and a [facility] and he is the greatest in the world. He is disorganized.  Patient is tangential and unable to carry on a rational conversation. He was sent home today because of his behavior.    Per Berneice Heinrich, NP, Note: Patient presents voluntarily to [facility] behavioral health for walk-in assessment.  Patient is accompanied by his mother, Marcos Ruelas, who remains present during assessment, as patient prefers.  Patient is assessed, face-to-face, by  nurse practitioner, seated in assessment area, no acute distress.  He  is alert and oriented, pleasant and cooperative during assessment.    Tevion appears paranoid, initially he asked this Clinical research associate "is this conversation recorded for quality assurance?"  He then asked if conversation can remain confidential.  Patient threw away a piece of paper to indicate his locker number, states "that (piece of paper) is a lie detector test, I have a badge but I am not a cop."  Patient presents with rapid and pressured speech.  He presents with anxious mood, labile affect.   Patient intermittently insightful, "I am in an episode and I do not know what is happening, before I could not comprehend manic episodes." Patient reports decreased sleep x several days.   Tabitha endorses delusions.  He states "I am the best rapper in the world, I am the best dancer in the world, and I am the best driver in the world."    Patient's conversation is disorganized and tangential.  Patient states "my grandmother believes I am God but my name is Roel."  Patient goes on "I know I am going to become a millionaire,  I am not rich yet but they can see it in me, I am trapped in the closet, R. Tresa Endo is someone who is a fat rat."  Autry states "Caroleen Hamman  [facility] staff member) is my twin flame, she is the reason I came back."   Jaymarion was diagnosed with bipolar 1 disorder at age 8.  He has also been diagnosed with schizoaffective disorder, bipolar type.  He is currently followed by outpatient psychiatry at [facility] outpatient.  He has been stable on a combination of olanzapine and Depakote in the past.  He was last compliant with his olanzapine approximately 1 month ago.  He was last compliant with his Depakote approximately 1 year ago.  Patient endorses multiple previous inpatient psychiatric hospitalizations.  Most recent hospitalization at [facility] in December 2021. No family mental health history  reported.   Recent stressors include patient's supervisor at work who sent patient home early on yesterday because "patient was talking irrationally."  Per patient's mother,  patient again irrational at work today, resulting in Diplomatic Services operational officer sending Rushi home prior to end of shift again today and called police to patient's mother's home.   He  denies suicidal and homicidal ideations. Denies history of suicide attempts, denies history of non suicidal self-harm behavior.  Patient easily  contracts verbally for safety with this Clinical research associate. He denies auditory and visual hallucinations.     Eriq resides in an apartment with his girlfriend.  He denies access to weapons.  He is employed in Development worker, international aid.  He endorses average appetite.  He endorses recent marijuana use, he is a limited historian regarding marijuana use.  Per his report he recently stopped using marijuana.  Denies alcohol use, denies substance use aside from marijuana.   Patient offered support and encouragement.  He verbalizes understanding of treatment plan to include inpatient psychiatric hospitalization.   Patient's mother, Jahni Nazar, agrees with plan for inpatient psychiatric hospitalization.  She confirms  patient has been stable on a combination of olanzapine and Depakote in the past.  How Long Has This Been Causing You Problems? 1 wk - 1 month  What Do You Feel Would Help You the Most Today? Treatment for Depression or other mood problem   Have You Recently Had Any Thoughts About Hurting Yourself? No  Are You Planning to Commit Suicide/Harm Yourself At This time? No   Have you Recently Had Thoughts About Hurting Someone [name]? No  Are You Planning to Harm Someone at This Time? No  Explanation: No data recorded  Have You Used Any Alcohol or Drugs in the Past 24 Hours? No  How Long Ago Did You Use Drugs or Alcohol? No data recorded What Did You Use and How Much? No data recorded  Do You Currently Have a  Therapist/Psychiatrist? No data recorded Name of Therapist/Psychiatrist: No data recorded  Have You Been Recently Discharged From Any Office Practice or Programs? No data recorded Explanation of Discharge From Practice/Program: No data recorded    CCA Screening Triage Referral Assessment Type of Contact: No data recorded Telemedicine Service Delivery:   Is this Initial or Reassessment? No data recorded Date Telepsych consult ordered in [facility]:  No data recorded Time Telepsych consult ordered in [facility]:  No data recorded Location of Assessment: No data recorded Provider Location: No data recorded  Collateral Involvement: No data recorded  Does Patient Have a Court Appointed Legal Guardian? No data recorded Name and Contact of Legal Guardian: No data recorded If Minor and Not Living with Parent(s), Who has Custody? No data recorded Is CPS involved or ever been involved? No data recorded Is APS involved or ever been involved? No data recorded  Patient Determined To Be At Risk for Harm To Self or Others Based on Review of Patient Reported Information or Presenting Complaint? No data recorded Method: No data recorded Availability of Means: No data recorded Intent: No data recorded Notification Required: No data recorded Additional Information for Danger to Others Potential: No data recorded Additional Comments for Danger to Others Potential: No data recorded Are There Guns or Other Weapons in Your Home? No data recorded Types of Guns/Weapons: No data recorded Are These Weapons Safely Secured?                            No data recorded Who Could Verify You Are Able To Have These Secured: No data recorded Do You Have any Outstanding Charges, Pending Court Dates, Parole/Probation? No data recorded Contacted To Inform of Risk of Harm To Self or Others: No data recorded   Does Patient Present under Involuntary Commitment? No data recorded IVC Papers Initial File Date: No data  recorded  [place] of Residence: No data recorded  Patient Currently Receiving the Following Services: No data recorded  Determination of Need: Routine (7 days)   Options For Referral: Medication Management; Outpatient Therapy; Facility-Based Crisis; BH Urgent Care     CCA Biopsychosocial Patient Reported Schizophrenia/Schizoaffective Diagnosis in Past: Yes   Strengths: self-awareness and seeking help   Mental Health Symptoms Depression:   Increase/decrease in appetite; Weight gain/loss; Difficulty Concentrating; Sleep (too much or little)   Duration of Depressive symptoms:  Duration of Depressive Symptoms: Greater than two weeks   Mania:   Racing thoughts; Change in energy/activity; Increased Energy; Overconfidence; Recklessness; Euphoria; Irritability   Anxiety:    Restlessness   Psychosis:   None   Duration of Psychotic symptoms:  Trauma:   N/A   Obsessions:   None   Compulsions:   None   Inattention:   None   Hyperactivity/Impulsivity:   N/A   Oppositional/Defiant Behaviors:   None   Emotional Irregularity:   None   Other Mood/Personality Symptoms:  No data recorded   Mental Status Exam Appearance and self-care  Stature:   Average   Weight:   Average weight   Clothing:   Age-appropriate   Grooming:   Normal   Cosmetic use:   None   Posture/gait:   Normal   Motor activity:   Not Remarkable   Sensorium  Attention:   Normal   Concentration:   Normal   Orientation:   X5   Recall/memory:   Normal   Affect and Mood  Affect:   Anxious   Mood:   Anxious   Relating  Eye contact:   Normal   Facial expression:   Anxious   Attitude toward examiner:   Cooperative   Thought and Language  Speech flow:  Clear and Coherent   Thought content:   Appropriate to Mood and Circumstances   Preoccupation:   None   Hallucinations:   None   Organization:  No data recorded  Affiliated Computer ServicesExecutive Functions  Fund of Knowledge:    Average   Intelligence:   Average   Abstraction:   Normal   Judgement:   Fair   Reality Testing:   Distorted   Insight:   Poor   Decision Making:   Impulsive   Social Functioning  Social Maturity:   Impulsive   Social Judgement:   Naive   Stress  Stressors:   Transitions; Illness; Relationship   Coping Ability:   Exhausted; Overwhelmed   Skill Deficits:   Self-control; Interpersonal   Supports:   Family; Friends/Service system     Religion: Religion/Spirituality Are You A Religious Person?: Yes What is Your Religious Affiliation?: Non-Denominational  Leisure/Recreation: Leisure / Recreation Do You Have Hobbies?: Yes Leisure and Hobbies: "Anything entertainment" Music, modeling, acting, poetry, dancing, video games  Exercise/Diet: Exercise/Diet Do You Exercise?: Yes What Type of Exercise Do You Do?: Weight Training How Many Times a Week Do You Exercise?: 4-5 times a week Have You Gained or Lost A Significant Amount of Weight in the Past Six Months?: Yes-Lost Number of Pounds Lost?: 40 Do You Follow a Special Diet?: No Do You Have Any Trouble Sleeping?: Yes Explanation of Sleeping Difficulties: sleeping too much pt says   CCA Employment/Education Employment/Work Situation: Employment / Work Situation Employment Situation: Employed Work Stressors: unknown Passenger transport manageratient's Job has Been Impacted by Current Illness: Yes Describe how Patient's Job has Been Impacted: had a Psychologist, counsellingmeltdown at work today Has Patient ever Been in Equities traderthe Military?: No  Education: Education Is Patient Currently Attending School?: No Last Grade Completed: 12 Did You Product managerAttend College?: Yes What Type of College Degree Do you Have?: Pt has not yet completed school 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 [employer] for psychology Did You Have An Individualized Education Program (IIEP): No Did You Have Any Difficulty At School?: No Patient's Education Has Been Impacted by Current Illness:  No   CCA Family/Childhood History Family and Relationship History: Family history Marital status: Single  Childhood History:  Childhood History By whom was/is the patient raised?: Mother Did patient suffer any verbal/emotional/physical/sexual abuse as a child?: No Did patient suffer from severe childhood neglect?: No Has patient ever been sexually abused/assaulted/raped as an adolescent or adult?: No Was  the patient ever a victim of a crime or a disaster?: No Witnessed domestic violence?: No Has patient been affected by domestic violence as an adult?: No  Child/Adolescent Assessment:     CCA Substance Use Alcohol/Drug Use: Alcohol / Drug Use Pain Medications: see MAR Prescriptions: see MAR Over the Counter: see MAR History of alcohol / drug use?: No history of alcohol / drug abuse Longest period of sobriety (when/how long): 1 year                         ASAM's:  Six Dimensions of Multidimensional Assessment  Dimension 1:  Acute Intoxication and/or Withdrawal Potential:      Dimension 2:  Biomedical Conditions and Complications:      Dimension 3:  Emotional, Behavioral, or Cognitive Conditions and Complications:     Dimension 4:  Readiness to Change:     Dimension 5:  Relapse, Continued use, or Continued Problem Potential:     Dimension 6:  Recovery/Living Environment:     ASAM Severity Score:    ASAM Recommended Level of Treatment:     Substance use Disorder (SUD)    Recommendations for Services/Supports/Treatments: Recommendations for Services/Supports/Treatments Recommendations For Services/Supports/Treatments: Inpatient Hospitalization  Discharge Disposition:    DSM5 Diagnoses: Patient Active Problem List   Diagnosis Date Noted   Hypertension 08/11/2020   High risk medication use 08/11/2020   Nicotine abuse 08/11/2020   Schizoaffective disorder, bipolar type (HCC) 07/11/2020     Referrals to Alternative Service(s): Referred to  Alternative Service(s):   Place:   Date:   Time:    Referred to Alternative Service(s):   Place:   Date:   Time:    Referred to Alternative Service(s):   Place:   Date:   Time:    Referred to Alternative Service(s):   Place:   Date:   Time:     Theoren Palka J Seferino Oscar, LCAS

## 2022-04-07 NOTE — Progress Notes (Signed)
Patient presents to the [facility] with his mother who reports that patient is hypomanic and not himself.  She states that he has been decompensating since he moved into an apartment on his own with his girlfriend.  Patient is diagnosed with bipolar disorder and sees [place], but she has been on maturnity leave and he states that he has not seen her recently.  Mother states that she has concerns that patient is not clear on what is reality and what is not at times and he had a meltdown at work today and she states that he called her and asked her to bring him here.  Patient states that he has been taking his medications as prescribed, but mother is not certain of this.  Patient denies current SI/HI.  His speech is a little pressured and rapid. Patient has minimal substance use and just has a glass of wine every now and then. Mother states that she would like to catch this before it gets worse.

## 2022-04-07 NOTE — Progress Notes (Signed)
CSW requested that [facility] Fransico Michael, RN review. CSW will assist and follow.  Maryjean Ka, MSW, [facility] 04/07/2022 10:18 PM
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
## 2022-04-07 NOTE — Discharge Instructions (Signed)
[facility]: offer mobile crisis management.  Their crisis line: [phone]

## 2022-04-08 ENCOUNTER — Inpatient Hospital Stay (HOSPITAL_COMMUNITY)
Admission: AD | Admit: 2022-04-08 | Discharge: 2022-04-14 | DRG: 885 | Disposition: A | Discharge status: Home / Self Care | Payer: BC Managed Care – PPO | Source: Intra-hospital | Attending: Psychiatry | Admitting: Psychiatry

## 2022-04-08 ENCOUNTER — Other Ambulatory Visit: Payer: Self-pay

## 2022-04-08 ENCOUNTER — Encounter (HOSPITAL_COMMUNITY): Payer: Self-pay | Admitting: Family

## 2022-04-08 DIAGNOSIS — G4700 Insomnia, unspecified: Secondary | ICD-10-CM | POA: Diagnosis present

## 2022-04-08 DIAGNOSIS — K30 Functional dyspepsia: Secondary | ICD-10-CM | POA: Diagnosis present

## 2022-04-08 DIAGNOSIS — F250 Schizoaffective disorder, bipolar type: Secondary | ICD-10-CM | POA: Diagnosis not present

## 2022-04-08 DIAGNOSIS — F29 Unspecified psychosis not due to a substance or known physiological condition: Secondary | ICD-10-CM | POA: Diagnosis not present

## 2022-04-08 DIAGNOSIS — R7989 Other specified abnormal findings of blood chemistry: Secondary | ICD-10-CM | POA: Diagnosis not present

## 2022-04-08 DIAGNOSIS — Z79899 Other long term (current) drug therapy: Secondary | ICD-10-CM

## 2022-04-08 DIAGNOSIS — Z20822 Contact with and (suspected) exposure to covid-19: Secondary | ICD-10-CM | POA: Diagnosis not present

## 2022-04-08 DIAGNOSIS — F411 Generalized anxiety disorder: Secondary | ICD-10-CM | POA: Diagnosis present

## 2022-04-08 MED ORDER — HYDROXYZINE HCL 25 MG PO TABS
25.0000 mg | ORAL_TABLET | Freq: Three times a day (TID) | ORAL | Status: DC | PRN
Start: 2022-04-08 — End: 2022-04-14
  Administered 2022-04-09 – 2022-04-13 (×6): 25 mg via ORAL
  Filled 2022-04-08 (×7): qty 1

## 2022-04-08 MED ORDER — OLANZAPINE 15 MG PO TBDP
15.0000 mg | ORAL_TABLET | Freq: Every day | ORAL | Status: DC
  Administered 2022-04-08 – 2022-04-09 (×2): 15 mg via ORAL
  Filled 2022-04-08 (×5): qty 1

## 2022-04-08 MED ORDER — TRAZODONE HCL 50 MG PO TABS
50.0000 mg | ORAL_TABLET | Freq: Every evening | ORAL | Status: DC | PRN
Start: 2022-04-08 — End: 2022-04-10
  Administered 2022-04-08 – 2022-04-09 (×2): 50 mg via ORAL
  Filled 2022-04-08 (×2): qty 1

## 2022-04-08 MED ORDER — MAGNESIUM HYDROXIDE 400 MG/5ML PO SUSP: 30.0000 mL | Freq: Every day | ORAL | Status: DC | PRN

## 2022-04-08 MED ORDER — ACETAMINOPHEN 325 MG PO TABS
650.0000 mg | ORAL_TABLET | Freq: Four times a day (QID) | ORAL | Status: DC | PRN
  Administered 2022-04-09: 650 mg via ORAL
  Filled 2022-04-08: qty 2

## 2022-04-08 MED ORDER — ZIPRASIDONE MESYLATE 20 MG IM SOLR
20.0000 mg | INTRAMUSCULAR | Status: AC | PRN
Start: 2022-04-08 — End: 2022-04-11
  Administered 2022-04-11: 20 mg via INTRAMUSCULAR
  Filled 2022-04-08: qty 20

## 2022-04-08 MED ORDER — DIVALPROEX SODIUM 125 MG PO CSDR
250.0000 mg | DELAYED_RELEASE_CAPSULE | Freq: Two times a day (BID) | ORAL | Status: DC
  Administered 2022-04-08: 250 mg via ORAL
  Filled 2022-04-08 (×4): qty 2

## 2022-04-08 MED ORDER — LORAZEPAM 1 MG PO TABS
1.0000 mg | ORAL_TABLET | ORAL | Status: DC | PRN
Start: 2022-04-08 — End: 2022-04-14
  Filled 2022-04-08 (×2): qty 1

## 2022-04-08 MED ORDER — OLANZAPINE 5 MG PO TBDP
5.0000 mg | ORAL_TABLET | Freq: Three times a day (TID) | ORAL | Status: DC | PRN
Start: 2022-04-08 — End: 2022-04-14
  Filled 2022-04-08 (×2): qty 1

## 2022-04-08 MED ORDER — ALUM & MAG HYDROXIDE-SIMETH 200-200-20 MG/5ML PO SUSP: 30.0000 mL | ORAL | Status: DC | PRN

## 2022-04-08 NOTE — Progress Notes (Signed)
Adult Psychoeducational Group Note  Date:  04/08/2022 Time:  8:43 PM  Group Topic/Focus:  Wrap-Up Group:   The focus of this group is to help patients review their daily goal of treatment and discuss progress on daily workbooks.  Participation Level:  Active  Participation Quality:  Appropriate  Affect:  Appropriate  Cognitive:  Appropriate  Insight: Appropriate  Engagement in Group:  Engaged  Modes of Intervention:  Discussion  Additional Comments:   Pt actively participated in the Wrap Up group session. Pt did not rate his day. "Every thing is amazing, call me Steffanie Rainwater". Pt was receptive to redirection. Pt identified increase self awareness and better understanding  of his mental health diagnosis  and personal symptoms indicative of "mental breakdown".  Edmund Hilda Tonye Tancredi 04/08/2022, 8:43 PM

## 2022-04-08 NOTE — Progress Notes (Signed)
   04/08/22 2000  Psych Admission Type (Psych Patients Only)  Admission Status Involuntary  Psychosocial Assessment  Patient Complaints Anxiety;Hyperactivity  Eye Contact Glaring  Facial Expression Flat  Affect Anxious  Speech Slow  Interaction Assertive  Motor Activity Pacing;Restless  Appearance/Hygiene Disheveled  Behavior Characteristics Cooperative  Mood Anxious;Labile  Aggressive Behavior  Effect No apparent injury  Thought Process  Coherency Loose associations;Disorganized  Content Magical thinking  Delusions Religious;Paranoid  Perception Derealization  Hallucination None reported or observed  Judgment Impaired  Confusion Mild  Danger to Self  Current suicidal ideation? Denies  Danger to Others  Danger to Others None reported or observed

## 2022-04-08 NOTE — ED Provider Notes (Signed)
FBC/OBS ASAP Discharge Summary  Date and Time: 04/08/2022 1:09 PM  Name: Nathan Holloway  MRN:  [id number]   Discharge Diagnoses:  Final diagnoses:  Schizoaffective disorder, bipolar type (HCC)    Subjective: Patient states "I just want to get out of here but I want to stay here as long as I can."  Nathan Holloway is seated in observation area upon my approach.  No apparent distress.  He is alert and oriented, pleasant and cooperative during assessment.  He presents with anxious mood, congruent affect.  Nathan Holloway continues to exhibit tangential conversation.  He states "[birth year] is not my birthday, I do not like that birthday anymore, I do not want that birthday I want Christmas or August [birth year]."  Patient conversation continues to be disorganized.  He shares "bipolar disorder is incredibly rare, no celebrities have bipolar disorder but I think Nathan Holloway might have bipolar disorder."  Patient endorses some paranoia surrounding medications.  He reports "Zyprexa has made me bloated and that turned into fat, I do not want to be fat." He continues to deny suicidal and homicidal ideations.  He continues to deny auditory and visual hallucinations.  He endorses average sleep and appetite while at [facility] behavioral health.  Patient offered support and encouragement.  Reviewed treatment plan to include inpatient psychiatric admission at [facility] behavioral health.  Patient verbalizes agreement and understanding.  Patient gives verbal consent to speak with his mother, Nathan Holloway.  Spoke with patient's mother, Nathan Holloway phone number [phone].  Reviewed treatment plan, patient's mother verbalized understanding.  It has been documented that patient's mother is his legal guardian, she makes this Clinical research associate aware that she is his caregiver however she has not been designated as a Armed forces operational officer guardian.  She has considered seeking legal guardianship and may do so in the future.  Stay Summary: HPI from 04/07/2022 at 1913pm: :  Patient presents voluntarily to [facility] behavioral health for walk-in assessment.  Patient is accompanied by his mother, Nathan Holloway, who remains present during assessment, as patient prefers.  Patient is assessed, face-to-face, by nurse practitioner, seated in assessment area, no acute distress.  He  is alert and oriented, pleasant and cooperative during assessment.    Nathan Holloway appears paranoid, initially he asked this Clinical research associate "is this conversation recorded for quality assurance?"  He then asked if conversation can remain confidential.  Patient threw away a piece of paper to indicate his locker number, states "that (piece of paper) is a lie detector test, I have a badge but I am not a cop."  Patient presents with rapid and pressured speech.  He presents with anxious mood, labile affect.   Patient intermittently insightful, "I am in an episode and I do not know what is happening, before I could not comprehend manic episodes." Patient reports decreased sleep x several days.   Nathan Holloway endorses delusions.  He states "I am the best rapper in the world, I am the best dancer in the world, and I am the best driver in the world."    Patient's conversation is disorganized and tangential.  Patient states "my grandmother believes I am God but my name is Nathan Holloway."  Patient goes on "I know I am going to become a millionaire,  I am not rich yet but they can see it in me, I am trapped in the closet, Nathan Holloway is someone who is a fat rat."  Nathan Holloway states "Nathan Holloway  [facility] staff member) is my twin flame, she is the reason I came back."  Nathan Holloway was diagnosed with bipolar 1 disorder at age 26.  He has also been diagnosed with schizoaffective disorder, bipolar type.  He is currently followed by outpatient psychiatry at [facility] behavioral health outpatient.  He has been stable on a combination of olanzapine and Depakote in the past.  He was last compliant with his olanzapine approximately 1 month ago.  He was last  compliant with his Depakote approximately 1 year ago.  Patient endorses multiple previous inpatient psychiatric hospitalizations.  Most recent hospitalization at [facility] in December 2021. No family mental health history reported.   Recent stressors include patient's supervisor at work who sent patient home early on yesterday because "patient was talking irrationally."  Per patient's mother,  patient again irrational at work today, resulting in Diplomatic Services operational officer sending Nathan Holloway home prior to end of shift again today and called police to patient's mother's home.   He  denies suicidal and homicidal ideations. Denies history of suicide attempts, denies history of non suicidal self-harm behavior.  Patient easily  contracts verbally for safety with this Clinical research associate. He denies auditory and visual hallucinations.     Nathan Holloway resides in an apartment with his girlfriend.  He denies access to weapons.  He is employed in Development worker, international aid.  He endorses average appetite.  He endorses recent marijuana use, he is a limited historian regarding marijuana use.  Per his report he recently stopped using marijuana.  Denies alcohol use, denies substance use aside from marijuana.   Patient offered support and encouragement.  He verbalizes understanding of treatment plan to include inpatient psychiatric hospitalization. Upon nursing attempting to collect labs and assess vital signs [name] threatens physical violence, states "I will drop kick somebody."    Patient's mother, Nathan Holloway, agrees with plan for inpatient psychiatric hospitalization.  She confirms patient has been stable on a combination of olanzapine and Depakote in the past.     Total Time spent with patient: 30 minutes  Past Psychiatric History: Affective disorder, bipolar type, bipolar 1 disorder Past Medical History:  Past Medical History:  Diagnosis Date   Manic behavior (HCC)    Psychotic affective disorder (HCC) 08/30/2015   No past  surgical history on file. Family History:  Family History  Problem Relation Age of Onset   Healthy Mother    Family Psychiatric History: None reported Social History:  Social History   Substance and Sexual Activity  Alcohol Use No     Social History   Substance and Sexual Activity  Drug Use Not Currently   Types: Marijuana    Social History   Socioeconomic History   Marital status: Single    Spouse name: Not on file   Number of children: Not on file   Years of education: Not on file   Highest education level: Not on file  Occupational History   Not on file  Tobacco Use   Smoking status: Never   Smokeless tobacco: Never  Vaping Use   Vaping Use: Every day   Substances: Nicotine, Flavoring  Substance and Sexual Activity   Alcohol use: No   Drug use: Not Currently    Types: Marijuana   Sexual activity: Yes  Other Topics Concern   Not on file  Social History Narrative   Not on file   Social Determinants of Health   Financial Resource Strain: Low Risk  (09/18/2020)   Overall Financial Resource Strain (CARDIA)    Difficulty of Paying Living Expenses: Not very hard  Food Insecurity: No  Food Insecurity (09/18/2020)   Hunger Vital Sign    Worried About Running Out of Food in the Last Year: Never true    Ran Out of Food in the Last Year: Never true  Transportation Needs: No Transportation Needs (09/18/2020)   PRAPARE - Administrator, Civil ServiceTransportation    Lack of Transportation (Medical): No    Lack of Transportation (Non-Medical): No  Physical Activity: Sufficiently Active (09/18/2020)   Exercise Vital Sign    Days of Exercise per Week: 4 days    Minutes of Exercise per Session: 60 min  Stress: No Stress Concern Present (09/18/2020)   [employer] - Occupational Stress Questionnaire    Feeling of Stress : Not at all  Social Connections: Socially Isolated (09/18/2020)   Social Connection and Isolation Panel [NHANES]    Frequency of Communication with  Friends and Family: More than three times a week    Frequency of Social Gatherings with Friends and Family: Twice a week    Attends Religious Services: Never    Database administratorActive Member of Clubs or Organizations: No    Attends BankerClub or Organization Meetings: Never    Marital Status: Never married   SDOH:  SDOH Screenings   Alcohol Screen: Low Risk  (08/08/2020)   Alcohol Screen    Last Alcohol Screening Score (AUDIT): 0  Depression (PHQ2-9): Medium Risk (04/07/2022)   Depression (PHQ2-9)    PHQ-2 Score: 9  Financial Resource Strain: Low Risk  (09/18/2020)   Overall Financial Resource Strain (CARDIA)    Difficulty of Paying Living Expenses: Not very hard  Food Insecurity: No Food Insecurity (09/18/2020)   Hunger Vital Sign    Worried About Running Out of Food in the Last Year: Never true    Ran Out of Food in the Last Year: Never true  Housing: Low Risk  (09/18/2020)   Housing    Last Housing Risk Score: 0  Physical Activity: Sufficiently Active (09/18/2020)   Exercise Vital Sign    Days of Exercise per Week: 4 days    Minutes of Exercise per Session: 60 min  Social Connections: Socially Isolated (09/18/2020)   Social Connection and Isolation Panel [NHANES]    Frequency of Communication with Friends and Family: More than three times a week    Frequency of Social Gatherings with Friends and Family: Twice a week    Attends Religious Services: Never    Database administratorActive Member of Clubs or Organizations: No    Attends BankerClub or Organization Meetings: Never    Marital Status: Never married  Stress: No Stress Concern Present (09/18/2020)   [employer] - Occupational Stress Questionnaire    Feeling of Stress : Not at all  Tobacco Use: Low Risk  (11/04/2021)   Patient History    Smoking Tobacco Use: Never    Smokeless Tobacco Use: Never    Passive Exposure: Not on file  Transportation Needs: No Transportation Needs (09/18/2020)   PRAPARE - Transportation    Lack of Transportation  (Medical): No    Lack of Transportation (Non-Medical): No    Tobacco Cessation:  N/A, patient does not currently use tobacco products  Current Medications:  Current Facility-Administered Medications  Medication Dose Route Frequency Provider Last Rate Last Admin   acetaminophen (TYLENOL) tablet 650 mg  650 mg Oral Q6H PRN Lenard LanceAllen, Anam Bobby L, FNP       alum & mag hydroxide-simeth (MAALOX/MYLANTA) 200-200-20 MG/5ML suspension 30 mL  30 mL Oral Q4H PRN Lenard LanceAllen, Ondra Deboard L, FNP  divalproex (DEPAKOTE SPRINKLE) capsule 250 mg  250 mg Oral Q12H Onuoha, Chinwendu V, NP   250 mg at 04/08/22 4034   hydrOXYzine (ATARAX) tablet 25 mg  25 mg Oral TID PRN Lenard Lance, FNP       OLANZapine zydis (ZYPREXA) disintegrating tablet 5 mg  5 mg Oral Q8H PRN Lenard Lance, FNP       And   LORazepam (ATIVAN) tablet 1 mg  1 mg Oral PRN Lenard Lance, FNP       And   ziprasidone (GEODON) injection 20 mg  20 mg Intramuscular PRN Lenard Lance, FNP       magnesium hydroxide (MILK OF MAGNESIA) suspension 30 mL  30 mL Oral Daily PRN Lenard Lance, FNP       OLANZapine zydis (ZYPREXA) disintegrating tablet 15 mg  15 mg Oral QHS Lenard Lance, FNP   15 mg at 04/07/22 2017   traZODone (DESYREL) tablet 50 mg  50 mg Oral QHS PRN Lenard Lance, FNP       Current Outpatient Medications  Medication Sig Dispense Refill   OLANZapine (ZYPREXA) 15 MG tablet Take 1 tablet (15 mg total) by mouth at bedtime. (Patient taking differently: Take 7.5 mg by mouth at bedtime.) 30 tablet 3    PTA Medications: (Not in a hospital admission)      04/07/2022    7:28 PM 12/30/2020   10:39 AM 12/24/2020    3:32 PM  Depression screen PHQ 2/9  Decreased Interest 0 0 1  Down, Depressed, Hopeless 2 0 1  PHQ - 2 Score 2 0 2  Altered sleeping 2 1 3   Tired, decreased energy 0 1 1  Change in appetite 2 0 1  Feeling bad or failure about yourself  0 0 0  Trouble concentrating 1 0 1  Moving slowly or fidgety/restless 2 0 0  Suicidal thoughts 0 0 0   PHQ-9 Score 9 2 8   Difficult doing work/chores Very difficult  Somewhat difficult    Flowsheet Row ED from 04/07/2022 in [facility] ED from 11/04/2021 in [facility] at [facility] Video Visit from 12/30/2020 in [facility]  C-SSRS RISK CATEGORY No Risk No Risk No Risk       Musculoskeletal  Strength & Muscle Tone: within normal limits Gait & Station: normal Patient leans: N/A  Psychiatric Specialty Exam  Presentation  General Appearance: Appropriate for Environment; Casual  Eye Contact:Good  Speech:Pressured  Speech Volume:Normal  Handedness:Right   Mood and Affect  Mood:Anxious  Affect:Labile   Thought Process  Thought Processes:Disorganized  Descriptions of Associations:Tangential  Orientation:Full (Time, Place and Person)  Thought Content:Tangential  Diagnosis of Schizophrenia or Schizoaffective disorder in past: Yes    Hallucinations:Hallucinations: None  Ideas of Reference:Delusions  Suicidal Thoughts:Suicidal Thoughts: No  Homicidal Thoughts:Homicidal Thoughts: No   Sensorium  Memory:Immediate Good  Judgment:Impaired  Insight:Lacking   Executive Functions  Concentration:Fair  Attention Span:Poor  Recall:Good  Fund of Knowledge:Good  Language:Good   Psychomotor Activity  Psychomotor Activity:Psychomotor Activity: Normal   Assets  Assets:Communication Skills; Desire for Improvement; Financial Resources/Insurance; Housing; Leisure Time; Intimacy; Social Support; Resilience; Physical Health; Talents/Skills   Sleep  Sleep:Sleep: Fair   Nutritional Assessment (For OBS and FBC admissions only) Has the patient had a weight loss or gain of 10 pounds or more in the last 3 months?: No Has the patient had a decrease in food intake/or appetite?: No Does the  patient have dental problems?: No Does the patient have eating habits or behaviors that may be indicators of an  eating disorder including binging or inducing vomiting?: No Has the patient recently lost weight without trying?: 0 Has the patient been eating poorly because of a decreased appetite?: 0 Malnutrition Screening Tool Score: 0    Physical Exam  Physical Exam Vitals and nursing note reviewed.  Constitutional:      Appearance: Normal appearance. He is well-developed and normal weight.  HENT:     Head: Normocephalic and atraumatic.     Nose: Nose normal.  Cardiovascular:     Rate and Rhythm: Normal rate.  Pulmonary:     Effort: Pulmonary effort is normal.  Skin:    General: Skin is warm and dry.  Neurological:     Mental Status: He is alert and oriented to person, place, and time.  Psychiatric:        Attention and Perception: Attention and perception normal.        Mood and Affect: Mood is anxious. Affect is labile.        Speech: Speech is rapid and pressured and tangential.        Behavior: Behavior is cooperative.        Thought Content: Thought content is paranoid and delusional.        Cognition and Memory: Cognition normal.    Review of Systems  Constitutional: Negative.   HENT: Negative.    Eyes: Negative.   Respiratory: Negative.    Cardiovascular: Negative.   Gastrointestinal: Negative.   Genitourinary: Negative.   Musculoskeletal: Negative.   Skin: Negative.   Neurological: Negative.   Psychiatric/Behavioral:  The patient is nervous/anxious.    Blood pressure 136/81, pulse 87, temperature 97.6 F (36.4 C), temperature source Oral, resp. rate 18, SpO2 100 %. There is no height or weight on file to calculate BMI.  Demographic Factors:  Male and Adolescent or young adult  Loss Factors: NA  Historical Factors: NA  Risk Reduction Factors:   Sense of responsibility to family, Living with another person, especially a relative, Positive social support, Positive therapeutic relationship, and Positive coping skills or problem solving skills  Continued Clinical  Symptoms:  Previous Psychiatric Diagnoses and Treatments  Cognitive Features That Contribute To Risk:  None    Suicide Risk:    Plan Of Care/Follow-up recommendations:  Patient reviewed with Dr. Nelly Rout.  He remains under involuntary commitment petition and has been accepted to [facility] behavioral health for inpatient psychiatric treatment.  Disposition: Admission to [facility] behavioral health, accepted by Dr. Sherron Flemings.  Lenard Lance, FNP 04/08/2022, 1:09 PM

## 2022-04-08 NOTE — Progress Notes (Signed)
Patient arrived at 1600. Pt is alert. Reports that his mother brought him because he got into an argument with his significant other.Patient reports he lives with his girlfriend.  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 States that he is still recovering from the death of his father who he says passed away in 12/05/2006. Patient is also hyper-religious. States that he is both God and the devil. Patient made the comment that he has died three times and returned to save the world. However, patient denies SI/HI/AVH. Patient denies drug and tobacco use. Reports drinking wine on the weekends. Patient reported taking Zyprexa at home but doesn't report taking any other medications. Patient oriented to the unit.

## 2022-04-08 NOTE — ED Notes (Signed)
Ledford was found siting in a chair next to the telephone asleep he was easily awakened and was complaint with staff request to return to bed.

## 2022-04-08 NOTE — ED Notes (Signed)
Report called to [facility].  Pt is expected around 1600.  Will call [facility] transport to take pt to [facility].

## 2022-04-08 NOTE — Progress Notes (Signed)
Pt hyper active, pt trying not to relax, pt took HS medication, but trying to take multiple showers , pt needing redirection

## 2022-04-08 NOTE — BH Assessment (Addendum)
Doran Heater, NP, recommends inpatient psychiatric treatment. Patient accepted to [facility], per [place] [facility] Lorin Glass, RN). The room assignment is 503-1. Patient may transfer at 1600. Patient's nurse at the [facility] Idalia Needle, LPN), provided disposition updates.

## 2022-04-08 NOTE — ED Notes (Signed)
PT Aox4, awake and sitting in recliner chair.  Pt speaks rapidly and jumps from one topic to another.  Denies SI, HI, pain and AVH.    Pt reports good sleep overnight and states his mood is "loved" this morning.  Since being in observation pt stated he has spoken with his gf, mother, and grandma.  All of the individuals have provided positive responses to pt seeking mental health assistance.  Pt reports he would like to discharge and needs a therapist.  Breathing is even and unlabored.  Will continue to monitor for safety.

## 2022-04-08 NOTE — Progress Notes (Signed)
Inpatient Behavioral Health Placement  Meets inpatient criteria per Doran Heater, FNP. There are no available beds at [facility] per [facility] Fransico Michael, RN.Referral was sent to the following facilities;    Destination Service Provider Address Phone Fax  [facility]  [address] [place], [place] [address] [phone]  [facility]  [address] [place] [address] [phone]  [facility]  [address] [place], [place] [address] [phone]  [facility]  [address] [place] [address] [phone]  [facility]  [address] [place] [address] [phone]  [facility]  [address], [place] [address] [phone]  [facility]  [address] [place] [address] [phone]  [facility]  [address] [place] [address] [phone]  [facility]  [address], [place] [address] [phone]  [facility]  [address] [place] [address] [phone]  [facility]  [address] [place] [address] [phone]   Situation ongoing,  CSW will follow up.   Maryjean Ka, MSW, LCSWA 04/08/2022  @ 12:18 AM

## 2022-04-08 NOTE — ED Notes (Signed)
Pt asleep at this hour. No apparent distress. RR even and unlabored. Monitored for safety.  

## 2022-04-09 DIAGNOSIS — G4700 Insomnia, unspecified: Secondary | ICD-10-CM

## 2022-04-09 DIAGNOSIS — F411 Generalized anxiety disorder: Secondary | ICD-10-CM

## 2022-04-09 LAB — PROLACTIN: Prolactin: 11.2 ng/mL (ref 4.0–15.2)

## 2022-04-09 MED ORDER — LORAZEPAM 1 MG PO TABS
1.0000 mg | ORAL_TABLET | Freq: Two times a day (BID) | ORAL | Status: DC
  Administered 2022-04-09 – 2022-04-10 (×3): 1 mg via ORAL
  Filled 2022-04-09 (×3): qty 1

## 2022-04-09 MED ORDER — VALPROIC ACID 250 MG/5ML PO SOLN
500.0000 mg | Freq: Two times a day (BID) | ORAL | Status: DC
  Administered 2022-04-09 – 2022-04-14 (×11): 500 mg via ORAL
  Filled 2022-04-09 (×15): qty 10

## 2022-04-09 MED ORDER — OLANZAPINE 10 MG PO TBDP
10.0000 mg | ORAL_TABLET | Freq: Every day | ORAL | Status: DC
  Administered 2022-04-10: 10 mg via ORAL
  Filled 2022-04-09 (×4): qty 1

## 2022-04-09 MED ORDER — VALPROIC ACID 250 MG/5ML PO SOLN
250.0000 mg | Freq: Two times a day (BID) | ORAL | Status: DC
  Filled 2022-04-09: qty 5

## 2022-04-09 MED ORDER — BENZTROPINE MESYLATE 1 MG/ML IJ SOLN: 1.0000 mg | Freq: Two times a day (BID) | INTRAMUSCULAR | Status: DC | PRN

## 2022-04-09 MED ORDER — OLANZAPINE 5 MG PO TBDP
5.0000 mg | ORAL_TABLET | Freq: Once | ORAL | Status: AC
  Administered 2022-04-09: 5 mg via ORAL
  Filled 2022-04-09 (×2): qty 1

## 2022-04-09 MED ORDER — BENZTROPINE MESYLATE 1 MG PO TABS: 1.0000 mg | ORAL_TABLET | Freq: Two times a day (BID) | ORAL | Status: DC | PRN

## 2022-04-09 NOTE — Group Note (Signed)
  [facility]/BMU LCSW Group Therapy Note  Date/Time:  04/09/2022   Type of Therapy and Topic:  Group Therapy:  Feelings About Hospitalization  Participation Level:  Active   Description of Group This process group involved patients discussing their feelings related to being hospitalized, as well as the benefits they see to being in the hospital.  These feelings and benefits were itemized.  The group then brainstormed specific ways in which they could seek those same benefits when they discharge and return home.  Therapeutic Goals Patient will identify and describe positive and negative feelings related to hospitalization Patient will verbalize benefits of hospitalization to themselves personally Patients will brainstorm together ways they can obtain similar benefits in the outpatient setting, identify barriers to wellness and possible solutions  Summary of Patient Progress:  The patient expressed their primary feelings about being hospitalized are "I want to know why I'm in here." Pt was disruptive to the group, telling CSW "you look nervous, I think you choose the wrong profession" and then challenged CSW to come closer to pt, and when CSW stated they were comfortable sitting where they were, pt got up and punched the air around CSW. Group was dismissed early due to disruption.  Therapeutic Modalities Cognitive Behavioral Therapy Motivational Interviewing    [place], [place] 04/09/2022, 11:42 AM

## 2022-04-09 NOTE — Progress Notes (Signed)
Pt observed making threats towards staff, being sexually inappropriate with male staff, and continues to be hard to redirect.
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
## 2022-04-09 NOTE — Progress Notes (Signed)
   04/09/22 0515  Sleep  Number of Hours 5

## 2022-04-09 NOTE — Progress Notes (Signed)
   04/09/22 1100  Psych Admission Type (Psych Patients Only)  Admission Status Involuntary  Psychosocial Assessment  Patient Complaints Agitation;Anxiety;Hyperactivity  Eye Contact Glaring  Facial Expression Flat  Affect Anxious  Speech Rapid;Pressured  Interaction Assertive  Motor Activity Pacing;Restless  Appearance/Hygiene In hospital gown  Behavior Characteristics Cooperative  Mood Labile;Anxious  Aggressive Behavior  Effect No apparent injury  Thought Process  Coherency Loose associations;Flight of ideas;Disorganized  Content Magical thinking;Religiosity;Delusions;Preoccupation  Delusions Grandeur;Religious;Paranoid  Perception Derealization  Hallucination None reported or observed  Judgment Impaired  Confusion Mild  Danger to Self  Current suicidal ideation? Denies  Agreement Not to Harm Self Yes  Description of Agreement Verbal  Danger to Others  Danger to Others None reported or observed

## 2022-04-09 NOTE — BHH Counselor (Signed)
Adult Comprehensive Assessment  Patient ID: Nathan Holloway, male   DOB: [birth year], 26 y.o.   MRN: [id number]  Information Source: Information source: Patient  Current Stressors:  Patient states their primary concerns and needs for treatment are:: "Getting into an episode, caught it before it got too bad." Patient states their goals for this hospitilization and ongoing recovery are:: "Get better.  I was in a manic episode." Educational / Learning stressors: Denies stressors Employment / Job issues: Thinks he may have lost his job.  Gets anxious at work, will cry in front of his Production designer, theatre/television/film. Family Relationships: Mom "doesn't believe in me." Financial / Lack of resources (include bankruptcy): Lives paycheck to paycheck, wants to "get ahead of the game." Housing / Lack of housing: Denies stressors Physical health (include injuries & life threatening diseases): Foot hurts.  States he is allergic to a lot of substances such as medicines, foods, objects such as pencils. Social relationships: "They're all amazing." Substance abuse: Denies stressors Bereavement / Loss: Father died in Dec 20, 2006.  Living/Environment/Situation:  Living Arrangements: Spouse/significant other Living conditions (as described by patient or guardian): Good Who else lives in the home?: Girlfriend How long has patient lived in current situation?: 1 month What is atmosphere in current home: Chaotic  Family History:  Marital status: Long term relationship Long term relationship, how long?: 1 year What types of issues is patient dealing with in the relationship?: Tried to propose to her, but she missed every hint.  States he is not breaking up with her but "taking a break." Are you sexually active?: Yes What is your sexual orientation?: Heterosexual  Childhood History:  By whom was/is the patient raised?: Mother Additional childhood history information: Father passed away when he was 31 Description of patient's relationship with  caregiver when they were a child: good Patient's description of current relationship with people who raised him/her: Mother "doesn't believe in me." How were you disciplined when you got in trouble as a child/adolescent?: pt states he really did not cause trouble as kid Does patient have siblings?: Yes Number of Siblings: 2 Description of patient's current relationship with siblings: younger sister, older half-brother - good relationships Did patient suffer any verbal/emotional/physical/sexual abuse as a child?: No Did patient suffer from severe childhood neglect?: No Has patient ever been sexually abused/assaulted/raped as an adolescent or adult?: No Was the patient ever a victim of a crime or a disaster?: No Witnessed domestic violence?: No Has patient been affected by domestic violence as an adult?: No  Education:  Highest grade of school patient has completed: Some Scientist, product/process development school and some college Currently a Consulting civil engineer?: No Learning disability?: No  Employment/Work Situation:   Employment Situation: Employed Where is Patient Currently Employed?: Scientist, forensic How Long has Patient Been Employed?: Since 12/20/2021 Are You Satisfied With Your Job?: No Do You Work More Than One Job?: No Work Stressors: Clinical biochemist is very difficult Patient's Job has Been Impacted by Current Illness: Yes Describe how Patient's Job has Been Impacted: had a Psychologist, counselling at work today What is the [employer] Time Patient has Held a Job?: [employer] Where was the Patient Employed at that Time?: 1 year Has Patient ever Been in the [employer]?: No  Financial Resources:   Financial resources: Income from employment, Private insurance Does patient have a representative payee or guardian?: No  Alcohol/Substance Abuse:   What has been your use of drugs/alcohol within the last 12 months?: Does not remember the last time he smoked weed because it now makes  him paranoid.  CBD gummies and melatonin gummies  currently used.  Social drinking. Alcohol/Substance Abuse Treatment Hx: Denies past history Has alcohol/substance abuse ever caused legal problems?: No  Social Support System:   Conservation officer, nature Support System: Production assistant, radio System: mother, brother, god-brother, best friend, Emergency planning/management officer, god-brother's girlfriend, grandfather Type of faith/religion: Would prefer not to answer How does patient's faith help to cope with current illness?: Believes in [address], "God has been blessing me."  Leisure/Recreation:   Do You Have Hobbies?: Yes Leisure and Hobbies: "Anything entertainment" Music, modeling, acting, poetry, dancing, video games  Strengths/Needs:   What is the patient's perception of their strengths?: Can become a friend to anybody, is not afraid to talk to anyone, confident in everything he does whether he knows how to do it or not, is a Merchant navy officer Patient states they can use these personal strengths during their treatment to contribute to their recovery: "Stay home and worry about myself and not work but still get paid.  I need to get disability, I need your help." Patient states these barriers may affect/interfere with their treatment: N/A Patient states these barriers may affect their return to the community: N/A Other important information patient would like considered in planning for their treatment: N/A  Discharge Plan:   Currently receiving community mental health services: Yes (From Whom) [facility] at [facility]) Patient states concerns and preferences for aftercare planning are: Wants African-American woman or Hispanic woman for his care.  States he wants two therapists so he cannot alternate seeing them from one week to the next.  Might be interested in a private agency. Patient states they will know when they are safe and ready for discharge when: "I'm safe and ready to discharge today." Does patient have access to transportation?: Yes Does  patient have financial barriers related to discharge medications?: No Patient description of barriers related to discharge medications: Has insurance and income -- however, he is afraid he will lose his job. Plan for living situation after discharge: Just does not know - does not want to be alone with his girlfriend right now.  Thinks he might go back to his mom's house. Will patient be returning to same living situation after discharge?: No  Summary/Recommendations:   Summary and Recommendations (to be completed by the evaluator): Patient is a 26yo male who is hospitalized with hypomania, with mother reporting he has been decompensating since moving into an apartment with his girlfriend.  His medication management provider Gretchen Short is on maternity leave so he has not been seen recently.  He reported problems at work to his mother, for which he was sent home, and he asked her to bring him to [facility] for assistance.  While he states he has been taking his medicine, it is unclear that this is the case.  He drinks socially and may use gummy CBD occasionally.  He was experiencing grandiose and paranoid delusions at the time of admission.  The patient would benefit from crisis stabilization, milieu participation, medication evaluation and management, group therapy, psychoeducation, safety monitoring, and discharge planning.  At discharge it is recommended that the patient adhere to the established aftercare plan.  Nathan Holloway. 04/09/2022

## 2022-04-09 NOTE — B[facility] Group Notes (Signed)
Goals Group 04/09/2022   Group Focus: affirmation, clarity of thought, and goals/reality orientation Treatment Modality:  Psychoeducation Interventions utilized were assignment, group exercise, and support Purpose: To be able to understand and verbalize the reason for their admission to the hospital. To understand that the medication helps with their chemical imbalance but they also need to work on their choices in life. To be challenged to develop a list of 30 positives about themselves. Also introduce the concept that "feelings" are not reality.  Participation Level:  Active  Participation Quality:  semi=Appropriate  Affect:  Appropriate  Cognitive:  delusional. Yet was able to tell another pt that his thinking was off base.  Insight:  Improving  Engagement in Group:  Engaged  Additional Comments:  RAtes his energy at a 10/10  Nathan Holloway A

## 2022-04-09 NOTE — Progress Notes (Signed)
Pt continued to make sexually inappropriate remarks to MHT while in the cafeteria. Pt was not receptive to redirection and refused to refrain from making such comments. Pt refused to leave the cafeteria when prompted 3x. MHT was able to escort Pt back to the unit after 3 additional prompts.

## 2022-04-09 NOTE — Progress Notes (Signed)
Pt made unit restriction due to pt being sexually inappropriate in the cafeteria

## 2022-04-09 NOTE — Progress Notes (Signed)
Pt up appearing very manic with rapid pressured speech, pt needing redirection . Pt encouraged to lay down and try to go back to sleep

## 2022-04-09 NOTE — BHH Suicide Risk Assessment (Addendum)
Suicide Risk Assessment  Admission Assessment    [facility] Admission Suicide Risk Assessment   Nursing information obtained from:  Patient Demographic factors:  Male, Adolescent or young adult Current Mental Status:  NA Loss Factors:  Loss of significant relationship Historical Factors:  NA Risk Reduction Factors:  Living with another person, especially a relative  Total Time spent with patient: 2 hours Principal Problem: Schizoaffective disorder, bipolar type (HCC) Diagnosis:  Principal Problem:   Schizoaffective disorder, bipolar type (HCC) Active Problems:   Anxiety state   Insomnia  History of Present Illness: Nathan Holloway is a 26 yo African American male with a mental health history of bipolar 1 d/o & schizoaffective d/o who presented to the [facility] urgent care [facility]) with his mother on 8/17 with worsening psychosis. Pt was was transferred involuntarily to this [facility] for treatment and stabilization of his mental status.  Continued Clinical Symptoms: Pt currently presents with psychosis and continuous hospitalization is necessary to treat and stabilize current mental status. Alcohol Use Disorder Identification Test Final Score (AUDIT): 4 The "Alcohol Use Disorders Identification Test", Guidelines for Use in Primary Care, Second Edition.  World Science writer [facility]). Score between 0-7:  no or low risk or alcohol related problems. Score between 8-15:  moderate risk of alcohol related problems. Score between 16-19:  high risk of alcohol related problems. Score 20 or above:  warrants further diagnostic evaluation for alcohol dependence and treatment.  CLINICAL FACTORS:   More than one psychiatric diagnosis Currently Psychotic Previous Psychiatric Diagnoses and Treatments  Musculoskeletal: Strength & Muscle Tone: within normal limits Gait & Station: normal Patient leans: N/A  Psychiatric Specialty Exam:  Presentation  General Appearance:  Disheveled  Eye Contact:Fair  Speech:Pressured  Speech Volume:Normal  Handedness:Right   Mood and Affect  Mood:Anxious  Affect:Congruent  Thought Process  Thought Processes:Disorganized  Descriptions of Associations:Tangential  Orientation:Partial  Thought Content:Illogical  History of Schizophrenia/Schizoaffective disorder:Yes  Duration of Psychotic Symptoms:No data recorded Hallucinations:Hallucinations: None  Ideas of Reference:Delusions  Suicidal Thoughts:Suicidal Thoughts: No  Homicidal Thoughts:Homicidal Thoughts: No  Sensorium  Memory:Immediate Good  Judgment:Poor  Insight:Poor  Executive Functions  Concentration:Poor  Attention Span:Fair  Recall:Poor  Fund of Knowledge:Fair  Language:Fair  Psychomotor Activity  Psychomotor Activity:Psychomotor Activity: Normal  Assets  Assets:Housing; Social Support  Sleep  Sleep:Sleep: Poor  Physical Exam: Physical Exam Constitutional:      Appearance: Normal appearance.  HENT:     Head: Normocephalic.     Nose: Nose normal.     Mouth/Throat:     Pharynx: Oropharynx is clear.  Eyes:     Pupils: Pupils are equal, round, and reactive to light.  Pulmonary:     Effort: Pulmonary effort is normal.  Musculoskeletal:     Cervical back: Normal range of motion.  Neurological:     Mental Status: He is alert.     Blood pressure (!) 136/97, pulse 92, temperature 97.8 F (36.6 C), temperature source Oral, resp. rate 18, height 5\' 7"  (1.702 m), weight 70.2 kg, SpO2 97 %. Body mass index is 24.25 kg/m.  COGNITIVE FEATURES THAT CONTRIBUTE TO RISK:  None    SUICIDE RISK:   Moderate:  Frequent suicidal ideation with limited intensity, and duration, some specificity in terms of plans, no associated intent, good self-control, limited dysphoria/symptomatology, some risk factors present, and identifiable protective factors, including available and accessible social support.    Treatment Plan  Summary: Daily contact with patient to assess and evaluate symptoms and progress in  treatment and Medication management   Observation Level/Precautions:  15 minute checks  Laboratory:  Labs reviewed   Psychotherapy:  Unit Group sessions  Medications:  See [facility]  Consultations:  To be determined   Discharge Concerns:  Safety, medication compliance, mood stability  Estimated LOS: 5-7 days  Other:  N/A    PLAN Safety and Monitoring: Voluntary admission to inpatient psychiatric unit for safety, stabilization and treatment Daily contact with patient to assess and evaluate symptoms and progress in treatment Patient's case to be discussed in multi-disciplinary team meeting Observation Level : q15 minute checks Vital signs: q12 hours Precautions: Safety   Long Term Goal(s): Improvement in symptoms so as ready for discharge   Short Term Goals: Ability to identify changes in lifestyle to reduce recurrence of condition will improve, Ability to verbalize feelings will improve, Ability to identify and develop effective coping behaviors will improve, Compliance with prescribed medications will improve, and Ability to identify triggers associated with substance abuse/mental health issues will improve   Diagnoses  Principal Problem:   Schizoaffective disorder, bipolar type (HCC) Active Problems:   Anxiety state   Insomnia   Schizoaffective d/o -Start Zyprexa 10 mg in the mornings and 15 mg at night for psychosis  -Start Depakene solution 500 mg BID for mood stabilization (will obtain level on 8/23; CBC on admission WNL and AST and ALT WNL on admission)   Anxiety -Start Ativan 1 mg BID  -Continue Hydroxyzine 25 mg every 6 hours PRN   Insomnia -Start Trazodone 50 mg nightly PRN  Elevated Creatinine -- Push po fluids and recheck Monday for trending - appears chronically elevated   Agitation protocol -Continue Zyprexa/Ativan/Geodon PRN-Please see MA   Other PRNS -Continue Tylenol 650 mg  every 6 hours PRN for mild pain -Continue Maalox 30 mg every 4 hrs PRN for indigestion -Continue Milk of Magnesia as needed every 6 hrs for constipation   Labs Reviewed: EKG with QTC-401. Reviewed hemoglobin A1C, TSH, lipid panel. CR historically slightly elevated and currently at 1.46 (will need PCP f/u after discharge).    Discharge Planning: Social work and case management to assist with discharge planning and identification of hospital follow-up needs prior to discharge Estimated LOS: 5-7 days Discharge Concerns: Need to establish a safety plan; Medication compliance and effectiveness Discharge Goals: Return home with outpatient referrals for mental health follow-up including medication management/psychotherapy    I certify that inpatient services furnished can reasonably be expected to improve the patient's condition.   Starleen Blue, NP 04/09/2022, 4:26 PM

## 2022-04-09 NOTE — Progress Notes (Signed)
Pt focused on information on the Patient Nathan Holloway of Rights. Pt reads the information but does not comprehend the information and when staff tries to explain pt can not comprehend. IVC explained to pt with no understanding. Pt continues to present with manic behaviors. Pt tried to refuse medications stating he did not need them, writer explained that pt needed all the medications due to the behavior pt was exhibiting this evening. Pt continues to be very needy due to not being able to comprehend information at this time , because pt asks the same questions repeatedly and the information given is not processed by the pt.    04/09/22 2100  Psych Admission Type (Psych Patients Only)  Admission Status Involuntary  Psychosocial Assessment  Patient Complaints Anxiety;Hyperactivity  Eye Contact Glaring  Facial Expression Flat  Affect Anxious  Speech Slow  Interaction Assertive  Motor Activity Pacing;Restless  Appearance/Hygiene Disheveled  Behavior Characteristics Agressive verbally;Restless  Mood Labile;Suspicious;Anxious;Preoccupied  Aggressive Behavior  Effect No apparent injury  Thought Process  Coherency Loose associations;Disorganized  Content Magical thinking  Delusions Religious;Paranoid  Perception Derealization  Hallucination None reported or observed  Judgment Impaired  Confusion Mild  Danger to Self  Current suicidal ideation? Denies  Danger to Others  Danger to Others None reported or observed

## 2022-04-10 MED ORDER — OLANZAPINE 10 MG PO TBDP
20.0000 mg | ORAL_TABLET | Freq: Every day | ORAL | Status: DC
  Administered 2022-04-11 – 2022-04-13 (×3): 20 mg via ORAL
  Filled 2022-04-10 (×5): qty 2

## 2022-04-10 MED ORDER — NICOTINE 14 MG/24HR TD PT24
14.0000 mg | MEDICATED_PATCH | Freq: Every day | TRANSDERMAL | Status: DC
  Administered 2022-04-10 – 2022-04-13 (×4): 14 mg via TRANSDERMAL
  Filled 2022-04-10 (×8): qty 1

## 2022-04-10 MED ORDER — ENSURE ENLIVE PO LIQD
237.0000 mL | Freq: Two times a day (BID) | ORAL | Status: DC
  Administered 2022-04-10 – 2022-04-13 (×8): 237 mL via ORAL
  Filled 2022-04-10 (×13): qty 237

## 2022-04-10 MED ORDER — TRAZODONE HCL 100 MG PO TABS
100.0000 mg | ORAL_TABLET | Freq: Every evening | ORAL | Status: DC | PRN
  Administered 2022-04-10: 100 mg via ORAL
  Filled 2022-04-10: qty 1

## 2022-04-10 MED ORDER — OLANZAPINE 10 MG PO TBDP
10.0000 mg | ORAL_TABLET | Freq: Once | ORAL | Status: AC
  Administered 2022-04-10: 10 mg via ORAL
  Filled 2022-04-10: qty 1

## 2022-04-10 MED ORDER — TRAZODONE HCL 150 MG PO TABS: 150.0000 mg | ORAL_TABLET | Freq: Every evening | ORAL | Status: DC | PRN

## 2022-04-10 NOTE — Progress Notes (Signed)
   04/10/22 1300  Psych Admission Type (Psych Patients Only)  Admission Status Involuntary  Psychosocial Assessment  Patient Complaints Anxiety;Hyperactivity  Eye Contact Glaring  Facial Expression Flat  Affect Anxious  Speech Soft;Rapid  Interaction Assertive  Motor Activity Pacing;Restless  Appearance/Hygiene Disheveled  Behavior Characteristics Cooperative  Mood Labile;Anxious  Aggressive Behavior  Effect No apparent injury  Thought Process  Coherency Disorganized;Flight of ideas;Loose associations  Content Magical thinking  Delusions Paranoid  Perception Derealization  Hallucination None reported or observed  Judgment Impaired  Confusion Mild  Danger to Self  Current suicidal ideation? Denies  Agreement Not to Harm Self Yes  Description of Agreement Verbal  Danger to Others  Danger to Others None reported or observed

## 2022-04-10 NOTE — Progress Notes (Signed)
Pt coming out his room requesting medication, but when pt gat to the medication window, pt began playing games then refused the medication then started asking for everything he could think of" can I have strawberry ensure?" Pt needing redirection

## 2022-04-10 NOTE — Group Note (Addendum)
LCSW Group Therapy   Due to threatening behavior towards CSW on 04/08/22 during 500 hall group (including this patient punching the air around CSW and saying they could "charge them" if they wanted), group was not held on 04/10/2022. Patients were offered to meet with CSW as needed.  Harrie Jeans Krayton Wortley LCSWA  12:24 PM

## 2022-04-10 NOTE — Progress Notes (Addendum)
[facility] MD Progress Note  04/10/2022 1:52 PM Nathan Holloway  MRN:  [id number]  HPI: Nathan Holloway is a 26 yo [name] with a mental health history of bipolar 1 d/o & schizoaffective d/o who presented to the [facility] urgent [facility]) with his mother on 8/17 with worsening psychosis. Pt was was transferred involuntarily to this [facility] for treatment and stabilization of his mental status.  24 hr chart review: Pt's V/S for the past 24 hrs were WNL. Sleep hours last night are documented as 4.75 hrs. As  per nursing documentation & reports, pt has been intrusive and constantly requiring verbal redirections, and has been noted to be anxious, restless & labile for the past 24 hrs. He has been compliant with all scheduled medications, and required Hydroxyzine 25 mg earlier today morning for anxiety . Pt also took his scheduled dose of Ativan 1 mg PO and Zyprexa 10 mg earlier today morning.  Today's patient assessment note: Patient initially seen in his room on the 500 hall by attending Psychiatrist and writer, but he was too sleepy and sedated at that time rendering assessment impossible. Writer again went for reassessment this afternoon, and pt continues to be very sedated and sleepy. He however, presents with flight of ideas, and utterances are incomprehensible at times.   Pt reports a poor sleep quality last night, and reports a good appetite. He denies being in any physical distress. We will change Zyprexa to dose at night with 20 mg nightly starting tomorrow night to minimize day time sedation. We will discontinue the Ativan 1 mg BID due to day time sedation. We will increase Trazodone to 100 mg PRN nightly, and if pt is still unable to sleep well at night, we will consider an increase to 150 mg as needed. We will continue Depakote 500 mg BID and will continue other medications as listed below. Depakote dose to be drawn on 8/23 @ 0600. No TD/EPS type symptoms found on  assessment, and pt denies any feelings of stiffness. AIMS: 0.   Principal Problem: Schizoaffective disorder, bipolar type ([place]) Diagnosis: Principal Problem:   Schizoaffective disorder, bipolar type ([place]) Active Problems:   Anxiety state   Insomnia  Total Time spent with patient: 20 minutes  Past Psychiatric History: as above  Past Medical History:  Past Medical History:  Diagnosis Date   Manic behavior ([place])    Psychotic affective disorder ([facility]) 08/30/2015   History reviewed. No pertinent surgical history. Family History:  Family History  Problem Relation Age of Onset   Healthy Mother    Family Psychiatric  History: See H & P Social History:  Social History   Substance and Sexual Activity  Alcohol Use No     Social History   Substance and Sexual Activity  Drug Use Not Currently   Types: Marijuana    Social History   Socioeconomic History   Marital status: Single    Spouse name: Not on file   Number of children: Not on file   Years of education: Not on file   Highest education level: Not on file  Occupational History   Not on file  Tobacco Use   Smoking status: Never   Smokeless tobacco: Never  Vaping Use   Vaping Use: Every day   Substances: Nicotine, Flavoring  Substance and Sexual Activity   Alcohol use: No   Drug use: Not Currently    Types: Marijuana   Sexual activity: Yes  Other Topics Concern  Not on file  Social History Narrative   Not on file   Social Determinants of Health   Financial Resource Strain: Low Risk  (09/18/2020)   Overall Financial Resource Strain (CARDIA)    Difficulty of Paying Living Expenses: Not very hard  Food Insecurity: No Food Insecurity (09/18/2020)   Hunger Vital Sign    Worried About Running Out of Food in the Last Year: Never true    Ran Out of Food in the Last Year: Never true  Transportation Needs: No Transportation Needs (09/18/2020)   PRAPARE - Administrator, Civil Service (Medical): No    Lack  of Transportation (Non-Medical): No  Physical Activity: Sufficiently Active (09/18/2020)   Exercise Vital Sign    Days of Exercise per Week: 4 days    Minutes of Exercise per Session: 60 min  Stress: No Stress Concern Present (09/18/2020)   Nathan Holloway of Occupational Health - Occupational Stress Questionnaire    Feeling of Stress : Not at all  Social Connections: Socially Isolated (09/18/2020)   Social Connection and Isolation Panel [NHANES]    Frequency of Communication with Friends and Family: More than three times a week    Frequency of Social Gatherings with Friends and Family: Twice a week    Attends Religious Services: Never    Database administrator or Organizations: No    Attends Engineer, structural: Never    Marital Status: Never married   Additional Social History:   Sleep: Poor  Appetite:  Good  Current Medications: Current Facility-Administered Medications  Medication Dose Route Frequency Provider Last Rate Last Admin   acetaminophen (TYLENOL) tablet 650 mg  650 mg Oral Q6H PRN Lenard Lance, FNP   650 mg at 04/09/22 0752   alum & mag hydroxide-simeth (MAALOX/MYLANTA) 200-200-20 MG/5ML suspension 30 mL  30 mL Oral Q4H PRN Lenard Lance, FNP       benztropine (COGENTIN) tablet 1 mg  1 mg Oral BID PRN Comer Locket, MD       Or   benztropine mesylate (COGENTIN) injection 1 mg  1 mg Intramuscular BID PRN Comer Locket, MD       feeding supplement (ENSURE ENLIVE / ENSURE PLUS) liquid 237 mL  237 mL Oral BID BM Nkwenti, Doris, NP   237 mL at 04/10/22 1000   hydrOXYzine (ATARAX) tablet 25 mg  25 mg Oral TID PRN Lenard Lance, FNP   25 mg at 04/10/22 0819   OLANZapine zydis (ZYPREXA) disintegrating tablet 5 mg  5 mg Oral Q8H PRN Lenard Lance, FNP       And   LORazepam (ATIVAN) tablet 1 mg  1 mg Oral PRN Lenard Lance, FNP       And   ziprasidone (GEODON) injection 20 mg  20 mg Intramuscular PRN Lenard Lance, FNP       magnesium hydroxide (MILK OF  MAGNESIA) suspension 30 mL  30 mL Oral Daily PRN Lenard Lance, FNP       nicotine (NICODERM CQ - dosed in mg/24 hours) patch 14 mg  14 mg Transdermal Daily Starleen Blue, NP   14 mg at 04/10/22 1253   OLANZapine zydis (ZYPREXA) disintegrating tablet 10 mg  10 mg Oral Once Starleen Blue, NP       [START ON 04/11/2022] OLANZapine zydis (ZYPREXA) disintegrating tablet 20 mg  20 mg Oral QHS Starleen Blue, NP       traZODone (DESYREL) tablet 100  mg  100 mg Oral QHS PRN Starleen Blue, NP       valproic acid (DEPAKENE) 250 MG/5ML solution 500 mg  500 mg Oral BID Starleen Blue, NP   500 mg at 04/10/22 5027    Lab Results: No results found for this or any previous visit (from the past 48 hour(s)).  Blood Alcohol level:  Lab Results  Component Value Date   ETH <10 04/07/2022   ETH <10 08/06/2020    Metabolic Disorder Labs: Lab Results  Component Value Date   HGBA1C 4.3 (L) 04/07/2022   MPG 76.71 04/07/2022   MPG 85.32 08/06/2020   Lab Results  Component Value Date   PROLACTIN 11.2 04/07/2022   PROLACTIN 3.7 (L) 07/04/2020   Lab Results  Component Value Date   CHOL 218 (H) 04/07/2022   TRIG 75 04/07/2022   HDL 80 04/07/2022   CHOLHDL 2.7 04/07/2022   VLDL 15 04/07/2022   LDLCALC 123 (H) 04/07/2022   LDLCALC 163 (H) 12/29/2020    Physical Findings: AIMS:  , ,  ,  ,    CIWA:    COWS:     Musculoskeletal: Strength & Muscle Tone: within normal limits Gait & Station: normal Patient leans: N/A  Psychiatric Specialty Exam:  Presentation  General Appearance: Fairly Groomed  Eye Contact:Fair  Speech:Clear and Coherent  Speech Volume:Normal  Handedness:Right   Mood and Affect  Mood:sedated   Affect:Congruent   Thought Process  Thought Processes:Disorganized  Descriptions of Associations:Tangential  Orientation:Full (Time, Place and Person)  Thought Content:Illogical  History of Schizophrenia/Schizoaffective disorder:Yes  Duration of Psychotic  Symptoms:No data recorded Hallucinations:Hallucinations: None  Ideas of Reference:Delusions  Suicidal Thoughts:Suicidal Thoughts: No  Homicidal Thoughts:Homicidal Thoughts: No   Sensorium  Memory:Immediate Good  Judgment:Poor  Insight:Poor  Executive Functions  Concentration:Poor  Attention Span:Poor  Recall:Fair  Fund of Knowledge:Fair  Language:Fair  Psychomotor Activity  Psychomotor Activity:Psychomotor Activity: Normal  Assets  Assets:Housing; Social Support  Sleep  Sleep:Sleep: Poor  Physical Exam: Physical Exam Review of Systems  Constitutional: Negative.   HENT: Negative.    Eyes: Negative.   Respiratory: Negative.    Cardiovascular: Negative.  Negative for chest pain.  Gastrointestinal: Negative.   Genitourinary: Negative.   Musculoskeletal: Negative.   Skin: Negative.   Neurological: Negative.   Psychiatric/Behavioral:  Negative for depression, hallucinations, memory loss, substance abuse and suicidal ideas. The patient is nervous/anxious and has insomnia.    Blood pressure 129/75, pulse 63, temperature 97.8 F (36.6 C), temperature source Oral, resp. rate 18, height 5\' 7"  (1.702 m), weight 70.2 kg, SpO2 97 %. Body mass index is 24.25 kg/m.  Treatment Plan Summary: Daily contact with patient to assess and evaluate symptoms and progress in treatment and Medication management   Observation Level/Precautions:  15 minute checks  Laboratory:  Labs reviewed   Psychotherapy:  Unit Group sessions  Medications:  See [facility]  Consultations:  To be determined   Discharge Concerns:  Safety, medication compliance, mood stability  Estimated LOS: 5-7 days  Other:  N/A    PLAN Safety and Monitoring: Voluntary admission to inpatient psychiatric unit for safety, stabilization and treatment Daily contact with patient to assess and evaluate symptoms and progress in treatment Patient's case to be discussed in multi-disciplinary team meeting Observation Level :  q15 minute checks Vital signs: q12 hours Precautions: Safety   Long Term Goal(s): Improvement in symptoms so as ready for discharge   Short Term Goals: Ability to identify changes in lifestyle to reduce recurrence  of condition will improve, Ability to verbalize feelings will improve, Ability to identify and develop effective coping behaviors will improve, Compliance with prescribed medications will improve, and Ability to identify triggers associated with substance abuse/mental health issues will improve   Diagnoses  Principal Problem:   Schizoaffective disorder, bipolar type ([place]) Active Problems:   Anxiety state   Insomnia   Schizoaffective d/o -Change Zyprexa zydis to 20 mg nightly starting 8/21 to minimize day time sedation -Continue Depakene solution 500 mg BID for mood stabilization (will obtain level on 8/23 - CBC WNL and AST and ALT WNL on admission)   Anxiety -Discontinue Ativan 1 mg BID d/t daytime sedation -Continue Hydroxyzine 25 mg every 6 hours PRN   Insomnia -Increase Trazodone to 100 mg nightly PRN   Agitation protocol -Continue Zyprexa/Ativan/Geodon PRN-Please see MAR   Elevated creatinine -- Encourage po fluids and recheck BMP for trending Monday - appears chronically elevated and needs PCP f/u after discharge   Other PRNS -Continue Tylenol 650 mg every 6 hours PRN for mild pain -Continue Maalox 30 mg every 4 hrs PRN for indigestion -Continue Milk of Magnesia as needed every 6 hrs for constipation   Labs Reviewed: EKG with QTC-401. Reviewed hemoglobin A1C, TSH, lipid panel. CR historically slightly elevated and currently at 1.46 (will need PCP f/u after discharge). Repeat CBC ordered-pending   Discharge Planning: Social work and case management to assist with discharge planning and identification of hospital follow-up needs prior to discharge Estimated LOS: 5-7 days Discharge Concerns: Need to establish a safety plan; Medication compliance and  effectiveness Discharge Goals: Return home with outpatient referrals for mental health follow-up including medication management/psychotherapy  Nicholes Rough, NP 04/10/2022, 1:52 PM

## 2022-04-10 NOTE — Progress Notes (Signed)
Pt did not attend group. 

## 2022-04-10 NOTE — [facility] Group Notes (Signed)
Adult Psychoeducational Group Note Date:  04/10/2022 Time:  1287-8676 Group Topic/Focus: PROGRESSIVE RELAXATION. A group where deep breathing is taught and tensing and relaxation muscle groups is used. Imagery is used as well.  Pts are asked to imagine 3 pillars that hold them up when they are not able to hold themselves up and to share that with the group.  Participation Level:  limited  Participation Quality:  pt attempted to participate at times and then would fall asleep  Affect:  inappropriate  Cognitive:  lacking Orientation  Insight: lacking  Engagement in Group:  pt attends and makes an effort to participate.  Modes of Intervention:  Activity, Discussion, Education, and Support  Additional Comments:  Rates his energy at a 10/10. States music and technology support him.Was sleepy throughout the group.   Dione Housekeeper

## 2022-04-10 NOTE — Progress Notes (Signed)
Pt up coming out his room constantly talking , pt informed that it is night time and other patients are sleeping, pt waking up other patients. Pt needing constant redirection

## 2022-04-10 NOTE — Progress Notes (Signed)
   04/10/22 2000  Psych Admission Type (Psych Patients Only)  Admission Status Involuntary  Psychosocial Assessment  Patient Complaints Anxiety;Hyperactivity  Eye Contact Glaring  Facial Expression Flat  Affect Anxious  Speech Pressured  Interaction Assertive  Motor Activity Pacing;Restless  Appearance/Hygiene Disheveled  Behavior Characteristics Cooperative  Mood Labile;Anxious  Aggressive Behavior  Effect No apparent injury  Thought Process  Coherency Loose associations;Disorganized  Content Magical thinking  Delusions Religious;Paranoid  Perception Derealization  Hallucination None reported or observed  Judgment Impaired  Confusion Mild  Danger to Self  Current suicidal ideation? Denies  Danger to Others  Danger to Others None reported or observed

## 2022-04-10 NOTE — Progress Notes (Signed)
Pt  had to be told twice not to sit on the table while using the phone in group.  This writer was able to redirect pt. After pt got his snacks he left the day room and did not return.

## 2022-04-10 NOTE — Progress Notes (Signed)
   04/10/22 0515  Sleep  Number of Hours 4.75

## 2022-04-11 ENCOUNTER — Encounter (HOSPITAL_COMMUNITY): Payer: Self-pay

## 2022-04-11 LAB — BASIC METABOLIC PANEL
Anion gap: 8 (ref 5–15)
BUN: 18 mg/dL (ref 6–20)
CO2: 30 mmol/L (ref 22–32)
Calcium: 9.7 mg/dL (ref 8.9–10.3)
Chloride: 101 mmol/L (ref 98–111)
Creatinine, Ser: 1.44 mg/dL — ABNORMAL HIGH (ref 0.61–1.24)
GFR, Estimated: >60 mL/min (ref >60)
Glucose, Bld: 96 mg/dL (ref 70–99)
Potassium: 4.1 mmol/L (ref 3.5–5.1)
Sodium: 139 mmol/L (ref 135–145)

## 2022-04-11 MED ORDER — TRAZODONE HCL 150 MG PO TABS
150.0000 mg | ORAL_TABLET | Freq: Every evening | ORAL | Status: DC | PRN
  Administered 2022-04-11 – 2022-04-13 (×3): 150 mg via ORAL
  Filled 2022-04-11 (×3): qty 1

## 2022-04-11 NOTE — Progress Notes (Signed)
   04/11/22 0530  Sleep  Number of Hours 6.25

## 2022-04-11 NOTE — Progress Notes (Signed)
Pt up coming out the room being very manic, pt needing constant redirection ,pt offered PO medication and medication was spit back in the cup, pt given IM Geodon

## 2022-04-11 NOTE — Progress Notes (Signed)
   04/11/22 0800  Psych Admission Type (Psych Patients Only)  Admission Status Involuntary  Psychosocial Assessment  Patient Complaints Anxiety;Hyperactivity  Eye Contact Fair  Facial Expression Flat  Affect Anxious  Speech Pressured  Interaction Assertive  Motor Activity Restless  Appearance/Hygiene Disheveled  Behavior Characteristics Cooperative  Mood Anxious  Aggressive Behavior  Effect No apparent injury  Thought Process  Coherency Loose associations;Disorganized  Content Magical thinking  Delusions Religious;Paranoid  Perception Derealization  Hallucination None reported or observed  Judgment Impaired  Confusion Mild  Danger to Self  Current suicidal ideation? Denies  Agreement Not to Harm Self Yes  Description of Agreement verbal  Danger to Others  Danger to Others None reported or observed

## 2022-04-11 NOTE — Group Note (Signed)
[facility] LCSW Group Therapy   04/11/2022 1pm    Type of Therapy and Topic:  Group Therapy:  Strengths Exploration   Participation Level: Active  Description of Group: This group allows individuals to explore their strengths, learn to use strengths in new ways to improve well-being. Strengths-based interventions involve identifying strengths, understanding how they are used, and learning new ways to apply them. Individuals will identify their strengths, and then explore their roles in different areas of life (relationships, professional life, and personal fulfillment). Individuals will think about ways in which they currently use their strengths, along with new ways they could begin using them.    Therapeutic Goals Patient will verbalize two of their strengths Patient will identify how their strengths are currently used Patient will identify two new ways to apply their strengths  Patients will create a plan to apply their strengths in their daily lives     Summary of Patient Progress:  Patient actively participated in group.  Patient was mostly appropriate and was able to participate in group discussion and activity around group topic.  Patient shared that he was courageous and that he felt like others that he admired also had courageous qualities.  Patient seemed slightly hyperreligious but was able to be redirected.        Therapeutic Modalities Cognitive Behavioral Therapy Motivational Interviewing    Eligh Rybacki, LCSW, LCAS Clincal Social Worker  [facility]

## 2022-04-11 NOTE — BH IP Treatment Plan (Unsigned)
Interdisciplinary Treatment and Diagnostic Plan Update  04/11/2022 Time of Session: 9:35 am Nathan Holloway MRN: [id number]  Principal Diagnosis: Schizoaffective disorder, bipolar type [facility])  Secondary Diagnoses: Principal Problem:   Schizoaffective disorder, bipolar type (HCC) Active Problems:   Anxiety state   Insomnia   Current Medications:  Current Facility-Administered Medications  Medication Dose Route Frequency Provider Last Rate Last Admin   acetaminophen (TYLENOL) tablet 650 mg  650 mg Oral Q6H PRN Lenard Lance, FNP   650 mg at 04/09/22 0752   alum & mag hydroxide-simeth (MAALOX/MYLANTA) 200-200-20 MG/5ML suspension 30 mL  30 mL Oral Q4H PRN Lenard Lance, FNP       benztropine (COGENTIN) tablet 1 mg  1 mg Oral BID PRN Comer Locket, MD       Or   benztropine mesylate (COGENTIN) injection 1 mg  1 mg Intramuscular BID PRN Comer Locket, MD       feeding supplement (ENSURE ENLIVE / ENSURE PLUS) liquid 237 mL  237 mL Oral BID BM Starleen Blue, NP   237 mL at 04/11/22 0831   hydrOXYzine (ATARAX) tablet 25 mg  25 mg Oral TID PRN Lenard Lance, FNP   25 mg at 04/10/22 0819   OLANZapine zydis (ZYPREXA) disintegrating tablet 5 mg  5 mg Oral Q8H PRN Lenard Lance, FNP       And   LORazepam (ATIVAN) tablet 1 mg  1 mg Oral PRN Lenard Lance, FNP       magnesium hydroxide (MILK OF MAGNESIA) suspension 30 mL  30 mL Oral Daily PRN Lenard Lance, FNP       nicotine (NICODERM CQ - dosed in mg/24 hours) patch 14 mg  14 mg Transdermal Daily Starleen Blue, NP   14 mg at 04/11/22 0831   OLANZapine zydis (ZYPREXA) disintegrating tablet 20 mg  20 mg Oral QHS Nkwenti, Doris, NP       traZODone (DESYREL) tablet 150 mg  150 mg Oral QHS PRN Starleen Blue, NP       valproic acid (DEPAKENE) 250 MG/5ML solution 500 mg  500 mg Oral BID Starleen Blue, NP   500 mg at 04/11/22 0827   PTA Medications: Medications Prior to Admission  Medication Sig Dispense Refill Last Dose   OLANZapine (ZYPREXA)  15 MG tablet Take 1 tablet (15 mg total) by mouth at bedtime. (Patient taking differently: Take 7.5 mg by mouth at bedtime.) 30 tablet 3     Patient Stressors:    Patient Strengths:    Treatment Modalities: Medication Management, Group therapy, Case management,  1 to 1 session with clinician, Psychoeducation, Recreational therapy.   Physician Treatment Plan for Primary Diagnosis: Schizoaffective disorder, bipolar type (HCC) Long Term Goal(s): Improvement in symptoms so as ready for discharge   Short Term Goals: Ability to identify changes in lifestyle to reduce recurrence of condition will improve Ability to verbalize feelings will improve Ability to identify and develop effective coping behaviors will improve Compliance with prescribed medications will improve Ability to identify triggers associated with substance abuse/mental health issues will improve  Medication Management: Evaluate patient's response, side effects, and tolerance of medication regimen.  Therapeutic Interventions: 1 to 1 sessions, Unit Group sessions and Medication administration.  Evaluation of Outcomes: Progressing  Physician Treatment Plan for Secondary Diagnosis: Principal Problem:   Schizoaffective disorder, bipolar type (HCC) Active Problems:   Anxiety state   Insomnia  Long Term Goal(s): Improvement in symptoms so as ready for discharge  Short Term Goals: Ability to identify changes in lifestyle to reduce recurrence of condition will improve Ability to verbalize feelings will improve Ability to identify and develop effective coping behaviors will improve Compliance with prescribed medications will improve Ability to identify triggers associated with substance abuse/mental health issues will improve     Medication Management: Evaluate patient's response, side effects, and tolerance of medication regimen.  Therapeutic Interventions: 1 to 1 sessions, Unit Group sessions and Medication  administration.  Evaluation of Outcomes: Progressing   RN Treatment Plan for Primary Diagnosis: Schizoaffective disorder, bipolar type (HCC) Long Term Goal(s): Knowledge of disease and therapeutic regimen to maintain health will improve  Short Term Goals: Ability to remain free from injury will improve, Ability to verbalize frustration and anger appropriately will improve, Ability to demonstrate self-control, Ability to participate in decision making will improve, Ability to verbalize feelings will improve, Ability to disclose and discuss suicidal ideas, Ability to identify and develop effective coping behaviors will improve, and Compliance with prescribed medications will improve  Medication Management: RN will administer medications as ordered by provider, will assess and evaluate patient's response and provide education to patient for prescribed medication. RN will report any adverse and/or side effects to prescribing provider.  Therapeutic Interventions: 1 on 1 counseling sessions, Psychoeducation, Medication administration, Evaluate responses to treatment, Monitor vital signs and CBGs as ordered, Perform/monitor CIWA, COWS, AIMS and Fall Risk screenings as ordered, Perform wound care treatments as ordered.  Evaluation of Outcomes: Progressing   LCSW Treatment Plan for Primary Diagnosis: Schizoaffective disorder, bipolar type (HCC) Long Term Goal(s): Safe transition to appropriate next level of care at discharge, Engage patient in therapeutic group addressing interpersonal concerns.  Short Term Goals: Engage patient in aftercare planning with referrals and resources, Increase social support, Increase ability to appropriately verbalize feelings, Increase emotional regulation, Facilitate acceptance of mental health diagnosis and concerns, Facilitate patient progression through stages of change regarding substance use diagnoses and concerns, Identify triggers associated with mental  health/substance abuse issues, and Increase skills for wellness and recovery  Therapeutic Interventions: Assess for all discharge needs, 1 to 1 time with Social worker, Explore available resources and support systems, Assess for adequacy in community support network, Educate family and significant other(s) on suicide prevention, Complete Psychosocial Assessment, Interpersonal group therapy.  Evaluation of Outcomes: Progressing   Progress in Treatment: Attending groups: Yes. Participating in groups: Yes. Taking medication as prescribed: Yes. Toleration medication: Yes. Family/Significant other contact made: No, will contact:  Mother Kaynan Klonowski [phone] or Grandmother Binnie Kand [phone] (prefers her) Patient understands diagnosis: Yes. Discussing patient identified problems/goals with staff: Yes. Medical problems stabilized or resolved: Yes. Denies suicidal/homicidal ideation: Yes. Issues/concerns per patient self-inventory: No.   New problem(s) identified: No, Describe:  none reported   New Short Term/Long Term Goal(s):  medication stabilization, elimination of SI thoughts, development of comprehensive mental wellness plan.    Patient Goals:  Pt states that he would like to continue working toward a safe discharge plan.  Discharge Plan or Barriers: Patient recently admitted. CSW will continue to follow and assess for appropriate referrals and possible discharge planning.    Reason for Continuation of Hospitalization: Anxiety Depression Hallucinations Mania Medication stabilization  Estimated Length of Stay: 3-7 days  Last 3 [place] Suicide Severity Risk Score: Flowsheet Row [facility] (Current) [facility]/07[facility]2022 in [facility] ED from 11/04/2021 in [facility] RISK CATEGORY No  Risk No Risk No Risk       Last PHQ 2/9 Scores:     04/07/2022    7:28 PM 12/30/2020   10:39 AM 12/24/2020    3:32 PM  Depression screen PHQ 2/9  Decreased Interest 0 0 1  Down, Depressed, Hopeless 2 0 1  PHQ - 2 Score 2 0 2  Altered sleeping 2 1 3   Tired, decreased energy 0 1 1  Change in appetite 2 0 1  Feeling bad or failure about yourself  0 0 0  Trouble concentrating 1 0 1  Moving slowly or fidgety/restless 2 0 0  Suicidal thoughts 0 0 0  PHQ-9 Score 9 2 8   Difficult doing work/chores Very difficult  Somewhat difficult    Scribe for Treatment Team: , LCSW 04/11/2022 12:25 PM

## 2022-04-11 NOTE — Progress Notes (Signed)
   04/11/22 2200  Psych Admission Type (Psych Patients Only)  Admission Status Involuntary  Psychosocial Assessment  Patient Complaints Anxiety;Hyperactivity  Eye Contact Glaring  Facial Expression Flat  Affect Anxious  Speech Pressured  Interaction Assertive  Motor Activity Pacing;Restless  Appearance/Hygiene Disheveled  Behavior Characteristics Cooperative  Mood Anxious;Labile  Aggressive Behavior  Effect No apparent injury  Thought Process  Coherency Loose associations;Disorganized  Content Magical thinking  Delusions Religious;Paranoid  Perception Derealization  Hallucination None reported or observed  Judgment Impaired  Confusion Mild  Danger to Self  Current suicidal ideation? Denies  Danger to Others  Danger to Others None reported or observed

## 2022-04-11 NOTE — Progress Notes (Signed)
Adult Psychoeducational Group Note  Date:  04/11/2022 Time:  8:52 PM  Group Topic/Focus:  Wrap-Up Group:   The focus of this group is to help patients review their daily goal of treatment and discuss progress on daily workbooks.  Participation Level:  Active  Participation Quality:  Appropriate  Affect:  Appropriate  Cognitive:  Appropriate  Insight: Appropriate  Engagement in Group:  Improving  Modes of Intervention:  Discussion  Additional Comments:  Pt stated his goal for today was to focus on his treatment plan. Pt stated he accomplished his goal today. Pt stated he talked with his doctor and social worker about his care today. Pt rated his overall day a 10. Pt stated he feels much better and glad he is here getting treatment. Pt stated he was able to contact his mother, grandmother, brother, and his sister today. Pt stated his mother coming for visitation tonight improve his night. Pt stated he felt better about himself today. Pt stated he staff brought back all meals today because he is own unit restriction. Pt stated he took all medications provided today. Pt stated he attend all groups held today. Pt stated his appetite was pretty good today. Pt rated sleep last night was pretty good. Pt stated the goal tonight was to get some rest. Pt stated he had no physical pain today. Pt deny visual hallucinations and auditory issues tonight. Pt denies thoughts of harming himself or others. Pt stated he would alert staff if anything changed  Felipa Furnace 04/11/2022, 8:52 PM

## 2022-04-11 NOTE — Progress Notes (Signed)
[facility] MD Progress Note  04/11/2022 11:48 AM Nathan Holloway  MRN:  [id number]  HPI: Nathan Holloway is a 26 yo African American male with a mental health history of bipolar 1 d/o & schizoaffective d/o who presented to [place] [facility] urgent [facility]) with his mother on 8/17 with worsening psychosis. Pt was was transferred involuntarily to this [facility] for treatment and stabilization of his mental status.  24 hr chart review: Pt's V/S for the past 24 hrs were WNL. Sleep hours last night are documented as 6.25 hrs. As  per nursing documentation and reports, pt was intrusive last night, and constantly requiring verbal redirections, and spat out his PO medications, leading to IM Geodon 20 mg. He was also given Zyprexa 5 mg for agitation and Hydroxyzine 25 mg for anxiety last night.   Today's patient assessment note: Patient seen today for this encounter in his room on the 500 hall accompanied by attending Psychiatrist. Pt is calm and cooperative during this encounter,  mood is euthymic & affect is congruent. His attention to personal hygiene and grooming is fair, eye contact is good, speech is clear & coherent. Thought contents are organized and logical, and pt currently denies SI/HI/AVH or paranoia.  He presents with some grandiosity, and states that he is hoping to be discharged by the 24th of this month "because my new single drops on Youtube, and I want to see it happen." He however denies wanting to be famous, and talks about his love for rap, and that he just wants his story to be heard. He talks about experiencing paranoia 4-5 yrs ago, and denies that it is happening now. He verbalizes feeling better.  Pt reports a good sleep quality last night, and reports a good appetite. He denies being in any physical distress. We continue Zyprexa 20 mg nightly to minimize daytime sedation. We will continue Depakote 500 mg BID and increase Trazodone to 150 mg nightly PRN for insomnia. We will  continue other medications as listed below. Depakote dose to be drawn on 8/23 @ 0600. No TD/EPS type symptoms found on assessment, and pt denies any feelings of stiffness. AIMS: 0.   Principal Problem: Schizoaffective disorder, bipolar type (HCC) Diagnosis: Principal Problem:   Schizoaffective disorder, bipolar type (HCC) Active Problems:   Anxiety state   Insomnia  Total Time spent with patient: 20 minutes  Past Psychiatric History: as above  Past Medical History:  Past Medical History:  Diagnosis Date   Manic behavior (HCC)    Psychotic affective disorder (HCC) 08/30/2015   History reviewed. No pertinent surgical history. Family History:  Family History  Problem Relation Age of Onset   Healthy Mother    Family Psychiatric  History: See H & P Social History:  Social History   Substance and Sexual Activity  Alcohol Use No     Social History   Substance and Sexual Activity  Drug Use Not Currently   Types: Marijuana    Social History   Socioeconomic History   Marital status: Single    Spouse name: Not on file   Number of children: Not on file   Years of education: Not on file   Highest education level: Not on file  Occupational History   Not on file  Tobacco Use   Smoking status: Never   Smokeless tobacco: Never  Vaping Use   Vaping Use: Every day   Substances: Nicotine, Flavoring  Substance and Sexual Activity   Alcohol use: No  Drug use: Not Currently    Types: Marijuana   Sexual activity: Yes  Other Topics Concern   Not on file  Social History Narrative   Not on file   Social Determinants of Health   Financial Resource Strain: Low Risk  (09/18/2020)   Overall Financial Resource Strain (CARDIA)    Difficulty of Paying Living Expenses: Not very hard  Food Insecurity: No Food Insecurity (09/18/2020)   Hunger Vital Sign    Worried About Running Out of Food in the Last Year: Never true    Ran Out of Food in the Last Year: Never true  Transportation  Needs: No Transportation Needs (09/18/2020)   PRAPARE - Administrator, Civil Service (Medical): No    Lack of Transportation (Non-Medical): No  Physical Activity: Sufficiently Active (09/18/2020)   Exercise Vital Sign    Days of Exercise per Week: 4 days    Minutes of Exercise per Session: 60 min  Stress: No Stress Concern Present (09/18/2020)   [employer] of Occupational Health - Occupational Stress Questionnaire    Feeling of Stress : Not at all  Social Connections: Socially Isolated (09/18/2020)   Social Connection and Isolation Panel [NHANES]    Frequency of Communication with Friends and Family: More than three times a week    Frequency of Social Gatherings with Friends and Family: Twice a week    Attends Religious Services: Never    Database administrator or Organizations: No    Attends Engineer, structural: Never    Marital Status: Never married   Additional Social History:   Sleep: Poor  Appetite:  Good  Current Medications: Current Facility-Administered Medications  Medication Dose Route Frequency Provider Last Rate Last Admin   acetaminophen (TYLENOL) tablet 650 mg  650 mg Oral Q6H PRN Lenard Lance, FNP   650 mg at 04/09/22 0752   alum & mag hydroxide-simeth (MAALOX/MYLANTA) 200-200-20 MG/5ML suspension 30 mL  30 mL Oral Q4H PRN Lenard Lance, FNP       benztropine (COGENTIN) tablet 1 mg  1 mg Oral BID PRN Comer Locket, MD       Or   benztropine mesylate (COGENTIN) injection 1 mg  1 mg Intramuscular BID PRN Comer Locket, MD       feeding supplement (ENSURE ENLIVE / ENSURE PLUS) liquid 237 mL  237 mL Oral BID BM Nathan Nudelman, NP   237 mL at 04/11/22 0831   hydrOXYzine (ATARAX) tablet 25 mg  25 mg Oral TID PRN Lenard Lance, FNP   25 mg at 04/10/22 0819   OLANZapine zydis (ZYPREXA) disintegrating tablet 5 mg  5 mg Oral Q8H PRN Lenard Lance, FNP       And   LORazepam (ATIVAN) tablet 1 mg  1 mg Oral PRN Lenard Lance, FNP       magnesium  hydroxide (MILK OF MAGNESIA) suspension 30 mL  30 mL Oral Daily PRN Lenard Lance, FNP       nicotine (NICODERM CQ - dosed in mg/24 hours) patch 14 mg  14 mg Transdermal Daily Nathan Blue, NP   14 mg at 04/11/22 0831   OLANZapine zydis (ZYPREXA) disintegrating tablet 20 mg  20 mg Oral QHS Nathan Newkirk, NP       traZODone (DESYREL) tablet 150 mg  150 mg Oral QHS PRN Nathan Parmelee, NP       valproic acid (DEPAKENE) 250 MG/5ML solution 500 mg  500 mg  Oral BID Nathan Blue, NP   500 mg at 04/11/22 6389    Lab Results: No results found for this or any previous visit (from the past 48 hour(s)).  Blood Alcohol level:  Lab Results  Component Value Date   ETH <10 04/07/2022   ETH <10 08/06/2020    Metabolic Disorder Labs: Lab Results  Component Value Date   HGBA1C 4.3 (L) 04/07/2022   MPG 76.71 04/07/2022   MPG 85.32 08/06/2020   Lab Results  Component Value Date   PROLACTIN 11.2 04/07/2022   PROLACTIN 3.7 (L) 07/04/2020   Lab Results  Component Value Date   CHOL 218 (H) 04/07/2022   TRIG 75 04/07/2022   HDL 80 04/07/2022   CHOLHDL 2.7 04/07/2022   VLDL 15 04/07/2022   LDLCALC 123 (H) 04/07/2022   LDLCALC 163 (H) 12/29/2020    Physical Findings: AIMS:  , ,  ,  ,    CIWA:    COWS:     Musculoskeletal: Strength & Muscle Tone: within normal limits Gait & Station: normal Patient leans: N/A  Psychiatric Specialty Exam:  Presentation  General Appearance: Appropriate for Environment; Fairly Groomed  Eye Contact:Good  Speech:Clear and Coherent  Speech Volume:Normal  Handedness:Right   Mood and Affect  Mood:sedated   Affect:Congruent   Thought Process  Thought Processes:Coherent  Descriptions of Associations:Intact  Orientation:Full (Time, Place and Person)  Thought Content:Logical  History of Schizophrenia/Schizoaffective disorder:Yes  Duration of Psychotic Symptoms:No data recorded Hallucinations:Hallucinations: None  Ideas of  Reference:None  Suicidal Thoughts:Suicidal Thoughts: No  Homicidal Thoughts:Homicidal Thoughts: No   Sensorium  Memory:Immediate Good  Judgment:Fair  Insight:Fair  Executive Functions  Concentration:Fair  Attention Span:Fair  Recall:Fair  Fund of Knowledge:Fair  Language:Fair  Psychomotor Activity  Psychomotor Activity:Psychomotor Activity: Normal  Assets  Assets:Communication Skills  Sleep  Sleep:Sleep: Poor  Physical Exam: Physical Exam Constitutional:      Appearance: Normal appearance.  HENT:     Head: Normocephalic.     Nose: Nose normal.  Eyes:     Pupils: Pupils are equal, round, and reactive to light.  Pulmonary:     Effort: Pulmonary effort is normal.  Musculoskeletal:        General: Normal range of motion.     Cervical back: Normal range of motion.  Neurological:     Mental Status: He is alert and oriented to person, place, and time.     Sensory: No sensory deficit.     Coordination: Coordination normal.  Psychiatric:        Behavior: Behavior normal.        Thought Content: Thought content normal.    Review of Systems  Constitutional: Negative.   HENT: Negative.    Eyes: Negative.   Respiratory: Negative.    Cardiovascular: Negative.  Negative for chest pain.  Gastrointestinal: Negative.   Genitourinary: Negative.   Musculoskeletal: Negative.   Skin: Negative.   Neurological: Negative.   Psychiatric/Behavioral:  Negative for depression, hallucinations, memory loss, substance abuse and suicidal ideas. The patient is nervous/anxious and has insomnia.    Blood pressure 127/73, pulse 68, temperature 97.8 F (36.6 C), temperature source Oral, resp. rate 16, height 5\' 7"  (1.702 m), weight 70.2 kg, SpO2 99 %. Body mass index is 24.25 kg/m.  Treatment Plan Summary: Daily contact with patient to assess and evaluate symptoms and progress in treatment and Medication management   Observation Level/Precautions:  15 minute checks   Laboratory:  Labs reviewed   Psychotherapy:  Unit Group sessions  Medications:  See [facility]  Consultations:  To be determined   Discharge Concerns:  Safety, medication compliance, mood stability  Estimated LOS: 5-7 days  Other:  N/A    PLAN Safety and Monitoring: Voluntary admission to inpatient psychiatric unit for safety, stabilization and treatment Daily contact with patient to assess and evaluate symptoms and progress in treatment Patient's case to be discussed in multi-disciplinary team meeting Observation Level : q15 minute checks Vital signs: q12 hours Precautions: Safety   Long Term Goal(s): Improvement in symptoms so as ready for discharge   Short Term Goals: Ability to identify changes in lifestyle to reduce recurrence of condition will improve, Ability to verbalize feelings will improve, Ability to identify and develop effective coping behaviors will improve, Compliance with prescribed medications will improve, and Ability to identify triggers associated with substance abuse/mental health issues will improve   Diagnoses  Principal Problem:   Schizoaffective disorder, bipolar type (HCC) Active Problems:   Anxiety state   Insomnia   Schizoaffective d/o -Continue Zyprexa zydis 20 mg nightly minimize day time sedation -Continue Depakene solution 500 mg BID for mood stabilization (will obtain level on 8/23 - CBC WNL and AST and ALT WNL on admission)   Anxiety -previously discontinued Ativan 1 mg BID d/t daytime sedation -Continue Hydroxyzine 25 mg every 6 hours PRN   Insomnia -Increase Trazodone to 150 mg nightly PRN   Agitation protocol -Continue Zyprexa/Ativan/Geodon PRN-Please see MAR   Elevated creatinine -- Encourage po fluids and recheck BMP for trending Monday - appears chronically elevated and needs PCP f/u after discharge   Other PRNS -Continue Tylenol 650 mg every 6 hours PRN for mild pain -Continue Maalox 30 mg every 4 hrs PRN for indigestion -Continue  Milk of Magnesia as needed every 6 hrs for constipation   Labs Reviewed: EKG with QTC-401. Reviewed hemoglobin A1C, TSH, lipid panel. CR historically slightly elevated and currently at 1.46 (will need PCP f/u after discharge). Repeat CBC ordered-pending   Discharge Planning: Social work and case management to assist with discharge planning and identification of hospital follow-up needs prior to discharge Estimated LOS: 5-7 days Discharge Concerns: Need to establish a safety plan; Medication compliance and effectiveness Discharge Goals: Return home with outpatient referrals for mental health follow-up including medication management/psychotherapy  Nathan Blue, NP 04/11/2022, 11:48 AMPatient ID: Nathan Holloway, male   DOB: 1996-06-01, 26 y.o.   MRN: [id number]

## 2022-04-11 NOTE — Group Note (Signed)
Recreation Therapy Group Note   Group Topic:Health and Wellness  Group Date: 04/11/2022 Start Time: 1005 End Time: 1035 Facilitators: Caroll Rancher, LRT,CTRS Location: 500 Hall Dayroom   Goal Area(s) Addresses:  Patient will define components of whole wellness. Patient will verbalize benefit of whole wellness.  Group Description:  LRT and patients discussed the three elements (mental, physical and spiritual) of wellness and how they intersect.  Patients also discuss the consequences when one of the elements is not in tune with the others.  LRT and patients then went into a series of exercises.  LRT led group in a series of stretches before allowing each group member to pick an exercise of their choosing to lead the group in.  The exercises were to take a minimum of 30 minutes. Each patient took turns leading group with their exercise.  Patients were encouraged to take breaks and get water as needed.   Affect/Mood: Appropriate   Participation Level: Engaged   Participation Quality: Independent   Behavior: Appropriate   Speech/Thought Process: Focused   Insight: Good   Judgement: Good   Modes of Intervention: Music   Patient Response to Interventions:  Engaged   Education Outcome:  Acknowledges education and In group clarification offered    Clinical Observations/Individualized Feedback: Pt was engaged and bright.  Pt was social with peers.  Pt was appropriate during group.  Pt led group in frog stretches, hip circles, running in place and push ups.  Pt left before processing and did not return.     Plan: Continue to engage patient in RT group sessions 2-3x/week.   Caroll Rancher, LRT,CTRS 04/11/2022 12:00 PM

## 2022-04-12 MED ORDER — WHITE PETROLATUM EX OINT
TOPICAL_OINTMENT | CUTANEOUS | Status: AC
  Filled 2022-04-12: qty 5

## 2022-04-12 NOTE — Progress Notes (Signed)
   04/12/22 2115  Psych Admission Type (Psych Patients Only)  Admission Status Involuntary  Psychosocial Assessment  Patient Complaints Anxiety  Eye Contact Glaring  Facial Expression Flat  Affect Anxious  Speech Pressured  Interaction Assertive  Motor Activity Pacing;Restless  Appearance/Hygiene Disheveled  Behavior Characteristics Cooperative  Mood Labile;Anxious  Aggressive Behavior  Effect No apparent injury  Thought Process  Coherency Loose associations;Disorganized  Content Magical thinking  Delusions Religious;Paranoid  Perception Derealization  Hallucination None reported or observed  Judgment Impaired  Confusion Mild  Danger to Self  Current suicidal ideation? Denies  Danger to Others  Danger to Others None reported or observed

## 2022-04-12 NOTE — Progress Notes (Signed)
Adult Psychoeducational Group Note  Date:  04/12/2022 Time:  8:35 PM  Group Topic/Focus:  Wrap-Up Group:   The focus of this group is to help patients review their daily goal of treatment and discuss progress on daily workbooks.  Participation Level:  Active  Participation Quality:  Appropriate  Affect:  Appropriate  Cognitive:  Appropriate  Insight: Appropriate  Engagement in Group:  Improving  Modes of Intervention:  Discussion  Additional Comments:  Pt stated his goal for today was to focus on his treatment plan and talk with his doctor about his discharge plan. Pt stated he accomplished his goals today. Pt stated he talked with his doctor and social worker about his care today. Pt rated his overall day a 10. Pt stated the plan is for him to discharge on 04/14/22.  Pt stated he feels much better and glad he is here getting treatment. Pt stated he was able to contact his mother, and his sister today. Pt stated he felt better about himself today.  Pt stated he took all medications provided today. Pt stated he attend all groups held today. Pt stated his appetite was pretty good today. Pt rated sleep last night was pretty good. Pt stated the goal tonight was to get some rest. Pt stated he had no physical pain today. Pt deny visual hallucinations and auditory issues tonight. Pt denies thoughts of harming himself or others. Pt stated he would alert staff if anything changed  Felipa Furnace 04/12/2022, 8:35 PM

## 2022-04-12 NOTE — Progress Notes (Signed)
   04/12/22 0800  Psych Admission Type (Psych Patients Only)  Admission Status Voluntary  Psychosocial Assessment  Patient Complaints Anxiety  Eye Contact Fair  Facial Expression Anxious  Affect Anxious  Speech Logical/coherent  Interaction Assertive  Motor Activity Pacing;Restless  Appearance/Hygiene Disheveled  Behavior Characteristics Cooperative  Mood Anxious;Labile  Aggressive Behavior  Effect No apparent injury  Thought Process  Coherency Loose associations;Disorganized  Content Magical thinking  Delusions Paranoid;Religious;Grandeur  Perception Derealization  Hallucination None reported or observed  Judgment Impaired  Confusion Mild  Danger to Self  Current suicidal ideation? Denies  Agreement Not to Harm Self Yes  Description of Agreement Verbal  Danger to Others  Danger to Others None reported or observed

## 2022-04-12 NOTE — [facility] Suicide Risk Assessment (Signed)
[facility] INPATIENT:  Family/Significant Other Suicide Prevention Education  Suicide Prevention Education:  Education Completed; Macario Shear, mother, [phone]   (name of family member/significant other) has been identified by the patient as the family member/significant other with whom the patient will be residing, and identified as the person(s) who will aid the patient in the event of a mental health crisis (suicidal ideations/suicide attempt).  With written consent from the patient, the family member/significant other has been provided the following suicide prevention education, prior to the and/or following the discharge of the patient.  Mother reports that patient was diagnosed with bipolar I disorder while living in [place] at the age of 89.  Patient states that after several hospitalizations he was finally able to stabilize on Zyprexa and depakote.  Mother states that he then started to not take his medications everyday and would take when he felt they were appropriate.  He lasted for about a year managing his meds that way before he started to decline.  Mother reports that he recently got a new car, a new apartment and a new job and was having a lot of changes and successes but feels like everything got to be too much. Patient broke down at work and then requested to go to the hospital.  Mother has no additional safety concerns and patient does not have access to guns/weapons.   The suicide prevention education provided includes the following: Suicide risk factors Suicide prevention and interventions National Suicide Hotline telephone number [facility] assessment telephone number [facility] Emergency Assistance 911 [facility] and/or Residential Mobile Crisis Unit telephone number  Request made of family/significant other to: Remove weapons (e.g., guns, rifles, knives), all items previously/currently identified as safety concern.   Remove drugs/medications  (over-the-counter, prescriptions, illicit drugs), all items previously/currently identified as a safety concern.  The family member/significant other verbalizes understanding of the suicide prevention education information provided.  The family member/significant other agrees to remove the items of safety concern listed above.  Jarome Trull E Romeka Scifres 04/12/2022, 3:24 PM

## 2022-04-12 NOTE — Progress Notes (Signed)
   04/12/22 0515  Sleep  Number of Hours 7.5

## 2022-04-12 NOTE — Progress Notes (Signed)
[facility] MD Progress Note  04/12/2022 4:02 PM Nathan Holloway  MRN:  [id number]  HPI: Nathan Holloway is a 26 yo [name] with a mental health history of bipolar 1 d/o & schizoaffective d/o who presented to the [place] behavioral health urgent care [facility]) with his mother on 8/17 with worsening psychosis. Pt was was transferred involuntarily to this [facility] for treatment and stabilization of his mental status.  24 hr chart review: Pt's V/S for the past 24 hrs were WNL. Sleep hours last night are documented as 6.25 hrs. As  per nursing documentation and reports, pt has  been appropriate on the unit and, has attended most unit group sessions. He required Atarax 25 mg last night for anxiety and Trazodone 150 mg for insomnia. He has taken all of his scheduled medications for the past 24 hrs.   Today's patient assessment note: Patient seen today for this encounter in his room on the 500 hall. Pt is calm and cooperative during this encounter,  mood is euthymic & affect is congruent. His attention to personal hygiene and grooming continues to be good, eye contact is good, speech is clear & coherent. Thought contents are organized and logical, and pt currently denies SI/HI/AVH or paranoia. There is no evidence of delusional thinking today. Pt is focused on wanting to be discharged, and has been educated that his medications are still in the process of being adjusted adjusted, and that it is important for him to remain hospitalized while medications are being adjusted so that he can be better monitored. Pt verbalizes understanding. Pt educated on the importance of benefits of a long acting injectable antipsychotic. He reports that past trials of Abilify caused a lot of nausea. He also reports that Risperdal caused worsening of his suicidal thoughts in the past and caused an anaphylactic reaction. Lorayne Bender is currently not a good option d/t anaphylaxis with Risperdal. We will discuss Haldol LAI with pt and  start this tomorrow if he is agreeable.  Pt reports a good sleep quality last night, and reports a good appetite. He denies being in any physical distress. We will continue Zyprexa 20 mg nightly. We will continue Depakene soln 500 mg BID and continue Trazodone150 mg nightly PRN for insomnia. We will continue other medications as listed below. Depakote dose to be drawn on 8/23 @ 0600. No TD/EPS type symptoms found on assessment, and pt denies any feelings of stiffness. AIMS: 0.   Principal Problem: Schizoaffective disorder, bipolar type ([place]) Diagnosis: Principal Problem:   Schizoaffective disorder, bipolar type ([place]) Active Problems:   Anxiety state   Insomnia  Total Time spent with patient: 20 minutes  Past Psychiatric History: as above  Past Medical History:  Past Medical History:  Diagnosis Date   Manic behavior ([place])    Psychotic affective disorder ([place]) 08/30/2015   History reviewed. No pertinent surgical history. Family History:  Family History  Problem Relation Age of Onset   Healthy Mother    Family Psychiatric  History: See H & P Social History:  Social History   Substance and Sexual Activity  Alcohol Use No     Social History   Substance and Sexual Activity  Drug Use Not Currently   Types: Marijuana    Social History   Socioeconomic History   Marital status: Single    Spouse name: Not on file   Number of children: Not on file   Years of education: Not on file   Highest education level: Not on  file  Occupational History   Not on file  Tobacco Use   Smoking status: Never   Smokeless tobacco: Never  Vaping Use   Vaping Use: Every day   Substances: Nicotine, Flavoring  Substance and Sexual Activity   Alcohol use: No   Drug use: Not Currently    Types: Marijuana   Sexual activity: Yes  Other Topics Concern   Not on file  Social History Narrative   Not on file   Social Determinants of Health   Financial Resource Strain: Low Risk  (09/18/2020)    Overall Financial Resource Strain (CARDIA)    Difficulty of Paying Living Expenses: Not very hard  Food Insecurity: No Food Insecurity (09/18/2020)   Hunger Vital Sign    Worried About Running Out of Food in the Last Year: Never true    Ran Out of Food in the Last Year: Never true  Transportation Needs: No Transportation Needs (09/18/2020)   PRAPARE - Administrator, Civil Service (Medical): No    Lack of Transportation (Non-Medical): No  Physical Activity: Sufficiently Active (09/18/2020)   Exercise Vital Sign    Days of Exercise per Week: 4 days    Minutes of Exercise per Session: 60 min  Stress: No Stress Concern Present (09/18/2020)   [employer] of Occupational Health - Occupational Stress Questionnaire    Feeling of Stress : Not at all  Social Connections: Socially Isolated (09/18/2020)   Social Connection and Isolation Panel [NHANES]    Frequency of Communication with Friends and Family: More than three times a week    Frequency of Social Gatherings with Friends and Family: Twice a week    Attends Religious Services: Never    Database administrator or Organizations: No    Attends Engineer, structural: Never    Marital Status: Never married   Additional Social History:   Sleep: Poor  Appetite:  Good  Current Medications: Current Facility-Administered Medications  Medication Dose Route Frequency Provider Last Rate Last Admin   acetaminophen (TYLENOL) tablet 650 mg  650 mg Oral Q6H PRN Lenard Lance, FNP   650 mg at 04/09/22 0752   alum & mag hydroxide-simeth (MAALOX/MYLANTA) 200-200-20 MG/5ML suspension 30 mL  30 mL Oral Q4H PRN Lenard Lance, FNP       benztropine (COGENTIN) tablet 1 mg  1 mg Oral BID PRN Comer Locket, MD       Or   benztropine mesylate (COGENTIN) injection 1 mg  1 mg Intramuscular BID PRN Comer Locket, MD       feeding supplement (ENSURE ENLIVE / ENSURE PLUS) liquid 237 mL  237 mL Oral BID BM Marleah Beever, NP   237 mL at  04/12/22 0824   hydrOXYzine (ATARAX) tablet 25 mg  25 mg Oral TID PRN Lenard Lance, FNP   25 mg at 04/11/22 2042   OLANZapine zydis (ZYPREXA) disintegrating tablet 5 mg  5 mg Oral Q8H PRN Lenard Lance, FNP       And   LORazepam (ATIVAN) tablet 1 mg  1 mg Oral PRN Lenard Lance, FNP       magnesium hydroxide (MILK OF MAGNESIA) suspension 30 mL  30 mL Oral Daily PRN Lenard Lance, FNP       nicotine (NICODERM CQ - dosed in mg/24 hours) patch 14 mg  14 mg Transdermal Daily Starleen Blue, NP   14 mg at 04/12/22 0823   OLANZapine zydis (ZYPREXA) disintegrating  tablet 20 mg  20 mg Oral QHS Starleen Blue, NP   20 mg at 04/11/22 2042   traZODone (DESYREL) tablet 150 mg  150 mg Oral QHS PRN Starleen Blue, NP   150 mg at 04/11/22 2042   valproic acid (DEPAKENE) 250 MG/5ML solution 500 mg  500 mg Oral BID Starleen Blue, NP   500 mg at 04/12/22 6122    Lab Results:  Results for orders placed or performed during the hospital encounter of 04/08/22 (from the past 48 hour(s))  Basic metabolic panel     Status: Abnormal   Collection Time: 04/11/22  6:28 PM  Result Value Ref Range   Sodium 139 135 - 145 mmol/L   Potassium 4.1 3.5 - 5.1 mmol/L   Chloride 101 98 - 111 mmol/L   CO2 30 22 - 32 mmol/L   Glucose, Bld 96 70 - 99 mg/dL    Comment: Glucose reference range applies only to samples taken after fasting for at least 8 hours.   BUN 18 6 - 20 mg/dL   Creatinine, Ser 4.49 (H) 0.61 - 1.24 mg/dL   Calcium 9.7 8.9 - 75.3 mg/dL   GFR, Estimated >00 >51 mL/min    Comment: (NOTE) Calculated using the CKD-EPI Creatinine Equation (2021)    Anion gap 8 5 - 15    Comment: Performed at [facility], [address], [place], [place] [address]    Blood Alcohol level:  Lab Results  Component Value Date   [facility] <10 04/07/2022   ETH <10 08/06/2020    Metabolic Disorder Labs: Lab Results  Component Value Date   HGBA1C 4.3 (L) 04/07/2022   MPG 76.71 04/07/2022   MPG 85.32 08/06/2020    Lab Results  Component Value Date   PROLACTIN 11.2 04/07/2022   PROLACTIN 3.7 (L) 07/04/2020   Lab Results  Component Value Date   CHOL 218 (H) 04/07/2022   TRIG 75 04/07/2022   HDL 80 04/07/2022   CHOLHDL 2.7 04/07/2022   VLDL 15 04/07/2022   LDLCALC 123 (H) 04/07/2022   LDLCALC 163 (H) 12/29/2020    Physical Findings: AIMS:  , ,  ,  ,    CIWA:    COWS:     Musculoskeletal: Strength & Muscle Tone: within normal limits Gait & Station: normal Patient leans: N/A  Psychiatric Specialty Exam:  Presentation  General Appearance: Appropriate for Environment; Fairly Groomed  Eye Contact:Good  Speech:Clear and Coherent  Speech Volume:Normal  Handedness:Right   Mood and Affect  Mood:sedated   Affect:Congruent   Thought Process  Thought Processes:Coherent  Descriptions of Associations:Intact  Orientation:Full (Time, Place and Person)  Thought Content:Logical  History of Schizophrenia/Schizoaffective disorder:Yes  Duration of Psychotic Symptoms:No data recorded Hallucinations:Hallucinations: None  Ideas of Reference:None  Suicidal Thoughts:Suicidal Thoughts: No  Homicidal Thoughts:Homicidal Thoughts: No   Sensorium  Memory:Immediate Good  Judgment:Good  Insight:Fair  Executive Functions  Concentration:Good  Attention Span:Good  Recall:Good  Fund of Knowledge:Fair  Language:Fair  Psychomotor Activity  Psychomotor Activity:Psychomotor Activity: Normal  Assets  Assets:Communication Skills  Sleep  Sleep:Sleep: Good  Physical Exam: Physical Exam Constitutional:      Appearance: Normal appearance.  HENT:     Head: Normocephalic.     Nose: Nose normal.  Eyes:     Pupils: Pupils are equal, round, and reactive to light.  Pulmonary:     Effort: Pulmonary effort is normal.  Musculoskeletal:        General: Normal range of motion.  Cervical back: Normal range of motion.  Neurological:     Mental Status: He is alert and  oriented to person, place, and time.     Sensory: No sensory deficit.     Coordination: Coordination normal.  Psychiatric:        Behavior: Behavior normal.        Thought Content: Thought content normal.    Review of Systems  Constitutional: Negative.   HENT: Negative.    Eyes: Negative.   Respiratory: Negative.    Cardiovascular: Negative.  Negative for chest pain.  Gastrointestinal: Negative.   Genitourinary: Negative.   Musculoskeletal: Negative.   Skin: Negative.   Neurological: Negative.   Psychiatric/Behavioral:  Negative for depression, hallucinations, memory loss, substance abuse and suicidal ideas. The patient is nervous/anxious and has insomnia.    Blood pressure 129/82, pulse 93, temperature 97.8 F (36.6 C), temperature source Oral, resp. rate 16, height 5\' 7"  (1.702 m), weight 70.2 kg, SpO2 99 %. Body mass index is 24.25 kg/m.  Treatment Plan Summary: Daily contact with patient to assess and evaluate symptoms and progress in treatment and Medication management   Observation Level/Precautions:  15 minute checks  Laboratory:  Labs reviewed   Psychotherapy:  Unit Group sessions  Medications:  See [facility]  Consultations:  To be determined   Discharge Concerns:  Safety, medication compliance, mood stability  Estimated LOS: 5-7 days  Other:  N/A    PLAN Safety and Monitoring: Voluntary admission to inpatient psychiatric unit for safety, stabilization and treatment Daily contact with patient to assess and evaluate symptoms and progress in treatment Patient's case to be discussed in multi-disciplinary team meeting Observation Level : q15 minute checks Vital signs: q12 hours Precautions: Safety   Long Term Goal(s): Improvement in symptoms so as ready for discharge   Short Term Goals: Ability to identify changes in lifestyle to reduce recurrence of condition will improve, Ability to verbalize feelings will improve, Ability to identify and develop effective coping  behaviors will improve, Compliance with prescribed medications will improve, and Ability to identify triggers associated with substance abuse/mental health issues will improve   Diagnoses  Principal Problem:   Schizoaffective disorder, bipolar type ([place]) Active Problems:   Anxiety state   Insomnia   Schizoaffective d/o -Continue Zyprexa zydis 20 mg nightly to minimize day time sedation. The benefits, rationales for LAI medications have been explained to pt who verbalized understanding. We will reinforce this to him tomorrow and do the transition to an LAI if he is agreeable to one. -Continue Depakene solution 500 mg BID for mood stabilization (will obtain level on 8/23 - CBC WNL and AST and ALT WNL on admission)   Anxiety -previously discontinued Ativan 1 mg BID d/t daytime sedation -Continue Hydroxyzine 25 mg every 6 hours PRN   Insomnia -Continue Trazodone to 150 mg nightly PRN   Agitation protocol -Continue Zyprexa/Ativan/Geodon PRN-Please see MAR   Elevated creatinine -- Encourage po fluids and recheck BMP for trending Monday - appears chronically elevated and needs PCP f/u after discharge   Other PRNS -Continue Tylenol 650 mg every 6 hours PRN for mild pain -Continue Maalox 30 mg every 4 hrs PRN for indigestion -Continue Milk of Magnesia as needed every 6 hrs for constipation   Labs Reviewed: EKG with QTC-401. Reviewed hemoglobin A1C, TSH, lipid panel. CR historically slightly elevated and currently at 1.46 (will need PCP f/u after discharge). Repeat CBC ordered-pending   Discharge Planning: Social work and case management to assist with discharge planning  and identification of hospital follow-up needs prior to discharge Estimated LOS: 5-7 days Discharge Concerns: Need to establish a safety plan; Medication compliance and effectiveness Discharge Goals: Return home with outpatient referrals for mental health follow-up including medication management/psychotherapy  Nicholes Rough, NP 04/12/2022, 4:02 PMPatient ID: Nathan Holloway, male   DOB: Feb 28, 1996, 26 y.o.   MRN: [id number] Patient ID: Nathan Holloway, male   DOB: 10-25-1995, 26 y.o.   MRN: [id number]

## 2022-04-12 NOTE — Plan of Care (Signed)
  Problem: Clinical Measurements: Goal: Ability to maintain clinical measurements within normal limits will improve Outcome: Progressing Goal: Will remain free from infection Outcome: Progressing Goal: Diagnostic test results will improve Outcome: Progressing Goal: Respiratory complications will improve Outcome: Progressing Goal: Cardiovascular complication will be avoided Outcome: Progressing   Problem: Coping: Goal: Level of anxiety will decrease Outcome: Progressing   Problem: Safety: Goal: Ability to remain free from injury will improve Outcome: Progressing   

## 2022-04-13 DIAGNOSIS — F250 Schizoaffective disorder, bipolar type: Principal | ICD-10-CM

## 2022-04-13 LAB — VALPROIC ACID LEVEL: Valproic Acid Lvl: 48 ug/mL — ABNORMAL LOW (ref 50.0–100.0)

## 2022-04-13 NOTE — Group Note (Signed)
LCSW Group Therapy Note  Group Date: 04/13/2022 Start Time: 1300 End Time: 1400   Type of Therapy and Topic:  Group Therapy: Anger Cues and Responses  Participation Level:  Active   Description of Group:   In this group, patients learned how to recognize the physical, cognitive, emotional, and behavioral responses they have to anger-provoking situations.  They identified a recent time they became angry and how they reacted.  They analyzed how their reaction was possibly beneficial and how it was possibly unhelpful.  The group discussed a variety of healthier coping skills that could help with such a situation in the future.  Focus was placed on how helpful it is to recognize the underlying emotions to our anger, because working on those can lead to a more permanent solution as well as our ability to focus on the important rather than the urgent.  Therapeutic Goals: Patients will remember their last incident of anger and how they felt emotionally and physically, what their thoughts were at the time, and how they behaved. Patients will identify how their behavior at that time worked for them, as well as how it worked against them. Patients will explore possible new behaviors to use in future anger situations. Patients will learn that anger itself is normal and cannot be eliminated, and that healthier reactions can assist with resolving conflict rather than worsening situations.  Summary of Patient Progress:  Olin was active during the group. He shared an  occurrence wherein feeling isolated and not being in control led to anger. Kamarian demonstrated good insight into the subject matter, was respectful of peers, and participated throughout the entire session.  Patient participated in group discussion and activities to identify coping mechanisms and triggers to anger.   Therapeutic Modalities:   Cognitive Behavioral Therapy    Beatris Si, LCSW 04/13/2022  2:13 PM

## 2022-04-13 NOTE — BHH Group Notes (Signed)
Adult Psychoeducational Group Note  Date:  04/13/2022 Time:  9:37 AM  Group Topic/Focus:  Goals Group:   The focus of this group is to help patients establish daily goals to achieve during treatment and discuss how the patient can incorporate goal setting into their daily lives to aide in recovery.  Participation Level:  Active  Participation Quality:  Appropriate  Additional Comments:  Pt has a goal today to follow all instructions to be able to be discharged.   Nathan Holloway 04/13/2022, 9:37 AM

## 2022-04-13 NOTE — Group Note (Signed)
Recreation Therapy Group Note   Group Topic:Team Building  Group Date: 04/13/2022 Start Time: 1000 End Time: 1045 Facilitators: Caroll Rancher, LRT,CTRS Location: [address] Dayroom   Goal Area(s) Addresses:  Patient will effectively work with peer towards shared goal.  Patient will identify skills used to make activity successful.  Patient will identify how skills used during activity can be applied to reach post d/c goals.   Group Description: [employer]. In teams of 5-6, patients were given 12 craft pipe cleaners. Using the materials provided, patients were instructed to compete again the opposing team(s) to build the tallest free-standing structure from floor level. The activity was timed; difficulty increased by Clinical research associate as Production designer, theatre/television/film continued.  Systematically resources were removed with additional directions for example, placing one arm behind their back, working in silence, and shape stipulations. LRT facilitated post-activity discussion reviewing team processes and necessary communication skills involved in completion. Patients were encouraged to reflect how the skills utilized, or not utilized, in this activity can be incorporated to positively impact support systems post discharge.   Affect/Mood: Appropriate   Participation Level: Minimal   Participation Quality: Independent   Behavior: Appropriate   Speech/Thought Process: Focused   Insight: Good   Judgement: Good   Modes of Intervention: Team-building   Patient Response to Interventions:  Attentive   Education Outcome:  Acknowledges education and In group clarification offered    Clinical Observations/Individualized Feedback: Pt was attentive to peers in group.  Pt defined teamwork as "a group of people working towards a goal together".  Pt also expressed an important aspect of teamwork is "being able to just the people you're with".  During activity, pt gave up before he started and kept  saying he wasn't good with arts and crafts.  During discussion, pt one of the things to do with your support system is to "ask for help".  Pt also explained each person has to know their role.  Pt stated he would also deal with frustration by doing breathing exercises.     Plan: Continue to engage patient in RT group sessions 2-3x/week.   Caroll Rancher, Antonietta Jewel  04/13/2022 12:43 PM

## 2022-04-13 NOTE — Progress Notes (Signed)
[facility] MD Progress Note  04/13/2022 4:02 PM Sun Afifi  MRN:  [id number]  HPI: Nathan Holloway is a 26 yo [name] with a mental health history of bipolar 1 d/o & schizoaffective d/o who presented to the [place] [facility] urgent [facility]) with his mother on 8/17 with worsening psychosis. Pt was was transferred involuntarily to this [facility] for treatment and stabilization of his mental status.  24 hr chart review: Pt's V/S for the past 24 hrs have been mostly WNL.  per nursing flow sheets, pt slept for a total of 7.5 hrs last night. As per nursing reports, pt has made tremendous improvement since being admitted to the unit. He has attended all unit group sessions for the past 24 hrs, and has been appropriate, and has actively participated. He has taken all medications scheduled in the past 24 hrs, and has not required an agitation protocol medication. He required Atarax 25 mg last night for anxiety and required Trazodone 150 mg last night for insomnia.  Today's patient assessment note: Today, pt presents with Pt with a euthymic mood & affect is congruent. His attention to personal hygiene and grooming is fair, eye contact is good, speech is clear & coherent. Thought contents are organized and logical, and pt currently denies SI/HI/AVH or paranoia. There is no evidence of delusional thoughts.    Pt reports a good sleep quality last night, and reports a good appetite. He denies being in any physical distress. There are no changes to pt's medications at this time. We will continue Zyprexa 20 mg nightly. We will also continue Depakene soln 500 mg BID and continue Trazodone150 mg nightly PRN for insomnia. We will continue other medications as listed below. Depakote level ordered to be drawn on 8/23 @ 0600, but not completed. Will reschedule this lab for tonight at 1800. No TD/EPS type symptoms found on assessment, and pt denies any feelings of stiffness. AIMS: 0. Plan is to  discharge patient home tomorrow.  Principal Problem: Schizoaffective disorder, bipolar type ([place]) Diagnosis: Principal Problem:   Schizoaffective disorder, bipolar type ([place]) Active Problems:   Anxiety state   Insomnia  Total Time spent with patient: 20 minutes  Past Psychiatric History: as above  Past Medical History:  Past Medical History:  Diagnosis Date   Manic behavior ([place])    Psychotic affective disorder ([place]) 08/30/2015   History reviewed. No pertinent surgical history. Family History:  Family History  Problem Relation Age of Onset   Healthy Mother    Family Psychiatric  History: See H & P Social History:  Social History   Substance and Sexual Activity  Alcohol Use No     Social History   Substance and Sexual Activity  Drug Use Not Currently   Types: Marijuana    Social History   Socioeconomic History   Marital status: Single    Spouse name: Not on file   Number of children: Not on file   Years of education: Not on file   Highest education level: Not on file  Occupational History   Not on file  Tobacco Use   Smoking status: Never   Smokeless tobacco: Never  Vaping Use   Vaping Use: Every day   Substances: Nicotine, Flavoring  Substance and Sexual Activity   Alcohol use: No   Drug use: Not Currently    Types: Marijuana   Sexual activity: Yes  Other Topics Concern   Not on file  Social History Narrative   Not  on file   Social Determinants of Health   Financial Resource Strain: Low Risk  (09/18/2020)   Overall Financial Resource Strain (CARDIA)    Difficulty of Paying Living Expenses: Not very hard  Food Insecurity: No Food Insecurity (09/18/2020)   Hunger Vital Sign    Worried About Running Out of Food in the Last Year: Never true    Ran Out of Food in the Last Year: Never true  Transportation Needs: No Transportation Needs (09/18/2020)   PRAPARE - Hydrologist (Medical): No    Lack of Transportation  (Non-Medical): No  Physical Activity: Sufficiently Active (09/18/2020)   Exercise Vital Sign    Days of Exercise per Week: 4 days    Minutes of Exercise per Session: 60 min  Stress: No Stress Concern Present (09/18/2020)   [employer]    Feeling of Stress : Not at all  Social Connections: Socially Isolated (09/18/2020)   Social Connection and Isolation Panel [NHANES]    Frequency of Communication with Friends and Family: More than three times a week    Frequency of Social Gatherings with Friends and Family: Twice a week    Attends Religious Services: Never    Marine scientist or Organizations: No    Attends Music therapist: Never    Marital Status: Never married   Additional Social History:   Sleep: Poor  Appetite:  Good  Current Medications: Current Facility-Administered Medications  Medication Dose Route Frequency Provider Last Rate Last Admin   acetaminophen (TYLENOL) tablet 650 mg  650 mg Oral Q6H PRN Lucky Rathke, FNP   650 mg at 04/09/22 0752   alum & mag hydroxide-simeth (MAALOX/MYLANTA) 200-200-20 MG/5ML suspension 30 mL  30 mL Oral Q4H PRN Lucky Rathke, FNP       benztropine (COGENTIN) tablet 1 mg  1 mg Oral BID PRN Harlow Asa, MD       Or   benztropine mesylate (COGENTIN) injection 1 mg  1 mg Intramuscular BID PRN Harlow Asa, MD       feeding supplement (ENSURE ENLIVE / ENSURE PLUS) liquid 237 mL  237 mL Oral BID BM Kerria Sapien, NP   237 mL at 04/13/22 1452   hydrOXYzine (ATARAX) tablet 25 mg  25 mg Oral TID PRN Lucky Rathke, FNP   25 mg at 04/12/22 2105   OLANZapine zydis (ZYPREXA) disintegrating tablet 5 mg  5 mg Oral Q8H PRN Lucky Rathke, FNP       And   LORazepam (ATIVAN) tablet 1 mg  1 mg Oral PRN Lucky Rathke, FNP       magnesium hydroxide (MILK OF MAGNESIA) suspension 30 mL  30 mL Oral Daily PRN Lucky Rathke, FNP       nicotine (NICODERM CQ - dosed in mg/24  hours) patch 14 mg  14 mg Transdermal Daily Jael Kostick, NP   14 mg at 04/13/22 0850   OLANZapine zydis (ZYPREXA) disintegrating tablet 20 mg  20 mg Oral QHS Isac Lincks, NP   20 mg at 04/12/22 2104   traZODone (DESYREL) tablet 150 mg  150 mg Oral QHS PRN Nicholes Rough, NP   150 mg at 04/12/22 2105   valproic acid (DEPAKENE) 250 MG/5ML solution 500 mg  500 mg Oral BID Nicholes Rough, NP   500 mg at 04/13/22 [id number]    Lab Results:  Results for orders placed or performed  during the hospital encounter of 04/08/22 (from the past 48 hour(s))  Basic metabolic panel     Status: Abnormal   Collection Time: 04/11/22  6:28 PM  Result Value Ref Range   Sodium 139 135 - 145 mmol/L   Potassium 4.1 3.5 - 5.1 mmol/L   Chloride 101 98 - 111 mmol/L   CO2 30 22 - 32 mmol/L   Glucose, Bld 96 70 - 99 mg/dL    Comment: Glucose reference range applies only to samples taken after fasting for at least 8 hours.   BUN 18 6 - 20 mg/dL   Creatinine, Ser 1.02 (H) 0.61 - 1.24 mg/dL   Calcium 9.7 8.9 - 72.5 mg/dL   GFR, Estimated >36 >64 mL/min    Comment: (NOTE) Calculated using the CKD-EPI Creatinine Equation (2021)    Anion gap 8 5 - 15    Comment: Performed at [facility], [address], [place], [place] [address]    Blood Alcohol level:  Lab Results  Component Value Date   [facility] <10 04/07/2022   ETH <10 08/06/2020    Metabolic Disorder Labs: Lab Results  Component Value Date   HGBA1C 4.3 (L) 04/07/2022   MPG 76.71 04/07/2022   MPG 85.32 08/06/2020   Lab Results  Component Value Date   PROLACTIN 11.2 04/07/2022   PROLACTIN 3.7 (L) 07/04/2020   Lab Results  Component Value Date   CHOL 218 (H) 04/07/2022   TRIG 75 04/07/2022   HDL 80 04/07/2022   CHOLHDL 2.7 04/07/2022   VLDL 15 04/07/2022   LDLCALC 123 (H) 04/07/2022   LDLCALC 163 (H) 12/29/2020    Physical Findings: AIMS: Facial and Oral Movements Muscles of Facial Expression: None, normal Lips and Perioral  Area: None, normal Jaw: None, normal Tongue: None, normal,Extremity Movements Upper (arms, wrists, hands, fingers): None, normal Lower (legs, knees, ankles, toes): None, normal, Trunk Movements Neck, shoulders, hips: None, normal, Overall Severity Severity of abnormal movements (highest score from questions above): None, normal Incapacitation due to abnormal movements: None, normal Patient's awareness of abnormal movements (rate only patient's report): No Awareness, Dental Status Current problems with teeth and/or dentures?: No Does patient usually wear dentures?: No  CIWA:    COWS:     Musculoskeletal: Strength & Muscle Tone: within normal limits Gait & Station: normal Patient leans: N/A  Psychiatric Specialty Exam:  Presentation  General Appearance: Appropriate for Environment; Fairly Groomed  Eye Contact:Good  Speech:Clear and Coherent  Speech Volume:Normal  Handedness:Right   Mood and Affect  Mood:sedated   Affect:Appropriate; Congruent   Thought Process  Thought Processes:Coherent  Descriptions of Associations:Intact  Orientation:Full (Time, Place and Person)  Thought Content:Logical  History of Schizophrenia/Schizoaffective disorder:Yes  Duration of Psychotic Symptoms:No data recorded Hallucinations:Hallucinations: None  Ideas of Reference:None  Suicidal Thoughts:Suicidal Thoughts: No  Homicidal Thoughts:Homicidal Thoughts: No   Sensorium  Memory:Immediate Good  Judgment:Good  Insight:Good  Executive Functions  Concentration:Good  Attention Span:Good  Recall:Good  Fund of Knowledge:Good  Language:Good  Psychomotor Activity  Psychomotor Activity:Psychomotor Activity: Normal  Assets  Assets:Communication Skills  Sleep  Sleep:Sleep: Good  Physical Exam: Physical Exam Constitutional:      Appearance: Normal appearance.  HENT:     Head: Normocephalic.     Nose: Nose normal.  Eyes:     Pupils: Pupils are equal, round, and  reactive to light.  Pulmonary:     Effort: Pulmonary effort is normal.  Musculoskeletal:        General: Normal range  of motion.     Cervical back: Normal range of motion.  Neurological:     Mental Status: He is alert and oriented to person, place, and time.     Sensory: No sensory deficit.     Coordination: Coordination normal.  Psychiatric:        Behavior: Behavior normal.        Thought Content: Thought content normal.    Review of Systems  Constitutional: Negative.   HENT: Negative.    Eyes: Negative.   Respiratory: Negative.    Cardiovascular: Negative.  Negative for chest pain.  Gastrointestinal: Negative.   Genitourinary: Negative.   Musculoskeletal: Negative.   Skin: Negative.   Neurological: Negative.   Psychiatric/Behavioral:  Negative for depression, hallucinations, memory loss, substance abuse and suicidal ideas. The patient is nervous/anxious and has insomnia.    Blood pressure 113/82, pulse 66, temperature (!) 97.3 F (36.3 C), temperature source Oral, resp. rate 16, height 5\' 7"  (1.702 m), weight 70.2 kg, SpO2 100 %. Body mass index is 24.25 kg/m.  Treatment Plan Summary: Daily contact with patient to assess and evaluate symptoms and progress in treatment and Medication management   Observation Level/Precautions:  15 minute checks  Laboratory:  Labs reviewed   Psychotherapy:  Unit Group sessions  Medications:  See [facility]  Consultations:  To be determined   Discharge Concerns:  Safety, medication compliance, mood stability  Estimated LOS: 5-7 days  Other:  N/A    PLAN Safety and Monitoring: Voluntary admission to inpatient psychiatric unit for safety, stabilization and treatment Daily contact with patient to assess and evaluate symptoms and progress in treatment Patient's case to be discussed in multi-disciplinary team meeting Observation Level : q15 minute checks Vital signs: q12 hours Precautions: Safety   Long Term Goal(s): Improvement in symptoms  so as ready for discharge   Short Term Goals: Ability to identify changes in lifestyle to reduce recurrence of condition will improve, Ability to verbalize feelings will improve, Ability to identify and develop effective coping behaviors will improve, Compliance with prescribed medications will improve, and Ability to identify triggers associated with substance abuse/mental health issues will improve   Diagnoses  Principal Problem:   Schizoaffective disorder, bipolar type ([place]) Active Problems:   Anxiety state   Insomnia   Schizoaffective d/o -There are no changes to medications plan at this time -Continue Zyprexa zydis 20 mg nightly. The benefits, rationales for LAI medications have been explained to pt and he has declined long acting injectable medications at this time. -Continue Depakene solution 500 mg BID for mood stabilization (will obtain level on 8/23 - CBC WNL and AST and ALT WNL on admission)   Anxiety -previously discontinued Ativan 1 mg BID d/t daytime sedation -Continue Hydroxyzine 25 mg every 6 hours PRN   Insomnia -Continue Trazodone to 150 mg nightly PRN   Agitation protocol -Continue Zyprexa/Ativan/Geodon PRN-Please see MAR   Elevated creatinine -- Encourage po fluids and recheck BMP for trending Monday - appears chronically elevated and needs PCP f/u after discharge   Other PRNS -Continue Tylenol 650 mg every 6 hours PRN for mild pain -Continue Maalox 30 mg every 4 hrs PRN for indigestion -Continue Milk of Magnesia as needed every 6 hrs for constipation   Labs Reviewed: EKG with QTC-401. Reviewed hemoglobin A1C, TSH, lipid panel. CR historically slightly elevated and currently at 1.46 (will need PCP f/u after discharge). Repeat CBC ordered-pending   Discharge Planning: Social work and case management to assist with discharge planning and  identification of hospital follow-up needs prior to discharge Estimated LOS: 5-7 days Discharge Concerns: Need to establish a  safety plan; Medication compliance and effectiveness Discharge Goals: Return home with outpatient referrals for mental health follow-up including medication management/psychotherapy  Starleen Blue, NP 04/13/2022, 4:02 PMPatient ID: [name], male   DOB: 12-13-1995, 26 y.o.   MRN: [id number] Patient ID: Caidon Foti, male   DOB: 05-May-1996, 26 y.o.

## 2022-04-13 NOTE — Progress Notes (Signed)
   04/13/22 0515  Sleep  Number of Hours 7.5

## 2022-04-13 NOTE — BHH Group Notes (Signed)
Adult Psychoeducational Group Note  Date:  04/13/2022 Time:  8:21 PM  Group Topic/Focus:  Healthy Communication:   The focus of this group is to discuss communication, barriers to communication, as well as healthy ways to communicate with others.  Participation Level:  Active  Participation Quality:  Appropriate and Attentive  Affect:  Appropriate  Cognitive:  Alert  Insight: Good  Engagement in Group:  Engaged  Modes of Intervention:  Discussion  Additional Comments  Jacalyn Lefevre 04/13/2022, 8:21 PM

## 2022-04-13 NOTE — Progress Notes (Addendum)
Pt reports he slept well last night with good appetite and better mood. Presents animated with fixed smile, logical, soft speech, fair eye contact and is ambulatory in milieu with a steady gait. Denies SI, HI, AVH and pain "Not right now" when assessed. Preoccupied about discharge. Per pt I just need to go home and get back to work. I work for a [employer] and I'm in school for cyber security so I don't want to fall behind". Visible in scheduled groups, off unit for meals and leisure break, returned without issues. Concerns validated. Support, encouragement and reassurance provided to pt. Safety checks maintained at Q 15 minutes intervals without incident. Pt is compliant with his current medication regimen without adverse reactions. Tolerates all PO intake well without discomfort. Cooperative with unit routines and interacts well with peers and staff.

## 2022-04-13 NOTE — BHH Counselor (Signed)
CSW met with patient and provided information and education on how to apply for disability.  Patient demonstrated understanding. Patient advocating for him to be discharged today.  CSW encouraged to speak to doctor about next steps to make sure he is on right track for discharge.    Kadelyn Dimascio, LCSW, [place] Social Worker  [facility]

## 2022-04-13 NOTE — Progress Notes (Signed)
   04/13/22 2015  Psych Admission Type (Psych Patients Only)  Admission Status Involuntary  Psychosocial Assessment  Patient Complaints None  Eye Contact Glaring  Facial Expression Flat  Affect Anxious  Speech Pressured  Interaction Assertive  Motor Activity Pacing;Restless  Appearance/Hygiene Disheveled  Behavior Characteristics Cooperative  Mood Anxious;Pleasant  Aggressive Behavior  Effect No apparent injury  Thought Process  Coherency Loose associations;Disorganized  Content Magical thinking  Delusions Religious;Paranoid  Perception Derealization  Hallucination None reported or observed  Judgment Impaired  Confusion Mild  Danger to Self  Current suicidal ideation? Denies  Danger to Others  Danger to Others None reported or observed

## 2022-04-14 MED ORDER — TRAZODONE HCL 150 MG PO TABS: 150.0000 mg | ORAL_TABLET | Freq: Every evening | ORAL | 0 refills | Status: DC | PRN

## 2022-04-14 MED ORDER — NICOTINE 14 MG/24HR TD PT24: 14.0000 mg | MEDICATED_PATCH | Freq: Every day | TRANSDERMAL | 0 refills | Status: DC

## 2022-04-14 MED ORDER — OLANZAPINE 20 MG PO TABS: 20.0000 mg | ORAL_TABLET | Freq: Every day | ORAL | 0 refills | Status: DC

## 2022-04-14 MED ORDER — HYDROXYZINE HCL 25 MG PO TABS
25.0000 mg | ORAL_TABLET | Freq: Three times a day (TID) | ORAL | 0 refills | Status: DC | PRN
Start: 2022-04-14 — End: 2022-05-03

## 2022-04-14 MED ORDER — VALPROIC ACID 250 MG/5ML PO SOLN
500.0000 mg | Freq: Two times a day (BID) | ORAL | 0 refills | Status: DC
Start: 2022-04-14 — End: 2022-05-03

## 2022-04-14 MED ORDER — OLANZAPINE 10 MG PO TABS
20.0000 mg | ORAL_TABLET | Freq: Every day | ORAL | Status: DC
  Filled 2022-04-14 (×2): qty 2

## 2022-04-14 NOTE — Progress Notes (Signed)
Adult Psychoeducational Group Note  Date:  04/14/2022 Time:  9:29 AM  Group Topic/Focus:  Goals Group:   The focus of this group is to help patients establish daily goals to achieve during treatment and discuss how the patient can incorporate goal setting into their daily lives to aide in recovery.  Participation Level:  Active  Participation Quality:  Appropriate  Affect:  Appropriate  Cognitive:  Appropriate  Insight: Appropriate  Engagement in Group:  Engaged  Modes of Intervention:  Discussion  Additional Comments: Patient attended morning orientation/goal setting group and said his goal for today is to following instructions.   Elihu Milstein W Bonnye Halle 04/14/2022, 9:29 AM

## 2022-04-14 NOTE — Progress Notes (Signed)
   04/14/22 0500  Sleep  Number of Hours 8

## 2022-04-14 NOTE — Progress Notes (Signed)
D: Pt A & O X 3. Denies SI, HI, AVH and pain at this time. D/C home as ordered. Picked up in lobby by his mother. A: D/C instructions reviewed with pt and mother including prescriptions and follow up appointment' compliance encouraged. All belongings from locker 11 returned to pt at time of departure. Scheduled medications given with verbal education and effects monitored. Safety checks maintained without incident till time of d/c.  R: Pt receptive to care. Compliant with medications when offered. Denies adverse drug reactions when assessed. Verbalized understanding related to d/c instructions. Signed belonging sheet in agreement with items received from locker. Ambulatory with a steady gait. Appears to be in no physical distress at time of departure.

## 2022-04-14 NOTE — Discharge Summary (Signed)
Physician Discharge Summary Note  Patient:  Nathan Holloway is an 26 y.o., male MRN:  [id number] DOB:  [birth year] Patient phone:  [phone] (home)  Patient address:   [address] [place] [address],  Total Time spent with patient: 45 minutes  Date of Admission:  04/08/2022 Date of Discharge: 04/14/2022  Reason for Admission:  Ulric Usey is a 26 yo [name] with a mental health history of bipolar 1 d/o & schizoaffective d/o who presented to the [place] behavioral health urgent care [facility]) with his mother on 8/17 with worsening psychosis. Pt was was transferred involuntarily to this [facility] for treatment and stabilization of his mental status.  Principal Problem: Schizoaffective disorder, bipolar type [facility]) Discharge Diagnoses: Principal Problem:   Schizoaffective disorder, bipolar type ([place]) Active Problems:   Anxiety state   Insomnia   Past Psychiatric History: Schizoaffective d/o; Admitted to [facility] last in December 2021 - previous med trials include VPA and Zyprexa, Trazodone, Abilify, Latuda, Ambien per chart review  Past Medical History:  Past Medical History:  Diagnosis Date   Manic behavior ([place])    Psychotic affective disorder ([place]) 08/30/2015   History reviewed. No pertinent surgical history. Family History:  Family History  Problem Relation Age of Onset   Healthy Mother    Family Psychiatric  History: Pt reports OCD and bipolar d/o in mother and bipolar d/o in sister. Denies suicides in the family Social History:  Social History   Substance and Sexual Activity  Alcohol Use No     Social History   Substance and Sexual Activity  Drug Use Not Currently   Types: Marijuana    Social History   Socioeconomic History   Marital status: Single    Spouse name: Not on file   Number of children: Not on file   Years of education: Not on file   Highest education level: Not on file  Occupational History   Not on file  Tobacco Use    Smoking status: Never   Smokeless tobacco: Never  Vaping Use   Vaping Use: Every day   Substances: Nicotine, Flavoring  Substance and Sexual Activity   Alcohol use: No   Drug use: Not Currently    Types: Marijuana   Sexual activity: Yes  Other Topics Concern   Not on file  Social History Narrative   Not on file   Social Determinants of Health   Financial Resource Strain: Low Risk  (09/18/2020)   Overall Financial Resource Strain (CARDIA)    Difficulty of Paying Living Expenses: Not very hard  Food Insecurity: No Food Insecurity (09/18/2020)   Hunger Vital Sign    Worried About Running Out of Food in the Last Year: Never true    Ran Out of Food in the Last Year: Never true  Transportation Needs: No Transportation Needs (09/18/2020)   PRAPARE - Hydrologist (Medical): No    Lack of Transportation (Non-Medical): No  Physical Activity: Sufficiently Active (09/18/2020)   Exercise Vital Sign    Days of Exercise per Week: 4 days    Minutes of Exercise per Session: 60 min  Stress: No Stress Concern Present (09/18/2020)   [place]    Feeling of Stress : Not at all  Social Connections: Socially Isolated (09/18/2020)   Social Connection and Isolation Panel [NHANES]    Frequency of Communication with Friends and Family: More than three times a week  Frequency of Social Gatherings with Friends and Family: Twice a week    Attends Religious Services: Never    Marine scientist or Organizations: No    Attends Music therapist: Never    Marital Status: Never married    Hospital Course:  During the patient's hospitalization, patient had extensive initial psychiatric evaluation, and follow-up psychiatric evaluations every day.   Psychiatric diagnoses provided upon initial assessment:  Principal Problem:   Schizoaffective disorder, bipolar type ([place]) Active Problems:   Anxiety  state   Insomnia   Patient's psychiatric medications were adjusted on admission: Started Zyprexa Zydis 10 mg in the morning and 50 mg at bedtime for psychosis, started Depakene solution 500 mg twice daily for mood stabilization, started Ativan 1 mg twice daily for anxiety and continued hydroxyzine as needed for anxiety, started trazodone 50 mg at bedtime as needed for sleep.   During the hospitalization, other adjustments were made to the patient's psychiatric medication regimen: Zyprexa dose was adjusted to 20 mg total at bedtime for mood and psychosis, Depakene was continued liquid form 500 mg twice daily for mood stabilization, trazodone was titrated up to 150 mg at bedtime as needed for sleep with good efficacy reported.   Patient's care was discussed during the interdisciplinary team meeting every day during the hospitalization.   The patient denied having side effects to prescribed psychiatric medication.   Gradually, patient started adjusting to milieu. The patient was evaluated each day by a clinical provider to ascertain response to treatment. Improvement was noted by the patient's report of decreasing symptoms, improved sleep and appetite, affect, medication tolerance, behavior, and participation in unit programming.  Patient was asked each day to complete a self inventory noting mood, mental status, pain, new symptoms, anxiety and concerns.     Symptoms were reported as significantly decreased or resolved completely by discharge.  During hospital stay discussed with patient in multiple occasions regarding LAI, he reported history of side effect to Abilify and refused to get back on it, he has allergy listed to risperidone and he reports it was in the form of anaphylaxis which makes him not suitable for risperidone constant or [place], discussed with patient potential to switch him to Haldol or Prolixin but he refused, patient being discharged on Zyprexa p.o., discussed with patient  importance to comply to avoid decompensation and rehospitalization, he agrees to comply with medication and follow-up appointment at time of discharge.   On day of discharge, patient was evaluated on 04/14/2022 the patient reports that their mood is stable. The patient denied having suicidal thoughts for more than 48 hours prior to discharge.  Patient denies having homicidal thoughts.  Patient denies having auditory hallucinations.  Patient denies any visual hallucinations or other symptoms of psychosis. The patient was motivated to continue taking medication with a goal of continued improvement in mental health.  Reports significant improvement in regard to thought disorder since admission reports able to have linear thoughts "organized than normal slowed down no intrusive thoughts" agreeing to comply with medication after discharge and in fact able to listen for me with the right dosing regimen.  Able to identify risk of noncompliance as "will come back to the hospital will not be well"   The patient reports their target psychiatric symptoms of psychosis, disorganized thought process, unstable mood/hyper responded well to the psychiatric medications, and the patient reports overall benefit other psychiatric hospitalization. Supportive psychotherapy was provided to the patient. The patient also participated in regular  group therapy while hospitalized. Coping skills, problem solving as well as relaxation therapies were also part of the unit programming.   Labs were reviewed with the patient, and abnormal results were discussed with the patient.   The patient is able to verbalize their individual safety plan to this provider.   Behavioral Events: None   Restraints: None   Groups: Attended and participated   Medications Changes: As noted above   D/C Medications: Zyprexa 20 mg at bedtime for mood and psychosis, Depakene liquid 500 mg twice daily for mood stabilization, NicoDerm patch for nicotine  dependence, Atarax 25 mg as needed for anxiety, trazodone 150 mg at bedtime as needed for sleep   Sleep  Sleep:Sleep: Good  Physical Findings: AIMS: Facial and Oral Movements Muscles of Facial Expression: None, normal Lips and Perioral Area: None, normal Jaw: None, normal Tongue: None, normal,Extremity Movements Upper (arms, wrists, hands, fingers): None, normal Lower (legs, knees, ankles, toes): None, normal, Trunk Movements Neck, shoulders, hips: None, normal, Overall Severity Severity of abnormal movements (highest score from questions above): None, normal Incapacitation due to abnormal movements: None, normal Patient's awareness of abnormal movements (rate only patient's report): No Awareness, Dental Status Current problems with teeth and/or dentures?: No Does patient usually wear dentures?: No  CIWA:    COWS:     Musculoskeletal: Strength & Muscle Tone: within normal limits Gait & Station: normal Patient leans: N/A   Psychiatric Specialty Exam:  General Appearance: appears at stated age, fairly dressed and groomed  Behavior: pleasant and cooperative  Psychomotor Activity:No psychomotor agitation or retardation noted   Eye Contact: good Speech: normal amount, tone, volume and latency   Mood: euthymic Affect: congruent, pleasant and interactive  Thought Process: linear, goal directed, no circumstantial or tangential thought process noted, no racing thoughts or flight of ideas Descriptions of Associations: intact Thought Content: Hallucinations: denies AH, VH , does not appear responding to stimuli Delusions: No paranoia or other delusions noted Suicidal Thoughts: denies SI, intention, plan  Homicidal Thoughts: denies HI, intention, plan   Alertness/Orientation: alert and fully oriented  Insight: fair, improved Judgment: fair, improved  Memory: intact  Executive Functions  Concentration: intact  Attention Span: Fair Recall: intact Fund of Knowledge:  fair   Assets  Assets:Communication Skills   Sleep Sleep:Sleep: Good    Physical Exam:  Physical Exam Constitutional:      Appearance: Normal appearance.  HENT:     Head: Normocephalic and atraumatic.  Pulmonary:     Effort: Pulmonary effort is normal.  Musculoskeletal:        General: Normal range of motion.  Skin:    General: Skin is warm and dry.  Neurological:     Mental Status: He is alert.    Review of Systems  All other systems reviewed and are negative.  Blood pressure 122/87, pulse 75, temperature 97.7 F (36.5 C), temperature source Oral, resp. rate 16, height 5\' 7"  (1.702 m), weight 70.2 kg, SpO2 100 %. Body mass index is 24.25 kg/m.   Social History   Tobacco Use  Smoking Status Never  Smokeless Tobacco Never   Tobacco Cessation:  A prescription for an FDA-approved tobacco cessation medication provided at discharge   Blood Alcohol level:  Lab Results  Component Value Date   [facility] <10 04/07/2022   ETH <10 08/06/2020    Metabolic Disorder Labs:  Lab Results  Component Value Date   HGBA1C 4.3 (L) 04/07/2022   MPG 76.71 04/07/2022   MPG 85.32 08/06/2020  Lab Results  Component Value Date   PROLACTIN 11.2 04/07/2022   PROLACTIN 3.7 (L) 07/04/2020   Lab Results  Component Value Date   CHOL 218 (H) 04/07/2022   TRIG 75 04/07/2022   HDL 80 04/07/2022   CHOLHDL 2.7 04/07/2022   VLDL 15 04/07/2022   LDLCALC 123 (H) 04/07/2022   LDLCALC 163 (H) 12/29/2020    See Psychiatric Specialty Exam and Suicide Risk Assessment completed by Attending Physician prior to discharge.  Discharge destination:  Home  Is patient on multiple antipsychotic therapies at discharge:  No   Has Patient had three or more failed trials of antipsychotic monotherapy by history:  No  Recommended Plan for Multiple Antipsychotic Therapies: NA  Discharge Instructions     Diet - low sodium heart healthy   Complete by: As directed    Increase activity slowly    Complete by: As directed       Allergies as of 04/14/2022       Reactions   Risperidone And Related Other (See Comments)   Diastatic   Milk (cow) Other (See Comments)   GI Upset   Pork-derived Products Other (See Comments)   GI Upset   Risperdal [risperidone] Other (See Comments)   Patient states he almost died taking risperdal. Unable to give specific reactions        Medication List     TAKE these medications      Indication  hydrOXYzine 25 MG tablet Commonly known as: ATARAX Take 1 tablet (25 mg total) by mouth 3 (three) times daily as needed for anxiety.  Indication: Feeling Anxious   nicotine 14 mg/24hr patch Commonly known as: NICODERM CQ - dosed in mg/24 hours Place 1 patch (14 mg total) onto the skin daily. Start taking on: April 15, 2022  Indication: Nicotine Addiction   OLANZapine 20 MG tablet Commonly known as: ZYPREXA Take 1 tablet (20 mg total) by mouth at bedtime. What changed:  medication strength how much to take  Indication: MIXED BIPOLAR AFFECTIVE DISORDER   traZODone 150 MG tablet Commonly known as: DESYREL Take 1 tablet (150 mg total) by mouth at bedtime as needed for sleep.  Indication: Trouble Sleeping   valproic acid 250 MG/5ML solution Commonly known as: DEPAKENE Take 10 mLs (500 mg total) by mouth 2 (two) times daily.  Indication: Manic-Depression        Follow-up Information     BEHAVIORAL HEALTH OUTPATIENT THERAPY [place]. Go on 05/03/2022.   Specialty: Behavioral Health Why: You have an appointment on 05/03/22 at 9:30 am for medication management services.  This appointment will be held in person with Dr. Mercy Riding.  You have an appointment for therapy services on 05/17/22 at 8:00 am (you are on a cancellation list for a sooner appt). This will be a Forensic psychologist appointment via MyChart. Contact information: [phone] mc [place] [address] [phone]                 Discharge recommendations:   Activity: as tolerated  Diet: heart healthy  # It is recommended to the patient to continue psychiatric medications as prescribed, after discharge from the hospital.     # It is recommended to the patient to follow up with your outpatient psychiatric provider and PCP.   # It was discussed with the patient, the impact of alcohol, drugs, tobacco have been there overall psychiatric and medical wellbeing, and total abstinence from substance use was recommended the patient.ed.   #  Prescriptions provided or sent directly to preferred pharmacy at discharge. Patient agreeable to plan. Given opportunity to ask questions. Appears to feel comfortable with discharge.    # In the event of worsening symptoms, the patient is instructed to call the crisis hotline, 911 and or go to the nearest ED for appropriate evaluation and treatment of symptoms. To follow-up with primary care provider for other medical issues, concerns and or health care needs   # Patient was discharged home with a plan to follow up as noted above.   Patient agrees with D/C instructions and plan.   The patient received suicide prevention pamphlet:  Yes Belongings returned:  Clothing and Valuables  Total Time Spent in Direct Patient Care:  I personally spent 45 minutes on the unit in direct patient care. The direct patient care time included face-to-face time with the patient, reviewing the patient's chart, communicating with other professionals, and coordinating care. Greater than 50% of this time was spent in counseling or coordinating care with the patient regarding goals of hospitalization, psycho-education, and discharge planning needs.    SignedDian Situ, MD 04/14/2022, 10:34 AM

## 2022-04-14 NOTE — Progress Notes (Signed)
  [facility] Adult Case Management Discharge Plan :  Will you be returning to the same living situation after discharge:  Yes,  patient will be staying with mother and then returning back to apartment At discharge, do you have transportation home?: Yes,  mother will pick patient up Do you have the ability to pay for your medications: Yes,  insurance   Release of information consent forms completed and in the chart;  Patient's signature needed at discharge.  Patient to Follow up at:  Follow-up Information     BEHAVIORAL HEALTH OUTPATIENT THERAPY [place]. Go on 05/03/2022.   Specialty: Behavioral Health Why: You have an appointment on 05/03/22 at 9:30 am for medication management services.  This appointment will be held in person with Dr. Mercy Riding.  You have an appointment for therapy services on 05/17/22 at 8:00 am (you are on a cancellation list for a sooner appt). This will be a Forensic psychologist appointment via MyChart. Contact information: [address] [phone] mc [place] [address] [phone]                Next level of care provider has access to [facility] Link:yes  Safety Planning and Suicide Prevention discussed: Yes,  mother , Marcellous Snarski, [phone]     Has patient been referred to the Quitline?: N/A patient is not a smoker  Patient has been referred for addiction treatment: N/A  Gardiner Sleeper Maedell Hedger, LCSW 04/14/2022, 10:43 AM

## 2022-04-14 NOTE — [facility] Suicide Risk Assessment (Signed)
[facility] Discharge Suicide Risk Assessment   Principal Problem: Schizoaffective disorder, bipolar type [facility]) Discharge Diagnoses: Principal Problem:   Schizoaffective disorder, bipolar type (HCC) Active Problems:   Anxiety state   Insomnia   Total Time spent with patient: 45 minutes  Reason for admission: Nathan Holloway is a 26 yo [place] American male with a mental health history of bipolar 1 d/o & schizoaffective d/o who presented to the [place] [facility] urgent [facility]) with his mother on 8/17 with worsening psychosis. Pt was was transferred involuntarily to this [facility] for treatment and stabilization of his mental status.  PTA Medications:  Medications Prior to Admission  Medication Sig Dispense Refill Last Dose   OLANZapine (ZYPREXA) 15 MG tablet Take 1 tablet (15 mg total) by mouth at bedtime. (Patient taking differently: Take 7.5 mg by mouth at bedtime.)       Hospital Course:   During the patient's hospitalization, patient had extensive initial psychiatric evaluation, and follow-up psychiatric evaluations every day.  Psychiatric diagnoses provided upon initial assessment:  Principal Problem:   Schizoaffective disorder, bipolar type (HCC) Active Problems:   Anxiety state   Insomnia  Patient's psychiatric medications were adjusted on admission: Started Zyprexa Zydis 10 mg in the morning and 50 mg at bedtime for psychosis, started Depakene solution 500 mg twice daily for mood stabilization, started Ativan 1 mg twice daily for anxiety and continued hydroxyzine as needed for anxiety, started trazodone 50 mg at bedtime as needed for sleep.  During the hospitalization, other adjustments were made to the patient's psychiatric medication regimen: Zyprexa dose was adjusted to 20 mg total at bedtime for mood and psychosis, Depakene was continued liquid form 500 mg twice daily for mood stabilization, trazodone was titrated up to 150 mg at bedtime as needed for sleep with  good efficacy reported.  Patient's care was discussed during the interdisciplinary team meeting every day during the hospitalization.  The patient denied having side effects to prescribed psychiatric medication.  Gradually, patient started adjusting to milieu. The patient was evaluated each day by a clinical provider to ascertain response to treatment. Improvement was noted by the patient's report of decreasing symptoms, improved sleep and appetite, affect, medication tolerance, behavior, and participation in unit programming.  Patient was asked each day to complete a self inventory noting mood, mental status, pain, new symptoms, anxiety and concerns.    Symptoms were reported as significantly decreased or resolved completely by discharge.  During hospital stay discussed with patient in multiple occasions regarding LAI, he reported history of side effect to Abilify and refused to get back on it, he has allergy listed to risperidone and he reports it was in the form of anaphylaxis which makes him not suitable for risperidone constant or [place], discussed with patient potential to switch him to Haldol or Prolixin but he refused, patient being discharged on Zyprexa p.o., discussed with patient importance to comply to avoid decompensation and rehospitalization, he agrees to comply with medication and follow-up appointment at time of discharge.  On day of discharge, patient was evaluated on 04/14/2022 the patient reports that their mood is stable. The patient denied having suicidal thoughts for more than 48 hours prior to discharge.  Patient denies having homicidal thoughts.  Patient denies having auditory hallucinations.  Patient denies any visual hallucinations or other symptoms of psychosis. The patient was motivated to continue taking medication with a goal of continued improvement in mental health.  Reports significant improvement in regard to thought disorder since  admission reports able to have  linear thoughts "organized than normal slowed down no intrusive thoughts" agreeing to comply with medication after discharge and in fact able to listen for me with the right dosing regimen.  Able to identify risk of noncompliance as "will come back to the hospital will not be well"  The patient reports their target psychiatric symptoms of psychosis, disorganized thought process, unstable mood/hyper responded well to the psychiatric medications, and the patient reports overall benefit other psychiatric hospitalization. Supportive psychotherapy was provided to the patient. The patient also participated in regular group therapy while hospitalized. Coping skills, problem solving as well as relaxation therapies were also part of the unit programming.  Labs were reviewed with the patient, and abnormal results were discussed with the patient.  The patient is able to verbalize their individual safety plan to this provider.  Behavioral Events: None  Restraints: None  Groups: Attended and participated  Medications Changes: As noted above  D/C Medications: Zyprexa 20 mg at bedtime for mood and psychosis, Depakene liquid 500 mg twice daily for mood stabilization, NicoDerm patch for nicotine dependence, Atarax 25 mg as needed for anxiety, trazodone 150 mg at bedtime as needed for sleep  Sleep  Sleep:Sleep: Good   Musculoskeletal: Strength & Muscle Tone: within normal limits Gait & Station: normal Patient leans: N/A  Psychiatric Specialty Exam  General Appearance: appears at stated age, fairly dressed and groomed  Behavior: pleasant and cooperative  Psychomotor Activity:No psychomotor agitation or retardation noted   Eye Contact: good Speech: normal amount, tone, volume and latency   Mood: euthymic Affect: congruent, pleasant and interactive  Thought Process: linear, goal directed, no circumstantial or tangential thought process noted, no racing thoughts or flight of ideas Descriptions  of Associations: intact Thought Content: Hallucinations: denies AH, VH , does not appear responding to stimuli Delusions: No paranoia or other delusions noted Suicidal Thoughts: denies SI, intention, plan  Homicidal Thoughts: denies HI, intention, plan   Alertness/Orientation: alert and fully oriented  Insight: fair, improved Judgment: fair, improved  Memory: intact  Executive Functions  Concentration: intact  Attention Span: Fair Recall: intact Fund of Knowledge: fair   Art therapist  Concentration: intact Attention Span: Fair Recall: intact Fund of Knowledge: fair   Assets  Assets:Communication Skills   Physical Exam: Physical Exam ROS Blood pressure 122/87, pulse 75, temperature 97.7 F (36.5 C), temperature source Oral, resp. rate 16, height 5\' 7"  (1.702 m), weight 70.2 kg, SpO2 100 %. Body mass index is 24.25 kg/m.  Mental Status Per Nursing Assessment::   On Admission:  NA  Demographic Factors:  Male, Adolescent or young adult, and Unemployed  Loss Factors: NA  Historical Factors: NA  Risk Reduction Factors:   Living with another person, especially a relative, Positive social support, and Positive coping skills or problem solving skills  Continued Clinical Symptoms: Improved significantly during hospital stay Bipolar Disorder:   Mixed State Schizophrenia:   Paranoid or undifferentiated type  Cognitive Features That Contribute To Risk:  None    Suicide Risk:  Minimal: No identifiable suicidal ideation.  Patients presenting with no risk factors but with morbid ruminations; may be classified as minimal risk based on the severity of the depressive symptoms   Follow-up Information     [facility] [place]. Go on 05/03/2022.   Specialty: Behavioral Health Why: You have an appointment on 05/03/22 at 9:30 am for medication management services.  This appointment will be held in person with Dr. 07/03/22.  You  have an  appointment for therapy services on 05/17/22 at 8:00 am (you are on a cancellation list for a sooner appt). This will be a Forensic psychologist appointment via MyChart. Contact information: [address] [id number] mc [place] [address] [phone]                Plan Of Care/Follow-up recommendations:    Discharge recommendations:    Activity: as tolerated  Diet: heart healthy  # It is recommended to the patient to continue psychiatric medications as prescribed, after discharge from the hospital.     # It is recommended to the patient to follow up with your outpatient psychiatric provider and PCP.   # It was discussed with the patient, the impact of alcohol, drugs, tobacco have been there overall psychiatric and medical wellbeing, and total abstinence from substance use was recommended the patient.ed.   # Prescriptions provided or sent directly to preferred pharmacy at discharge. Patient agreeable to plan. Given opportunity to ask questions. Appears to feel comfortable with discharge.    # In the event of worsening symptoms, the patient is instructed to call the crisis hotline, 911 and or go to the nearest ED for appropriate evaluation and treatment of symptoms. To follow-up with primary care provider for other medical issues, concerns and or health care needs   # Patient was discharged home with a plan to follow up as noted above.   Patient agrees with D/C instructions and plan.  The patient received suicide prevention pamphlet:  Yes Belongings returned:  Clothing and Valuables  Total Time Spent in Direct Patient Care:  I personally spent 45 minutes on the unit in direct patient care. The direct patient care time included face-to-face time with the patient, reviewing the patient's chart, communicating with other professionals, and coordinating care. Greater than 50% of this time was spent in counseling or coordinating care with the patient regarding  goals of hospitalization, psycho-education, and discharge planning needs.   Maridel Pixler 04/14/2022, 10:21 AM   Sophie Tamez Abbott Pao, MD 04/14/2022, 10:21 AM

## 2022-04-20 IMAGING — CR DG CHEST 2V
2 series · 2 of 2 positions shown · non-contrast
Comparison: None.

CLINICAL DATA: Central chest pain.

EXAM:
CHEST - 2 VIEW

[chest pa]
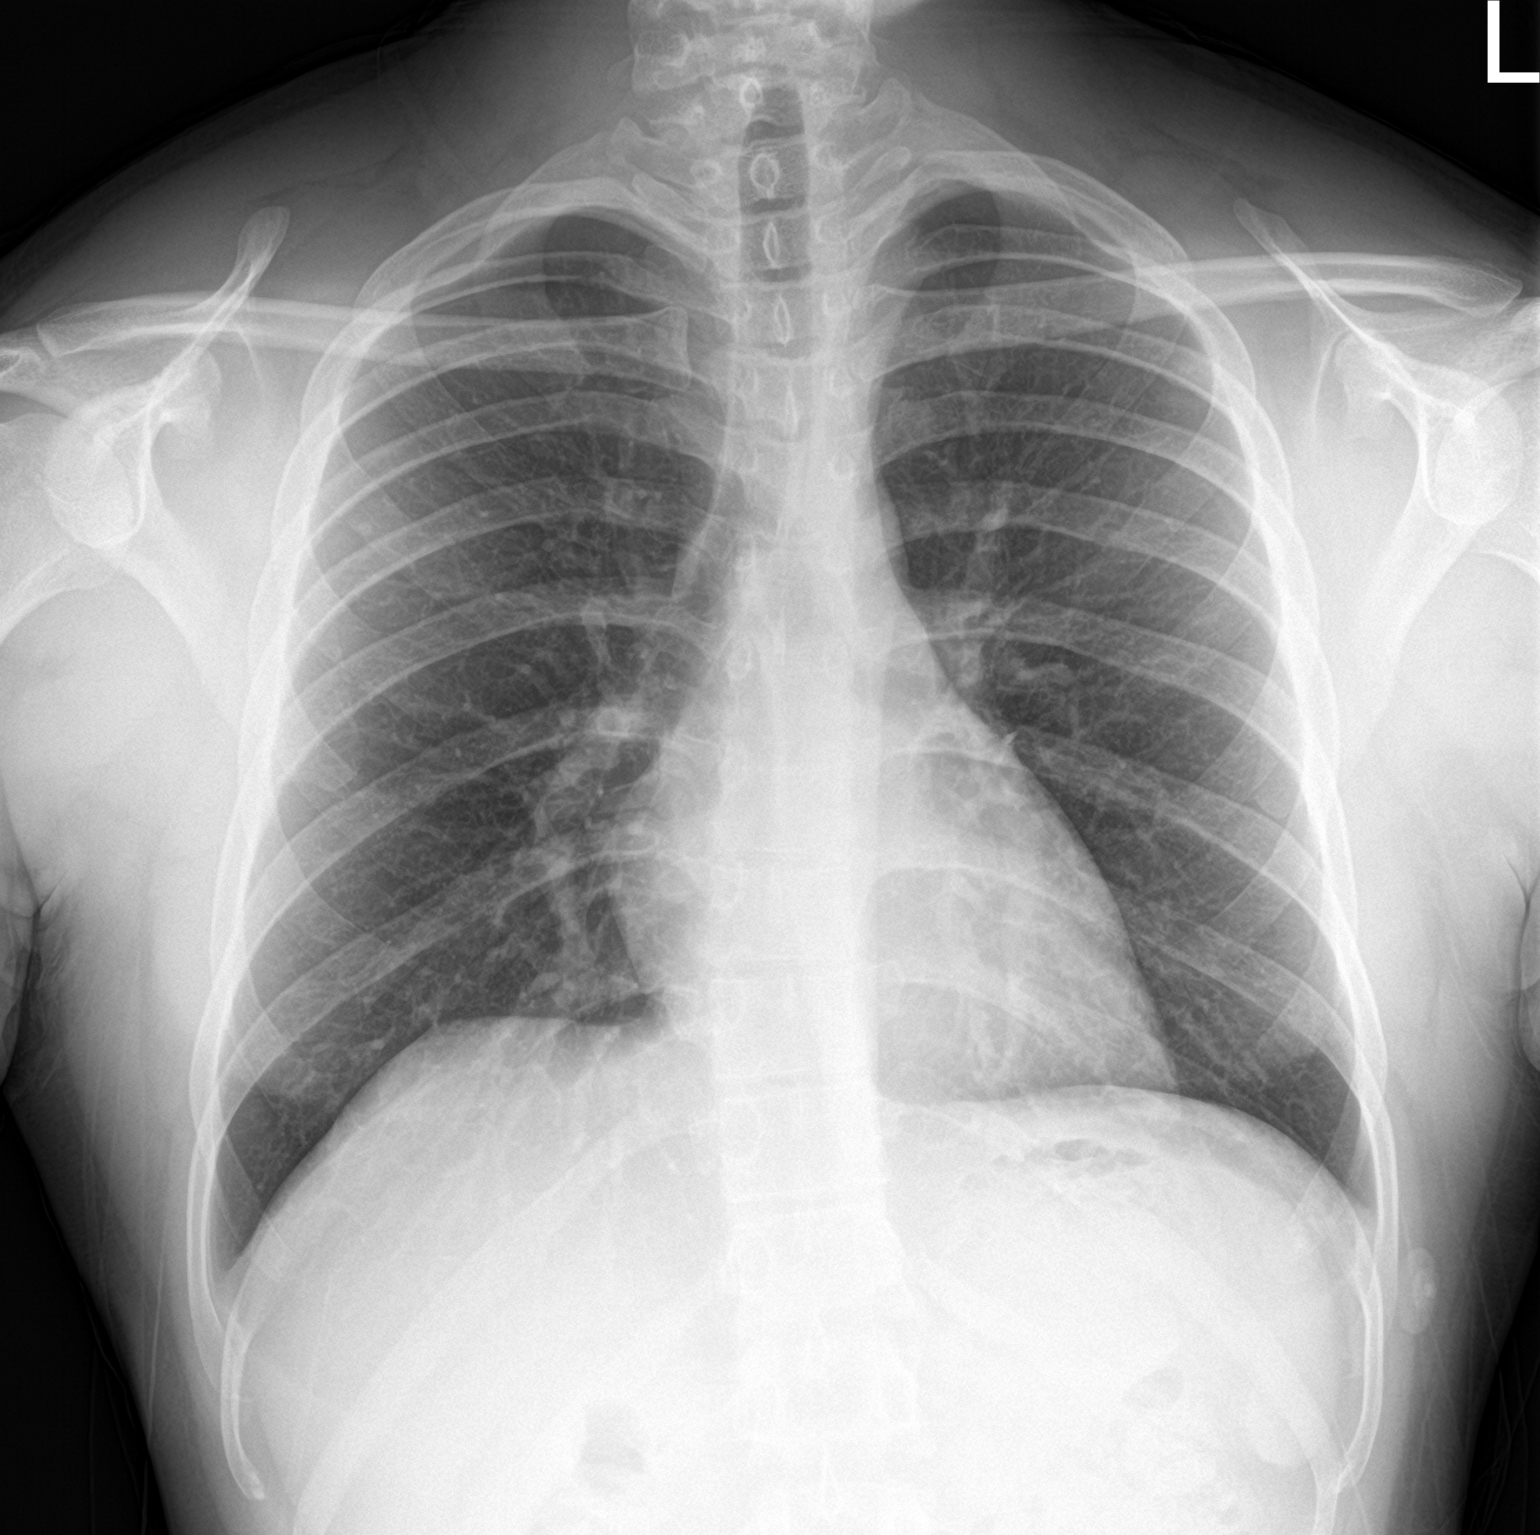

[chest lat]
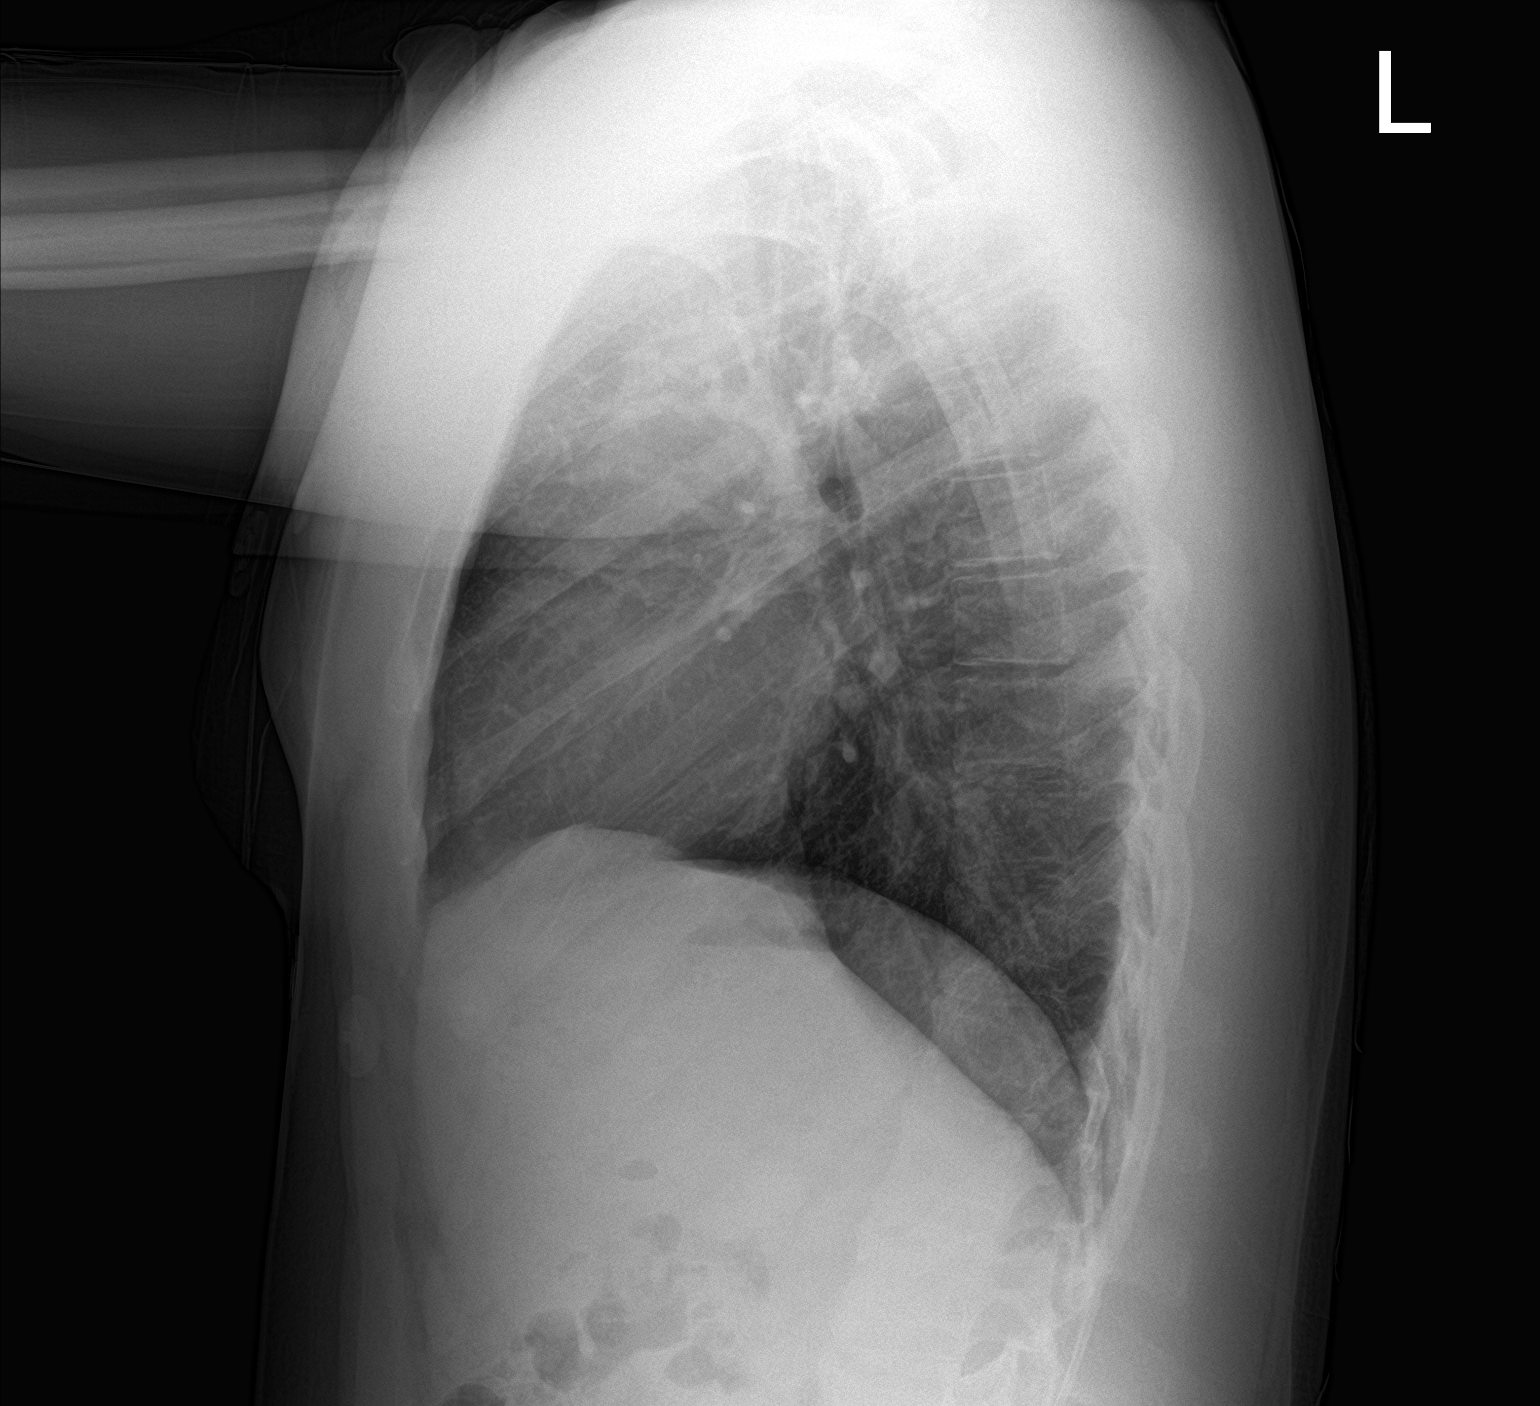

[2 of 2 positions shown; findings below may reference images not displayed]

FINDINGS: The heart size and mediastinal contours are within normal limits.
Both lungs are clear. The visualized skeletal structures are
unremarkable. Negative for a pneumothorax.
IMPRESSION: No active cardiopulmonary disease.

## 2022-04-21 DIAGNOSIS — F259 Schizoaffective disorder, unspecified: Secondary | ICD-10-CM | POA: Diagnosis not present

## 2022-04-21 DIAGNOSIS — J302 Other seasonal allergic rhinitis: Secondary | ICD-10-CM | POA: Diagnosis not present

## 2022-05-03 ENCOUNTER — Encounter (HOSPITAL_COMMUNITY): Payer: Self-pay | Admitting: Psychiatry

## 2022-05-03 ENCOUNTER — Ambulatory Visit (HOSPITAL_BASED_OUTPATIENT_CLINIC_OR_DEPARTMENT_OTHER): Payer: BC Managed Care – PPO | Admitting: Psychiatry

## 2022-05-03 ENCOUNTER — Ambulatory Visit (HOSPITAL_COMMUNITY): Payer: BC Managed Care – PPO | Admitting: Psychiatry

## 2022-05-03 DIAGNOSIS — F250 Schizoaffective disorder, bipolar type: Secondary | ICD-10-CM | POA: Diagnosis not present

## 2022-05-03 MED ORDER — VALPROIC ACID 250 MG/5ML PO SOLN: 500.0000 mg | Freq: Two times a day (BID) | ORAL | 0 refills | Status: DC

## 2022-05-03 MED ORDER — LORAZEPAM 2 MG PO TABS: 2.0000 mg | ORAL_TABLET | Freq: Three times a day (TID) | ORAL | 0 refills | Status: DC | PRN

## 2022-05-03 MED ORDER — OLANZAPINE 20 MG PO TABS: 20.0000 mg | ORAL_TABLET | Freq: Every day | ORAL | 1 refills | Status: DC

## 2022-05-03 MED ORDER — HYDROXYZINE HCL 25 MG PO TABS: 25.0000 mg | ORAL_TABLET | Freq: Three times a day (TID) | ORAL | 0 refills | Status: DC | PRN

## 2022-05-03 NOTE — Progress Notes (Signed)
Psychiatric Initial Adult Assessment   Patient Identification: Nathan Holloway MRN:  [id number] Date of Evaluation:  05/03/2022 Referral Source: [facility] Chief Complaint:   Chief Complaint  Patient presents with   Establish Care   Visit Diagnosis:    ICD-10-CM   1. Schizoaffective disorder, bipolar type (HCC)  F25.0 OLANZapine (ZYPREXA) 20 MG tablet    valproic acid (DEPAKENE) 250 MG/5ML solution    hydrOXYzine (ATARAX) 25 MG tablet    LORazepam (ATIVAN) 2 MG tablet       Assessment:  Ravin Denardo is a 26 y.o. y.o. male with a history of schizoaffective disorder bipolar type who presents virtually to [facility] at [facility] for initial evaluation and establishment of care following his discharge from [facility] on 04/14/22. [facility] notes were reviewed.   Patient reports being stable since his discharge from the inpatient psychiatric unit.  This was his sixth psychiatric hospitalization following a manic episode.  He reports during episodes of mania he is grandiose, paranoid, has increased energy, does not sleep, is reckless, and impulsive.  Patient reports that this episode occurred despite continuing his medications and believes it is the increased stressors that led to this occurring.  Since stabilizing on the unit he has returned back to his baseline.  At this present time patient meets criteria for schizoaffective disorder bipolar type and would benefit from continuing on his current medication regimen.  Counseling was provided for the risk of repeated episodes of mania, medication benefits and side effects, risk of substance use, and interaction of cigarette smoke with the medication.  Also discussed starting a as needed medication to be used the patient has not slept in 48 hours with the goal of preventing a psychiatric hospitalization.  Plan:  - Continue Zyprexa 20 mg QHS - Continue Depakene 500 mg MG BID - Depakote level was 48 on 04/13/22 - Continue Atarax 25 mg TID prn for  anxiety - Continue Trazodone 150 mg QHS prn for insomnia - Start Ativan 2 mg QHS prn for 24-48 hour period of insomnia - Discontinue Nicoderm  patch for nicotine dependence as patient no longer taking - Follow up in a month - Letter for work provided   History of Present Illness:  Patient presents today alongside his mother who is his legal guardian.He reports that things have been going fairly well since he discharged from the hospital around 3 weeks ago. He has been stable on his medication and denies any adverse side effects.  Dennard reports that he had his first episode of bipolar/schizoaffective disorder when he was still a teenager.  His mom describes him as acting "high" when he is manic.  Back during his first episode they first noticed that he was sleeping last and he had been writing constantly in a notebook filling it with 10 pages worth of ideas.  This progressed to him acting bizarrely where he was making associations with colors and different types of people.  And then patient began to frequently leave the house and throw his phone outside.  1 day he went to school and did not come back home and it was only later that his mother found he had walked to a different school and is being erratic outside of it.  Harlow notes that during an episode he feels like he is on top of the world and very grandiose but in hindsight they can be very scary.  Patient notes that he does not believe he ever had a significant depressive episode besides when  he was 12-14 after his father had passed.  During that time he notes he was withdrawn for around a year to before becoming extremely emotional.  Typically he describes himself as a little bit more withdrawn and does not display his mood emotions.  Patient's hospitalization this time was following a manic episode and this was a 6 hospitalization all of which have come in the context of manic episodes.  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  Despite taking his medications he notes still experiencing decline where he did not sleep or eat for a number of days.  He then proceeded to get more paranoid until his mother brought him to the hospital.  We discussed the patient's diagnosis with him and his mother and went over the risk factors and warning signs.  We went over the importance of continuing on his medication regimen and avoiding substance use as well as smoking.  Also discussed the risk of increased stress and how to manage lack of sleep.  Patient was cautioned of the risk of working a full-time job and attending school at the same time and advised to take a step back from 1 if he finds himself decompensating in the future again.  Associated Signs/Symptoms: Depression Symptoms:   Denies (Hypo) Manic Symptoms:   Denies currently but was manic with grandiosity, paranoia, insomnia, increased energy, and reckless behavior Anxiety Symptoms:   Denies Psychotic Symptoms:   Denies currently was paranoid a month ago when he was hospitalized PTSD Symptoms: Denies  Past Psychiatric History: Has had about 6 hospitalizations since then 3 in [place] and 3 in this area. He has tried abilify which made him nauseas, risperdal which gave a dystonic reaction, and haldol which gave dystonic reaction. He currently takes atarax if he thinks he is "going to much", trazodone rarely for sleep, Zyprexa and, Depakene. He prefers liquids to taking pills.   Previous Psychotropic Medications: Yes   Substance Abuse History in the last 12 months:  No.  Consequences of Substance Abuse: NA  Past Medical History:  Past Medical History:  Diagnosis Date   Manic behavior (HCC)    Psychotic affective disorder (HCC) 08/30/2015   No past surgical history on file.  Family Psychiatric  History: Believes patients mother and father both had undiagnosed depression. His mothers uncle had schizoaffective disorder.   Family History:  Family History  Problem Relation Age of Onset   Healthy Mother     Social History:   Social History   Socioeconomic History   Marital status: Single    Spouse name: Not on file   Number of children: Not on file   Years of education: Not on file   Highest education level: Not on file  Occupational History   Not on file  Tobacco Use   Smoking status: Never   Smokeless tobacco: Never  Vaping Use   Vaping Use: Every day   Substances: Nicotine, Flavoring  Substance and Sexual Activity   Alcohol use: No   Drug use: Not Currently    Types: Marijuana   Sexual activity: Yes  Other Topics Concern   Not on file  Social History Narrative   Not on file   Social Determinants of Health   Financial Resource Strain: Low Risk  (09/18/2020)   Overall Financial Resource Strain (  CARDIA)    Difficulty of Paying Living Expenses: Not very hard  Food Insecurity: No Food Insecurity (09/18/2020)   Hunger Vital Sign    Worried About Running Out of Food in the Last Year: Never true    Ran Out of Food in the Last Year: Never true  Transportation Needs: No Transportation Needs (09/18/2020)   PRAPARE - Administrator, Civil Service (Medical): No    Lack of Transportation (Non-Medical): No  Physical Activity: Sufficiently Active (09/18/2020)   Exercise Vital Sign    Days of Exercise per Week: 4 days    Minutes of Exercise per Session: 60 min  Stress: No Stress Concern Present (09/18/2020)   [employer] of Occupational Health - Occupational Stress Questionnaire    Feeling of Stress : Not at all  Social Connections: Socially Isolated (09/18/2020)   Social Connection and Isolation Panel [NHANES]    Frequency of Communication with Friends and Family: More than three times a week    Frequency of Social Gatherings with Friends and Family: Twice  a week    Attends Religious Services: Never    Database administrator or Organizations: No    Attends Banker Meetings: Never    Marital Status: Never married    Additional Social History: Works as a Scientist, 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 He moved here about 6 years ago with his mother and he has one sibling. He has his own apartment now and had moved there a few weeks before the episode. Patients father passed away when he was 57. 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 He has 11 more months and then is expecting to be offered a job after.   Allergies:   Allergies  Allergen Reactions   Risperidone And Related Other (See Comments)    Diastatic   Milk (Cow) Other (See Comments)    GI Upset   Pork-Derived Products Other (See Comments)    GI Upset   Risperdal [Risperidone] Other (See Comments)    Patient states he almost died taking risperdal. Unable to give specific reactions    Metabolic Disorder Labs: Lab Results  Component Value Date   HGBA1C 4.3 (L) 04/07/2022   MPG 76.71 04/07/2022   MPG 85.32 08/06/2020   Lab Results  Component Value Date   PROLACTIN 11.2 04/07/2022   PROLACTIN 3.7 (L) 07/04/2020   Lab Results  Component Value Date   CHOL 218 (H) 04/07/2022   TRIG 75 04/07/2022   HDL 80 04/07/2022   CHOLHDL 2.7 04/07/2022   VLDL 15 04/07/2022   LDLCALC 123 (H) 04/07/2022   LDLCALC 163 (H) 12/29/2020   Lab Results  Component Value Date   TSH 1.405 04/07/2022    Therapeutic Level Labs: No results found for: "LITHIUM" No results found for: "CBMZ" Lab Results  Component Value Date   VALPROATE 48 (L) 04/13/2022    Current Medications: Current Outpatient Medications  Medication Sig Dispense Refill   LORazepam (ATIVAN) 2 MG tablet Take 1 tablet (2 mg total) by mouth every 8 (eight) hours as needed for anxiety. To be taken if patient has not slept in 24-48 hours 7  tablet 0   hydrOXYzine (ATARAX) 25 MG tablet Take 1 tablet (25 mg total) by mouth 3 (three) times daily as needed for anxiety. 30 tablet 0   nicotine (NICODERM CQ - DOSED IN MG/24 HOURS) 14 mg/24hr patch Place  1 patch (14 mg total) onto the skin daily. 28 patch 0   OLANZapine (ZYPREXA) 20 MG tablet Take 1 tablet (20 mg total) by mouth at bedtime. 30 tablet 1   traZODone (DESYREL) 150 MG tablet Take 1 tablet (150 mg total) by mouth at bedtime as needed for sleep. 30 tablet 0   valproic acid (DEPAKENE) 250 MG/5ML solution Take 10 mLs (500 mg total) by mouth 2 (two) times daily. 600 mL 0   No current facility-administered medications for this visit.    Psychiatric Specialty Exam: Review of Systems  There were no vitals taken for this visit.There is no height or weight on file to calculate BMI.  General Appearance: Well Groomed  Eye Contact:  Good  Speech:  Clear and Coherent  Volume:  Normal  Mood:  Euthymic  Affect:  Congruent  Thought Process:  Coherent and Goal Directed  Orientation:  Full (Time, Place, and Person)  Thought Content:  NA  Suicidal Thoughts:  No  Homicidal Thoughts:  No  Memory:  Immediate;   Good  Judgement:  Good  Insight:  Fair  Psychomotor Activity:  Normal  Concentration:  Concentration: Good  Recall:  Good  Fund of Knowledge:Good  Language: Good  Akathisia:  NA    AIMS (if indicated):  not done  Assets:  Communication Skills Desire for Improvement Financial Resources/Insurance  ADL's:  Intact  Cognition: WNL  Sleep:  Good   Screenings: AIMS    Flowsheet Row [facility] ([facility]) [facility]  AIMS Total Score 0 0 0      AUDIT    Flowsheet Row [facility]) [facility] ([facility] ([facility]  Alcohol Use Disorder Identification Test Final Score (AUDIT) 4 0 0      GAD-7    Flowsheet Row Video Visit from 12/30/2020 in [facility] Clinical Support from 10/06/2020 in [facility] Counselor from 09/18/2020 in [facility]  Total GAD-7 Score 1 4 3       PHQ2-9    Flowsheet Row [facility] from 04/07/2022 in [facility] Video Visit from 12/30/2020 in [facility] Counselor from 12/24/2020 in [facility] Counselor from 11/09/2020 in [facility]  PHQ-2 Total Score 1 2 0 2 4  PHQ-9 Total Score -- 9 2 8 11       Flowsheet Row [facility] 05/03/2022 [facility] Admission (Discharged) [facility] from [facility]07/2022 in [facility]  C-SSRS RISK CATEGORY No Risk No Risk No Risk        Collaboration of Care: Medication Management AEB medication presription  Patient/Guardian was advised Release of Information must be obtained prior to any record release in order to collaborate their care with an outside provider. Patient/Guardian was advised if they have not already done so to contact the registration department to sign all necessary forms in order for 04/09/2022 to release information regarding their care.   Consent: Patient/Guardian gives verbal consent for treatment  and assignment of benefits for services provided during this visit. Patient/Guardian expressed understanding and agreed to proceed.   Stasia Cavalier,  MD 9/12/20233:58 PM    Virtual Visit via Video Note  I connected with [name] on 05/03/22 at  3:00 PM EDT by a video enabled telemedicine application and verified that I am speaking with the correct person using two identifiers.  Location: Patient: Home Provider: Home office   I discussed the limitations of evaluation and management by telemedicine and the availability of in person appointments. The patient expressed understanding and agreed to proceed.   I discussed the assessment and treatment plan with the patient. The patient was provided an opportunity to ask questions and all were answered. The patient agreed with the plan and demonstrated an understanding of the instructions.   The patient was advised to call back or seek an in-person evaluation if the symptoms worsen or if the condition fails to improve as anticipated.  I provided 45 minutes of non-face-to-face time during this encounter.   Stasia Cavalier, MD

## 2022-05-17 ENCOUNTER — Ambulatory Visit (INDEPENDENT_AMBULATORY_CARE_PROVIDER_SITE_OTHER): Payer: BC Managed Care – PPO | Admitting: Licensed Clinical Social Worker

## 2022-05-17 ENCOUNTER — Encounter (HOSPITAL_COMMUNITY): Payer: Self-pay

## 2022-05-17 DIAGNOSIS — F250 Schizoaffective disorder, bipolar type: Secondary | ICD-10-CM | POA: Diagnosis not present

## 2022-05-17 NOTE — Plan of Care (Signed)
  Problem: Depression Goal: Decrease depressive symptoms and improve levels of effective functioning-pt reports a decrease in overall depression symptoms 3 out of 5 sessions documented.  Outcome: Initial Goal: Develop healthy thinking patterns and beliefs about self, others, and the world that lead to the alleviation and help prevent the relapse of depression per self report 3 out of 5 sessions documented.   Outcome: Initial   Problem: Anxiety  Goal: Stabilize anxiety level while increasing ability to function on a daily basis as evidenced by pt self report 3 out of 5 sessions documented.  Outcome: Initial Goal: Learn and implement coping skills that result in a reduction of anxiety and worry, and improve daily functioning per pt report 3 out of 5 sessions documented  Outcome: Initial   Problem: Thought Disorders Goal: LTG: Increase coping skills to promote long-term recovery and improve ability to perform daily activities as evidenced by pt self report 3 out of 5 sessions documented Outcome: Initial Goal: STG: Promote adherence to treatment regimen as evidenced by pt self report 3 out of 5 sessions documented  Outcome: Initial Goal: STG: Increase understanding of positive symptoms as evidenced by patient self-report 3 out of 5 sessions documented Outcome: Initial   Developed/revised tx plan based on pt self reported input. Pt verbally agrees with treatment plan at time of session

## 2022-05-18 NOTE — Progress Notes (Signed)
Comprehensive Clinical Assessment (CCA) Note  05/18/2022 Nathan Holloway [id number]  Chief Complaint:  Chief Complaint  Patient presents with   Establish Care   Visit Diagnosis:   Encounter Diagnosis  Name Primary?   Schizoaffective disorder, bipolar type ([place]) Yes    CCA Screening, Triage and Referral (STR)  Patient Reported Information How did you hear about [place]? Other (Comment) (psychiatrist)  Referral name: Dr. Charlette Caffey  Referral phone number: No data recorded  Whom do you see for routine medical problems? No data recorded Practice/Facility Name: No data recorded Practice/Facility Phone Number: No data recorded Name of Contact: No data recorded Contact Number: No data recorded Contact Fax Number: No data recorded Prescriber Name: No data recorded Prescriber Address (if known): No data recorded  What Is the Reason for Your Visit/Call Today? Nathan Holloway Is a 26 year old male reporting to [facility] or establishment about patient psychotherapy services. Patient reports that he is currently under the psychiatric care of Dr. Charlette Caffey for treatment of schizoaffective disorder. Patient is currently taking Zyprexa, depakote, atarax, Trazodone, and ativan to manage symptoms. Patient reports a history of multiple psychiatric inpatient hospitalizations. Patients most recent admission was 04/08/2022 through 04/14/2022. Patient reports that the incident prior to his hospitalization included symptoms of a "manic episode" which included feelings of euphoria, religious preoccupation, and insomnia. Patient reports that he was compliant with his medication prior to the most recent hospitalization. Patient denies any suicidal ideation, homicidal ideation, or any perceptual disturbances. Patient reports that he drinks alcohol socially and has a history of using THC, but nothing recently. Patient reports that he currently resides with his significant other, and he feels like he has a very good  social support system including his mother, who is also his legal guardian. Patient reports that his primary external stressors are recent loss of his cat, and financial stress associated with losing hours due to his mental health and leave of absence. Patient reports that some of his primary goals for outpatient therapy include development of coping skills to help manage anxiety and stress, and to develop life skills to help him feel more secure about living independently.  How Long Has This Been Causing You Problems? > than 6 months  What Do You Feel Would Help You the Most Today? Treatment for Depression or other mood problem   Have You Recently Been in Any Inpatient Treatment (Hospital/Detox/Crisis Center/28-Day Program)? Yes  Name/Location of Program/Hospital:[facility] inpatient  How Long Were You There? 6 days  When Were You Discharged? 04/14/22   Have You Ever Received Services From [employer] Before? Yes  Who Do You See at [facility]? Dr. Charlette Caffey   Have You Recently Had Any Thoughts About [place]? No  Are You Planning to Commit Suicide/Harm Yourself At This time? No   Have you Recently Had Thoughts About [place]? No  Explanation: No data recorded  Have You Used Any Alcohol or Drugs in the Past 24 Hours? No  How Long Ago Did You Use Drugs or Alcohol? No data recorded What Did You Use and How Much? No data recorded  Do You Currently Have a Therapist/Psychiatrist? Yes  Name of Therapist/Psychiatrist: Dr. Charlette Caffey   Have You Been Recently Discharged From Any Office Practice or Programs? No  Explanation of Discharge From Practice/Program: No data recorded    CCA Screening Triage Referral Assessment Type of Contact: Tele-Assessment  Is this Initial or Reassessment? Initial Assessment  Date Telepsych consult ordered in [facility]:  No data recorded Time Telepsych consult ordered in [facility]:  No data recorded  Patient Reported Information Reviewed?  No data recorded Patient Left Without Being Seen? No data recorded Reason for Not Completing Assessment: No data recorded  Collateral Involvement: EPIC review   Does Patient Have a Court Appointed Legal Guardian? No data recorded Name and Contact of Legal Guardian: No data recorded If Minor and Not Living with Parent(s), Who has Custody? Pts mother is his legal guardian, Nathan Holloway [phone]  Is CPS involved or ever been involved? Never  Is APS involved or ever been involved? Never   Patient Determined To Be At Risk for Harm To Self or Others Based on Review of Patient Reported Information or Presenting Complaint? No  Method: No data recorded Availability of Means: No data recorded Intent: No data recorded Notification Required: No data recorded Additional Information for Danger to Others Potential: No data recorded Additional Comments for Danger to Others Potential: No data recorded Are There Guns or Other Weapons in Your Home? No data recorded Types of Guns/Weapons: No data recorded Are These Weapons Safely Secured?                            No data recorded Who Could Verify You Are Able To Have These Secured: No data recorded Do You Have any Outstanding Charges, Pending Court Dates, Parole/Probation? No data recorded Contacted To Inform of Risk of Harm To Self or Others: No data recorded  Location of Assessment: Other (comment) ([facility])   Does Patient Present under Involuntary Commitment? No  IVC Papers Initial File Date: No data recorded  [place] of Residence: [place]   Patient Currently Receiving the Following Services: Medication Management   Determination of Need: Routine (7 days)   Options For Referral: Medication Management; Outpatient Therapy     CCA Biopsychosocial Intake/Chief Complaint:  episodic mood disorder  Current Symptoms/Problems: euphoria, religious preoccupation, insomnia, hypersomnia, fatigue, hopelessness, flashbacks   Patient  Reported Schizophrenia/Schizoaffective Diagnosis in Past: Yes (schizoaffective disorder)   Strengths: pt reports that he is seeking resources to help him make better life choices  Preferences: outpatient psychiatric supports  Abilities: music, dancing, modeling   Type of Services Patient Feels are Needed: medication management; psychotherapy   Initial Clinical Notes/Concerns: pt concerned about recent weight gain   Mental Health Symptoms Depression:   Sleep (too much or little); Fatigue; Weight gain/loss; Difficulty Concentrating (hypersomnia)   Duration of Depressive symptoms:  Greater than two weeks   Mania:   Racing thoughts; Change in energy/activity; Increased Energy; Overconfidence; Recklessness; Euphoria; Irritability ("only during a mania episode")   Anxiety:    Restlessness; Worrying   Psychosis:   None   Duration of Psychotic symptoms: No data recorded  Trauma:   Re-experience of traumatic event (flashbacks at times about things that didn't happen)   Obsessions:   Recurrent & persistent thoughts/impulses/images (religious preoccupation during a "mania" state)   Compulsions:   "Driven" to perform behaviors/acts   Inattention:   None   Hyperactivity/Impulsivity:   None   Oppositional/Defiant Behaviors:   None   Emotional Irregularity:   None   Other Mood/Personality Symptoms:  No data recorded   Mental Status Exam Appearance and self-care  Stature:   Average   Weight:   Average weight   Clothing:   Neat/clean   Grooming:   Normal   Cosmetic use:   None   Posture/gait:   Normal   Motor  activity:   Not Remarkable   Sensorium  Attention:   Normal   Concentration:   Normal   Orientation:   X5   Recall/memory:   Normal   Affect and Mood  Affect:   Full Range   Mood:   Other (Comment) (pleasant)   Relating  Eye contact:   Normal   Facial expression:   Responsive   Attitude toward examiner:   Cooperative    Thought and Language  Speech flow:  Clear and Coherent   Thought content:   Appropriate to Mood and Circumstances   Preoccupation:   None   Hallucinations:   None   Organization:  No data recorded  [employer] of Knowledge:   Good   Intelligence:   Average   Abstraction:   Normal   Judgement:   Fair   Dance movement psychotherapist:   Realistic   Insight:   Gaps   Decision Making:   Vacilates   Social Functioning  Social Maturity:   Impulsive (history of impulsivity)   Social Judgement:   Normal   Stress  Stressors:   Surveyor, quantity; Grief/losses (recent loss of family pet; current financial hardship due to medical leave)   Coping Ability:   Overwhelmed; Exhausted   Skill Deficits:   Self-control   Supports:   Family; Friends/Service system     Religion: Religion/Spirituality Are You A Religious Person?: Yes  Leisure/Recreation: Leisure / Recreation Do You Have Hobbies?: Yes Leisure and Hobbies: video games, poetry, music, movies, walking, driving  Exercise/Diet: Exercise/Diet Do You Exercise?: Yes What Type of Exercise Do You Do?: Weight Training How Many Times a Week Do You Exercise?: 4-5 times a week Have You Gained or Lost A Significant Amount of Weight in the Past Six Months?: Yes-Gained Number of Pounds Gained: 15 Do You Follow a Special Diet?: No Do You Have Any Trouble Sleeping?: No (pt reports current hypersomnia)   CCA Employment/Education Employment/Work Situation: Employment / Work Situation Employment Situation: Employed 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, historic buildings taking courses in cybersecurity Last Grade Completed: 12 Did Garment/textile technologist From [employer]?: Yes Did Theme park manager?: Yes What Type of College Degree Do you Have?: Pt has two years of school at [employer] in psychology Did You Attend Graduate School?: No What Was Your Major?: psychology Did You Have An Individualized Education Program (IIEP): No Did You Have Any Difficulty At School?: No Patient's Education Has Been Impacted by Current Illness: No   CCA Family/Childhood History Family and Relationship History: Family history Marital status: Long term relationship Long term relationship, how long?: 1 year What types of issues is patient dealing with in the relationship?: Tried to propose to her, but she missed every hint.  States he is not breaking up with her but "taking a break." Are you sexually active?: Yes What is your sexual orientation?: Heterosexual  Has your sexual activity been affected by drugs, alcohol, medication, or emotional stress?: none reported Does patient have children?: No  Childhood History:  Childhood History By whom was/is the patient raised?: Mother Additional childhood history information: Father passed away when he was 63 of cancer Description of patient's relationship with caregiver when they were a child: stable Patient's description of current relationship with people who raised him/her: mother supportive How were you disciplined when you got in trouble as a child/adolescent?: pt states he really did not cause trouble as kid Does patient have siblings?: Yes Number of Siblings: 2 Description of patient's current relationship with siblings: younger sister, older  half-brother - good relationships Did patient suffer any verbal/emotional/physical/sexual abuse as a child?: No Did patient suffer from severe childhood neglect?: No Has patient ever been sexually abused/assaulted/raped as an adolescent or adult?: No Was the patient ever a victim of a crime or a disaster?: No Witnessed domestic violence?: No Has patient been affected by domestic violence as an adult?: No  Child/Adolescent Assessment:     CCA Substance Use Alcohol/Drug Use: Alcohol / Drug Use Pain Medications: SEE MAR Prescriptions: SEE MAR Over the Counter: SEE MAR History of alcohol / drug use?: Yes Longest period of sobriety (when/how long): 1 year Negative Consequences of Use: Work / Programmer, multimedia, Copywriter, advertising relationships, Surveyor, quantity Withdrawal Symptoms: None Substance #1 Name of Substance 1: ETOH 1 - Frequency: SOCIAL 1 - Method of Aquiring: LEGAL 1- Route of Use: ORAL DRINK Substance #2 Name of Substance 2: THC--IN PAST 2 - Last Use / Amount: "ITS BEEN A WHILE" 2 - Method of Aquiring: STREET 2 - Route of Substance Use: ORAL SMOKE     ASAM's:  Six Dimensions of Multidimensional Assessment  Dimension 1:  Acute Intoxication and/or Withdrawal Potential:   Dimension 1:  Description of individual's past and current experiences of substance use and withdrawal: SOCIAL DRINK, HISTORY OF THC USE  Dimension 2:  Biomedical Conditions and Complications:      Dimension 3:  Emotional, Behavioral, or Cognitive Conditions and Complications:     Dimension 4:  Readiness to Change:     Dimension 5:  Relapse, Continued use, or Continued Problem Potential:     Dimension 6:  Recovery/Living Environment:     ASAM Severity Score: ASAM's Severity Rating Score: 0  ASAM Recommended Level of Treatment: ASAM Recommended Level of Treatment: Level I Outpatient Treatment   Substance use Disorder (SUD) Substance Use Disorder (SUD)  Checklist Symptoms of Substance Use:  (NONE)  Recommendations for  Services/Supports/Treatments: Recommendations for Services/Supports/Treatments Recommendations For Services/Supports/Treatments: Medication Management, Individual Therapy  DSM5 Diagnoses: Patient Active Problem List   Diagnosis Date Noted   Anxiety state 04/09/2022   Insomnia 04/09/2022   Hypertension 08/11/2020   High risk medication use 08/11/2020   Nicotine abuse 08/11/2020   Schizoaffective disorder, bipolar type (HCC) 07/11/2020    Patient Centered Plan: Patient is on the following Treatment Plan(s):  Anxiety and Depression   Referrals to Alternative Service(s): Referred to Alternative Service(s):   Place:   Date:   Time:    Referred to Alternative Service(s):   Place:   Date:   Time:    Referred to Alternative Service(s):   Place:   Date:   Time:    Referred to Alternative Service(s):   Place:   Date:   Time:      Collaboration of Care: Other Pt encouraged to continue care with psychiatrist of record, Dr. Everlena Cooper  Patient/Guardian was advised Release of  Information must be obtained prior to any record release in order to collaborate their care with an outside provider. Patient/Guardian was advised if they have not already done so to contact the registration department to sign all necessary forms in order for [place] to release information regarding their care.   Consent: Patient/Guardian gives verbal consent for treatment and assignment of benefits for services provided during this visit. Patient/Guardian expressed understanding and agreed to proceed.   Xavious Sharrar R Gerritt Galentine, LCSW

## 2022-06-02 ENCOUNTER — Telehealth (HOSPITAL_BASED_OUTPATIENT_CLINIC_OR_DEPARTMENT_OTHER): Payer: BC Managed Care – PPO | Admitting: Psychiatry

## 2022-06-02 ENCOUNTER — Encounter (HOSPITAL_COMMUNITY): Payer: Self-pay | Admitting: Psychiatry

## 2022-06-02 DIAGNOSIS — F250 Schizoaffective disorder, bipolar type: Secondary | ICD-10-CM | POA: Diagnosis not present

## 2022-06-02 MED ORDER — VALPROIC ACID 250 MG/5ML PO SOLN: 500.0000 mg | Freq: Two times a day (BID) | ORAL | 1 refills | Status: DC

## 2022-06-02 MED ORDER — OLANZAPINE 15 MG PO TABS: 15.0000 mg | ORAL_TABLET | Freq: Every day | ORAL | 1 refills | Status: DC

## 2022-06-02 NOTE — Progress Notes (Signed)
BH MD/PA/NP OP Progress Note  06/02/2022 1:31 PM Nathan Holloway  MRN:  [id number]  Visit Diagnosis:    ICD-10-CM   1. Schizoaffective disorder, bipolar type [facility])  F25.0       Assessment: Nathan Holloway is a 26 y.o. y.o. male with a history of schizoaffective disorder bipolar type who presents virtually to [facility] at [facility] for initial evaluation and establishment of care following his discharge from [facility] on 04/14/22. [facility] notes were reviewed.   Patient reports being stable since his discharge from the inpatient psychiatric unit.  This was his sixth psychiatric hospitalization following a manic episode.  He reports during episodes of mania he is grandiose, paranoid, has increased energy, does not sleep, is reckless, and impulsive.  Patient reports that this episode occurred despite continuing his medications and believes it is the increased stressors that led to this occurring.  Since stabilizing on the unit he has returned back to his baseline.  At this present time patient meets criteria for schizoaffective disorder bipolar type and would benefit from continuing on his current medication regimen.  Counseling was provided for the risk of repeated episodes of mania, medication benefits and side effects, risk of substance use, and interaction of cigarette smoke with the medication.  Also discussed starting a as needed medication to be used the patient has not slept in 48 hours with the goal of preventing a psychiatric hospitalization.  Nathan Holloway presents for follow-up evaluation. Today, 06/02/22, patient reports feeling a bit more depressed over the past month.  He feels that he has difficulty getting up and getting started in the mornings which can last the whole day when he takes his morning Depakene dose.  He misses the Depakene dose in the morning around twice a week and notices that his energy levels and mood improved to what he considers normal baseline.  He denies it  being to the point of hypomania.  Patient also induces increased weight gain despite changing his on diet and exercising.  Patient is likely experiencing side effects of the Zyprexa and the Depakene leading to the weight gain and increased sedation.  Discussed options to manage this and they agreed to decrease Zyprexa to 15 mg at bedtime and change Depakene dosing to 250 milligrams in the morning and 750 mg at bedtime.  Patient was cautioned of the risk of returning to hypomania/mania and will keep careful eye with a plan to increase Zyprexa back to 20 mg if warning signs occur.  Plan:  - Decrease to Zyprexa 15 mg QHS - Change Depakene to 250 mg MG in the morning and 750 mg QHS - Depakote level was 48 on 04/13/22 - Continue Atarax 25 mg TID prn for anxiety - Continue Trazodone 150 mg QHS prn for insomnia - Continue Ativan 2 mg QHS prn for 24-48 hour period of insomnia - Discontinue Nicoderm  patch for nicotine dependence as patient no longer taking - Follow up in a month - FMLA updated, patient to provide request for further progress note updates    Chief Complaint:  Chief Complaint  Patient presents with   Schizophrenia   Follow-up   HPI: Patient presents reporting that the last month has gone all right but he started to notice that it is difficult for him to get up and get going in the morning.  Nathan Holloway notes that he will go to sleep around 11 or 12 at night and then wake up around 9-10 the following morning with difficulty eating  himself going.  Once he finally does get going he is able to make it through most of the day feeling good.  However if he takes the Depakene in the morning he notes that he is does drag a bit more and feels tired to the point where he is concerned about falling asleep while driving.  He has also found that he has started again a lot of weight despite making adjustments to his diet and exercise routine. Nathan Holloway had gone to the pharmacy to ask about medication side  effects and was informed that it might be the Depakene.  We discussed the side effects of both Depakene and Zyprexa with the patient and explained that they may be contributing both to his sedation and to his increased appetite/weight gain.  Patient expressed interest in trying to improve the symptoms while continuing to control his mania.  On exploration of his past history of mania he notes that often times the episodes occur during a transition in his life.  During these transitions he can become busy and forget to take some of his medications regularly whether it be the antipsychotic or the mood stabilizer.  For his most recent episode patient reports that he had been missing doses of the Depakote which he believes may have been due to its pill form.  Currently he reports that he misses the Depakene around twice a week as he feels too sedated when he does take it.  We discussed some options to help compliance and to help with the oversedation including changing the dosing so he takes more at night and less in the morning.  Nathan Holloway was open to giving this a try in addition to trying to take the medication earlier at night.  As for the weight gain on antipsychotic we discussed that we had to balance the benefit of the medication versus side effects.  Patient notes that he was well controlled on 10 mg in the past.  We agreed to decrease the dose of Zyprexa to 15 mg with patient monitoring his mood, sleep, and signs of hypomania closely.  If patient is to notice any signs of trending towards hypomania but he will increase the Zyprexa dose back to 20 mg daily.  We also discussed patient's future plans and how attending school and work might be too much for him.  While he was able to make it work for her.  In the past he was also hypomanic at the time and it is unlikely that he will be able to do this without being in that state.  Patient acknowledges that he has had this concern recently as well and has been  thinking about dropping out of the school.  He does acknowledge the concerns about reoccurrence of his symptoms and wants to his best to prevent that from happening again.  Past Psychiatric History: Has had about 6 hospitalizations since then 3 in [place] and 3 in this area. He has tried abilify which made him nauseas, risperdal which gave a dystonic reaction, and haldol which gave dystonic reaction. He currently takes atarax if he thinks he is "going to much", trazodone rarely for sleep, Zyprexa and, Depakene. He prefers liquids to taking pills.  Past Medical History:  Past Medical History:  Diagnosis Date   Manic behavior (HCC)    Psychotic affective disorder (HCC) 08/30/2015   No past surgical history on file.  Family Psychiatric History: Believes patients mother and father both had undiagnosed depression. His mothers uncle had schizoaffective disorder.  Family History:  Family History  Problem Relation Age of Onset   Healthy Mother     Social History:  Social History   Socioeconomic History   Marital status: Single    Spouse name: Not on file   Number of children: Not on file   Years of education: Not on file   Highest education level: Not on file  Occupational History   Not on file  Tobacco Use   Smoking status: Never   Smokeless tobacco: Never  Vaping Use   Vaping Use: Every day   Substances: Nicotine, Flavoring  Substance and Sexual Activity   Alcohol use: No   Drug use: Not Currently    Types: Marijuana   Sexual activity: Yes  Other Topics Concern   Not on file  Social History Narrative   Not on file   Social Determinants of Health   Financial Resource Strain: Low Risk  (09/18/2020)   Overall Financial Resource Strain (CARDIA)    Difficulty of Paying Living Expenses: Not very hard  Food Insecurity: No Food Insecurity (09/18/2020)   Hunger Vital Sign    Worried About Running Out of Food in the Last Year: Never true    Ran Out of Food in the Last Year: Never  true  Transportation Needs: No Transportation Needs (09/18/2020)   PRAPARE - Administrator, Civil Service (Medical): No    Lack of Transportation (Non-Medical): No  Physical Activity: Sufficiently Active (09/18/2020)   Exercise Vital Sign    Days of Exercise per Week: 4 days    Minutes of Exercise per Session: 60 min  Stress: No Stress Concern Present (09/18/2020)   [employer] of Occupational Health - Occupational Stress Questionnaire    Feeling of Stress : Not at all  Social Connections: Socially Isolated (09/18/2020)   Social Connection and Isolation Panel [NHANES]    Frequency of Communication with Friends and Family: More than three times a week    Frequency of Social Gatherings with Friends and Family: Twice a week    Attends Religious Services: Never    Database administrator or Organizations: No    Attends Banker Meetings: Never    Marital Status: Never married    Allergies:  Allergies  Allergen Reactions   Risperidone And Related Other (See Comments)    Diastatic   Milk (Cow) Other (See Comments)    GI Upset   Pork-Derived Products Other (See Comments)    GI Upset   Risperdal [Risperidone] Other (See Comments)    Patient states he almost died taking risperdal. Unable to give specific reactions    Current Medications: Current Outpatient Medications  Medication Sig Dispense Refill   hydrOXYzine (ATARAX) 25 MG tablet Take 1 tablet (25 mg total) by mouth 3 (three) times daily as needed for anxiety. 30 tablet 0   LORazepam (ATIVAN) 2 MG tablet Take 1 tablet (2 mg total) by mouth every 8 (eight) hours as needed for anxiety. To be taken if patient has not slept in 24-48 hours 7 tablet 0   nicotine (NICODERM CQ - DOSED IN MG/24 HOURS) 14 mg/24hr patch Place 1 patch (14 mg total) onto the skin daily. 28 patch 0   OLANZapine (ZYPREXA) 20 MG tablet Take 1 tablet (20 mg total) by mouth at bedtime. 30 tablet 1   traZODone (DESYREL) 150 MG tablet Take  1 tablet (150 mg total) by mouth at bedtime as needed for sleep. 30 tablet 0   valproic acid (  DEPAKENE) 250 MG/5ML solution Take 10 mLs (500 mg total) by mouth 2 (two) times daily. 600 mL 0   No current facility-administered medications for this visit.     Psychiatric Specialty Exam: Review of Systems  There were no vitals taken for this visit.There is no height or weight on file to calculate BMI.  General Appearance: Well Groomed  Eye Contact:  Good  Speech:  Clear and Coherent and Normal Rate  Volume:  Normal  Mood:  Euthymic  Affect:  Congruent  Thought Process:  Coherent and Goal Directed  Orientation:  Full (Time, Place, and Person)  Thought Content: Logical   Suicidal Thoughts:  No  Homicidal Thoughts:  No  Memory:  NA  Judgement:  Fair  Insight:  Fair  Psychomotor Activity:  Normal  Concentration:  Concentration: Good  Recall:  Good  Fund of Knowledge: Good  Language: Good  Akathisia:  No    AIMS (if indicated): not done  Assets:  Agricultural consultant Housing Physical Health  ADL's:  Intact  Cognition: WNL  Sleep:  Good   Metabolic Disorder Labs: Lab Results  Component Value Date   HGBA1C 4.3 (L) 04/07/2022   MPG 76.71 04/07/2022   MPG 85.32 08/06/2020   Lab Results  Component Value Date   PROLACTIN 11.2 04/07/2022   PROLACTIN 3.7 (L) 07/04/2020   Lab Results  Component Value Date   CHOL 218 (H) 04/07/2022   TRIG 75 04/07/2022   HDL 80 04/07/2022   CHOLHDL 2.7 04/07/2022   VLDL 15 04/07/2022   LDLCALC 123 (H) 04/07/2022   LDLCALC 163 (H) 12/29/2020   Lab Results  Component Value Date   TSH 1.405 04/07/2022   TSH 0.958 07/04/2020    Therapeutic Level Labs: No results found for: "LITHIUM" Lab Results  Component Value Date   VALPROATE 48 (L) 04/13/2022   VALPROATE <10 (L) 04/07/2022   No results found for: "CBMZ"   Screenings: [facility] Admission (Discharged) from 04/08/2022 in [place] [facility] Admission (Discharged) from 08/07/2020 in [place] [facility] Admission (Discharged) from 07/05/2020 in [place] [facility]  AIMS Total Score 0 0 0      AUDIT    Flowsheet Row Admission (Discharged) from 04/08/2022 in [place] [facility] Admission (Discharged) from 08/07/2020 in [place] [facility] Admission (Discharged) from 07/05/2020 in [place] [facility]  Alcohol Use Disorder Identification Test Final Score (AUDIT) 4 0 0      GAD-7    Flowsheet Row Counselor from 05/17/2022 in [place] Video Visit from 12/30/2020 in [facility] from 10/06/2020 in [facility] Counselor from 09/18/2020 in [facility]  Total GAD-7 Score 6 1 4 3       PHQ2-9    Flowsheet Row Counselor from 05/17/2022 in [place] Office Visit from 05/03/2022 in [facility] from 04/07/2022 in [facility] Video Visit from 12/30/2020 in [facility] Counselor from 12/24/2020 in [facility]  PHQ-2 Total Score 1 1 2  0 2  PHQ-9 Total Score 12 -- 9 2 8       Flowsheet Row Counselor from 05/17/2022 in [place] Office Visit from 05/03/2022 in [facility] Ad[facility] (Dis[facility]  C-SSRS RISK CATEGORY No Risk No Risk No Risk       Collaboration of Care: Collaboration of Care: Medication Management AEB medication prescription  Patient/Guardian was advised Release of Information must be obtained prior to any record release in order to  collaborate their care with an outside provider. Patient/Guardian was advised if they have not already done so to contact the registration department to sign all necessary forms in order for us to release information regarding their care.   Consent: Patient/Guardian gives verbal consent for treatment and assignment of benefits for services provided during this visit. Patient/Guardian expressed understanding and agreed to proceed.    Stasia CavalierBrad M Breyer Tejera, MD 06/02/2022, 1:31 PM   Virtual Visit via Video Note  I connected with Nathan Beetsaniel Oki on 06/02/22 at  2:00 PM EDT by a video enabled telemedicine application and verified that I am speaking with the correct person using two identifiers.  Location: Patient: Home Provider: Home Office   I discussed the limitations of evaluation and management by telemedicine and the availability of in person appointments. The patient expressed understanding and agreed to proceed.   I discussed the assessment and treatment plan with the patient. The patient was provided an opportunity to ask questions and all were answered. The patient agreed with the plan and demonstrated an understanding of the instructions.   The patient was advised to call back or seek an in-person evaluation if the symptoms worsen or if the condition fails to improve as anticipated.  I provided 30 minutes of non-face-to-face time during this encounter.   Stasia CavalierBrad M Carmin Alvidrez, MD

## 2022-06-14 ENCOUNTER — Other Ambulatory Visit (HOSPITAL_COMMUNITY): Payer: Self-pay | Admitting: *Deleted

## 2022-06-14 ENCOUNTER — Other Ambulatory Visit (HOSPITAL_COMMUNITY): Payer: Self-pay | Admitting: Psychiatry

## 2022-06-14 DIAGNOSIS — F250 Schizoaffective disorder, bipolar type: Secondary | ICD-10-CM

## 2022-06-14 MED ORDER — VALPROIC ACID 250 MG/5ML PO SOLN: 500.0000 mg | Freq: Two times a day (BID) | ORAL | 2 refills | Status: DC

## 2022-06-14 MED ORDER — OLANZAPINE 15 MG PO TABS: 15.0000 mg | ORAL_TABLET | Freq: Every day | ORAL | 0 refills | Status: DC

## 2022-06-20 ENCOUNTER — Encounter (HOSPITAL_COMMUNITY): Payer: Self-pay

## 2022-06-27 ENCOUNTER — Ambulatory Visit (INDEPENDENT_AMBULATORY_CARE_PROVIDER_SITE_OTHER): Payer: Self-pay | Admitting: Licensed Clinical Social Worker

## 2022-06-27 ENCOUNTER — Telehealth (HOSPITAL_COMMUNITY): Payer: Self-pay | Admitting: Licensed Clinical Social Worker

## 2022-06-27 DIAGNOSIS — Z91199 Patient's noncompliance with other medical treatment and regimen due to unspecified reason: Secondary | ICD-10-CM

## 2022-06-27 NOTE — Progress Notes (Signed)
LCSW counselor attempted to connect with patient for scheduled appointment via MyChart video text request x 2 and email request with no response; also attempted to connect via phone without success. LCSW counselor left message for patient to call office number to reschedule OPT appointment.   Attempt 1: Text and email: 9:03a  Attempt 2: Text and email:9:06a  Attempt 3: phone call: 9:15a and 9:20a--LMVM  Per [place], after multiple attempts to reach pt unsuccessfully at appointed time--visit will be coded as no show

## 2022-06-27 NOTE — Telephone Encounter (Signed)
LCSW counselor attempted to connect with patient for scheduled appointment via MyChart video text request x 2 and email request with no response; also attempted to connect via phone without success. LCSW counselor left message for patient to call office number to reschedule OPT appointment.   Attempt 1: Text and email: 9:03a  Attempt 2: Text and email:9:06a  Attempt 3: phone call: 9:15a and 9:20a--LMVM  Per [place] policy, after multiple attempts to reach pt unsuccessfully at appointed time--visit will be coded as no show  

## 2022-07-05 ENCOUNTER — Telehealth (HOSPITAL_COMMUNITY): Payer: BC Managed Care – PPO | Admitting: Psychiatry

## 2022-07-07 ENCOUNTER — Telehealth (HOSPITAL_BASED_OUTPATIENT_CLINIC_OR_DEPARTMENT_OTHER): Payer: BC Managed Care – PPO | Admitting: Psychiatry

## 2022-07-07 ENCOUNTER — Encounter (HOSPITAL_COMMUNITY): Payer: Self-pay | Admitting: Psychiatry

## 2022-07-07 DIAGNOSIS — F250 Schizoaffective disorder, bipolar type: Secondary | ICD-10-CM

## 2022-07-07 NOTE — Progress Notes (Signed)
BH MD/PA/NP OP Progress Note  07/07/2022 2:31 PM Nathan Holloway  MRN:  [id number]  Visit Diagnosis:    ICD-10-CM   1. Schizoaffective disorder, bipolar type [facility])  F25.0       Assessment: Nathan Holloway is a 26 y.o. y.o. male with a history of schizoaffective disorder bipolar type who presents virtually to [facility] at [facility] for initial evaluation and establishment of care following his discharge from [facility] on 04/14/22. [facility] notes were reviewed.   Patient reports being stable since his discharge from the inpatient psychiatric unit.  This was his sixth psychiatric hospitalization following a manic episode.  He reports during episodes of mania he is grandiose, paranoid, has increased energy, does not sleep, is reckless, and impulsive.  Patient reports that this episode occurred despite continuing his medications and believes it is the increased stressors that led to this occurring.  Since stabilizing on the unit he has returned back to his baseline.  At this present time patient meets criteria for schizoaffective disorder bipolar type and would benefit from continuing on his current medication regimen.  Counseling was provided for the risk of repeated episodes of mania, medication benefits and side effects, risk of substance use, and interaction of cigarette smoke with the medication.  Also discussed starting a as needed medication to be used the patient has not slept in 48 hours with the goal of preventing a psychiatric hospitalization.  Nathan Holloway presents for follow-up evaluation. 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  Despite all the stressors patient reports that he has been handling it well and that his mood has been improving.  He reports looking on the positive side of  things and how this other than the opportunity to focus on himself and grow from these experiences.  He denies this being concerning for mania as he is still sleeping well, has not grandiose, and has not become more disorganized.  Patient has gotten another job which she enjoys and is looking into reenrolling in college to finish his degree in psychology.  Patient notes improvement in energy levels and fatigue symptoms since tapering the Zyprexa to 15 mg and changing the Depakote dosing.  He also notes that the weight gain has stabilized and he is started a consistent diet and exercise routine.  Will continue on his current regimen and follow-up in a month.  Plan:  - Decrease to Zyprexa 15 mg QHS - Change Depakene to 250 mg MG in the morning and 750 mg QHS - Depakote level was 48 on 04/13/22 - Continue Atarax 25 mg TID prn for anxiety - Continue Trazodone 150 mg QHS prn for insomnia - Continue Ativan 2 mg QHS prn for 24-48 hour period of insomnia - Discontinue Nicoderm  patch for nicotine dependence as patient no longer taking - Follow up in a month - FMLA updated, patient to provide request for further progress note updates    Chief Complaint:  Chief Complaint  Patient presents with   Follow-up   Medication Refill   HPI: Patient presents reporting that a lot has happened over the past month but overall he feels like he is still doing well and in fact improving.  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  He also left his classes at the IT training camp as it was financially taxing and taking up a lot of his time.  Patient was reimbursed his tuition on a prorated basis which was ideal.  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  Another thing has been difficult over the past month is him and his girlfriend breaking up last week.  He notes that she broke up with him and  it came out of nowhere.  The 2 of them had recently gotten an apartment together but they were living in and now her living situation has been extremely uncomfortable.  He notes that they are trying to figure out a way to get out of the lease however it was already tight when they were both together and now it is only gotten harder.  Despite all these stressors patient reports that his mood has been good.  He has been looking at the break-up is in a positive light as it now gives him the chance to focus more on himself and be the man he would like to be.  He is considering going back to college at [employer] to finish out his degree in psychology for which he has 1 more year left.  Patient denies any signs of mania and notes that his sleep has remained good.  He has been taking the medications at the decreased doses and feels that this has greatly improved his fatigue and energy levels.  Nathan Holloway notes that he does not feel over sedated anymore and has been able to get on a consistent exercise and diet routine.  While he is yet to lose any weight he notes that he has stopped putting on weight since the decrease.  Patient believes he gained about 40 pounds in the few months that he was on the Depakote and Zyprexa at the increased doses.  We will continue on his current regimen and follow-up in a month.  Past Psychiatric History: Has had about 6 hospitalizations since then 3 in [place] and 3 in this area. He has tried abilify which made him nauseas, risperdal which gave a dystonic reaction, and haldol which gave dystonic reaction. He currently takes atarax if he thinks he is "going to much", trazodone rarely for sleep, Zyprexa and, Depakene. He prefers liquids to taking pills.  Past Medical History:  Past Medical History:  Diagnosis Date   Manic behavior (HCC)    Psychotic affective disorder (HCC) 08/30/2015   No past surgical history on file.  Family Psychiatric History: Believes patients mother and father both  had undiagnosed depression. His mothers uncle had schizoaffective disorder.  Family History:  Family History  Problem Relation Age of Onset   Healthy Mother     Social History:  Social History   Socioeconomic History   Marital status: Single    Spouse name: Not on file   Number of children: Not on file   Years of education: Not on file   Highest education level: Not on file  Occupational History   Not on file  Tobacco Use   Smoking status: Never   Smokeless tobacco: Never  Vaping Use   Vaping Use: Every day   Substances: Nicotine, Flavoring  Substance and Sexual Activity   Alcohol use: No   Drug use: Not Currently    Types: Marijuana   Sexual activity: Yes  Other Topics Concern   Not on file  Social  History Narrative   Not on file   Social Determinants of Health   Financial Resource Strain: Low Risk  (09/18/2020)   Overall Financial Resource Strain (CARDIA)    Difficulty of Paying Living Expenses: Not very hard  Food Insecurity: No Food Insecurity (09/18/2020)   Hunger Vital Sign    Worried About Running Out of Food in the Last Year: Never true    Ran Out of Food in the Last Year: Never true  Transportation Needs: No Transportation Needs (09/18/2020)   PRAPARE - Administrator, Civil Service (Medical): No    Lack of Transportation (Non-Medical): No  Physical Activity: Sufficiently Active (09/18/2020)   Exercise Vital Sign    Days of Exercise per Week: 4 days    Minutes of Exercise per Session: 60 min  Stress: No Stress Concern Present (09/18/2020)   [employer] of Occupational Health - Occupational Stress Questionnaire    Feeling of Stress : Not at all  Social Connections: Socially Isolated (09/18/2020)   Social Connection and Isolation Panel [NHANES]    Frequency of Communication with Friends and Family: More than three times a week    Frequency of Social Gatherings with Friends and Family: Twice a week    Attends Religious Services: Never     Database administrator or Organizations: No    Attends Banker Meetings: Never    Marital Status: Never married    Allergies:  Allergies  Allergen Reactions   Risperidone And Related Other (See Comments)    Diastatic   Milk (Cow) Other (See Comments)    GI Upset   Pork-Derived Products Other (See Comments)    GI Upset   Risperdal [Risperidone] Other (See Comments)    Patient states he almost died taking risperdal. Unable to give specific reactions    Current Medications: Current Outpatient Medications  Medication Sig Dispense Refill   hydrOXYzine (ATARAX) 25 MG tablet Take 1 tablet (25 mg total) by mouth 3 (three) times daily as needed for anxiety. 30 tablet 0   LORazepam (ATIVAN) 2 MG tablet Take 1 tablet (2 mg total) by mouth every 8 (eight) hours as needed for anxiety. To be taken if patient has not slept in 24-48 hours 7 tablet 0   nicotine (NICODERM CQ - DOSED IN MG/24 HOURS) 14 mg/24hr patch Place 1 patch (14 mg total) onto the skin daily. 28 patch 0   OLANZapine (ZYPREXA) 15 MG tablet Take 1 tablet (15 mg total) by mouth at bedtime. 90 tablet 0   traZODone (DESYREL) 150 MG tablet Take 1 tablet (150 mg total) by mouth at bedtime as needed for sleep. 30 tablet 0   valproic acid (DEPAKENE) 250 MG/5ML solution Take 10 mLs (500 mg total) by mouth 2 (two) times daily. Take 5 ml (250 mg) in the morning and 15 mg (750 ml) at night 600 mL 2   No current facility-administered medications for this visit.     Psychiatric Specialty Exam: Review of Systems  There were no vitals taken for this visit.There is no height or weight on file to calculate BMI.  General Appearance: Well Groomed  Eye Contact:  Good  Speech:  Clear and Coherent and Normal Rate  Volume:  Normal  Mood:  Euthymic  Affect:  Congruent  Thought Process:  Coherent and Goal Directed  Orientation:  Full (Time, Place, and Person)  Thought Content: Logical   Suicidal Thoughts:  No  Homicidal Thoughts:   No  Memory:  NA  Judgement:  Good  Insight:  Fair  Psychomotor Activity:  Normal  Concentration:  Concentration: Good  Recall:  Good  Fund of Knowledge: Good  Language: Good  Akathisia:  No    AIMS (if indicated): not done  Assets:  Architect Housing Physical Health  ADL's:  Intact  Cognition: WNL  Sleep:  Good   Metabolic Disorder Labs: Lab Results  Component Value Date   HGBA1C 4.3 (L) 04/07/2022   MPG 76.71 04/07/2022   MPG 85.32 08/06/2020   Lab Results  Component Value Date   PROLACTIN 11.2 04/07/2022   PROLACTIN 3.7 (L) 07/04/2020   Lab Results  Component Value Date   CHOL 218 (H) 04/07/2022   TRIG 75 04/07/2022   HDL 80 04/07/2022   CHOLHDL 2.7 04/07/2022   VLDL 15 04/07/2022   LDLCALC 123 (H) 04/07/2022   LDLCALC 163 (H) 12/29/2020   Lab Results  Component Value Date   TSH 1.405 04/07/2022   TSH 0.958 07/04/2020    Therapeutic Level Labs: No results found for: "LITHIUM" Lab Results  Component Value Date   VALPROATE 48 (L) 04/13/2022   VALPROATE <10 (L) 04/07/2022   No results found for: "CBMZ"   Screenings: AIMS    Flowsheet Row [facility]) [facility] ([facility]) [facility]  AIMS Total Score 0 0 0      AUDIT    Flowsheet Row [facility]) [facility] Ad[facility] (Dis[facility] (Dis[facility]  Alcohol Use Disorder Identification Test Final Score (AUDIT) 4 0 0      GAD-7    Flowsheet Row Counselor from 05/17/2022 in [facility] [place] Video Visit from 12/30/2020 in  [facility] Clinical Support from 10/06/2020 in [facility] Counselor from 09/18/2020 in [facility]  Total GAD-7 Score 6 1 4 3       PHQ2-9    Flowsheet Row Counselor from 05/17/2022 in [facility]PATIENT [facility]APY [place] Office Visit from [facility]20[facility] from 04/07/2022 in [facility] Video Visit from 12/30/2020 in [facility] Counselor from 12/24/2020 in [facility]  PHQ-2 Total Score 1 1 2  0 2  PHQ-9 Total Score 12 -- 9 2 8       Flowsheet Row Counselor from 05/17/2022 in [facility] [place] Office Visit from 05[facility]03/2022 [facility] Admission (Discharged) [facility]  C-SSRS RISK CATEGORY No Risk No Risk No Risk       Collaboration of Care: Collaboration of Care: Medication Management AEB medication prescription  Patient/Guardian was advised Release of Information must be obtained prior to any record release in order to collaborate their care with an outside provider. Patient/Guardian was advised if they have not already done so to contact the registration department to sign all necessary forms in order for 05/19/2022 to release information regarding their care.   Consent: Patient/Guardian gives verbal consent for treatment and assignment of benefits for services provided during this visit. Patient/Guardian expressed understanding and agreed to proceed.    07/03/2022,  MD 07/07/2022, 2:31 PM   Virtual Visit via Video Note  I connected with Nathan Holloway on 07/07/22 at  2:30 PM EST by a video enabled telemedicine application and verified that I am speaking with the correct person using two identifiers.  Location: Patient: Home Provider:  Home Office   I discussed the limitations of evaluation and management by telemedicine and the availability of in person appointments. The patient expressed understanding and agreed to proceed.   I discussed the assessment and treatment plan with the patient. The patient was provided an opportunity to ask questions and all were answered. The patient agreed with the plan and demonstrated an understanding of the instructions.   The patient was advised to call back or seek an in-person evaluation if the symptoms worsen or if the condition fails to improve as anticipated.  I provided 20 minutes of non-face-to-face time during this encounter.   Stasia Cavalier, MD

## 2022-08-05 ENCOUNTER — Telehealth (HOSPITAL_COMMUNITY): Payer: BC Managed Care – PPO | Admitting: Psychiatry

## 2022-08-26 ENCOUNTER — Other Ambulatory Visit (HOSPITAL_COMMUNITY): Payer: Self-pay | Admitting: Psychiatry

## 2022-08-26 DIAGNOSIS — F250 Schizoaffective disorder, bipolar type: Secondary | ICD-10-CM

## 2022-09-23 ENCOUNTER — Encounter (HOSPITAL_COMMUNITY): Payer: Self-pay

## 2022-09-23 ENCOUNTER — Telehealth (HOSPITAL_BASED_OUTPATIENT_CLINIC_OR_DEPARTMENT_OTHER): Payer: No Payment, Other | Admitting: Psychiatry

## 2022-09-23 ENCOUNTER — Encounter (HOSPITAL_COMMUNITY): Payer: Self-pay | Admitting: Psychiatry

## 2022-09-23 DIAGNOSIS — F250 Schizoaffective disorder, bipolar type: Secondary | ICD-10-CM

## 2022-09-23 MED ORDER — LURASIDONE HCL 20 MG PO TABS: ORAL_TABLET | ORAL | 0 refills | Status: DC

## 2022-09-23 MED ORDER — OXCARBAZEPINE 150 MG PO TABS: 150.0000 mg | ORAL_TABLET | Freq: Two times a day (BID) | ORAL | 0 refills | Status: DC

## 2022-09-23 NOTE — Progress Notes (Signed)
BH MD/PA/NP OP Progress Note  09/23/2022 10:28 AM Nathan Holloway  MRN:  [id number]  Visit Diagnosis:    ICD-10-CM   1. Schizoaffective disorder, bipolar type [facility])  F25.0       Assessment: Nathan Holloway is a 27 y.o. male with a history of schizoaffective disorder bipolar type who presented to [facility] at [facility] for initial evaluation and establishment of care following his discharge from [facility] on 04/14/22.   At initial evaluation patient reported being stable since his discharge from the inpatient psychiatric unit.  This was his sixth psychiatric hospitalization following a manic episode.  He reports during episodes of mania he is grandiose, paranoid, has increased energy, does not sleep, is reckless, and impulsive.  Patient reported that this episode occurred despite continuing his medications and believes it was the increased stressors that led to this occurring.  Since stabilizing on the unit he has returned back to his baseline. Patient meets criteria for schizoaffective disorder bipolar type and would benefit from continuing on his current medication regimen.  Counseling was provided for the risk of repeated episodes of mania, medication benefits and side effects, risk of substance use, and interaction of cigarette smoke with the medication.    Over the next several months patient endorsed significant weight gain and oversedation on the Zyprexa and Depakene resulting in the doses being tapered to Zyprexa 15 mg QHS and Depakene 250 mg QAM and 750 mg QHS. While the weight gain stabilized, patient reported that he remained oversedated.  Nathan Holloway presents for follow-up evaluation. Today, 09/23/22, patient reports that the oversedation has become unmanageable over the past couple months.  He reports it interfering with his ability to stay awake at work and while driving.  He discontinued the Depakene and will only take it once every week or 2.  Even with discontinuing  this patient still reported feeling over sedated and thus was only taking Zyprexa every other day.  Patient expressed interest in changing to less sedating medications such as Lamictal.  We discussed his current regimen and explained how he is on mood stabilizer and an antipsychotic and Lamictal at provide no antipsychotic coverage with limited ability to prevent manic episodes.  Which is further complicated by the fact that he has a slow titration.  Alternatives were suggested such as Abilify, Risperdal, or Invega however patient declined as he has had adverse side effects from these in the past.  Patient was agreeable to trying Latuda and it was explained that this is primarily used for bipolar depression and less so mania.  He was open to taking another mood stabilizer with this and we discussed Trileptal along with its risk and benefits.  Of note patient lost his insurance and expressed financial concerns.  We suggested transitioning to behavioral health urgent care due to his lack of insurance which she was open to.  We also discussed the concerns that Jordan may be cost prohibitive which patient acknowledged but felt like it would not be a concern.  Patient was arranged with follow-up appointment at [facility] and will be seen in 3 to 4 weeks.  He is aware that he can reach out to if something were to occur in the interim.  Plan:  - Taper Zyprexa to 10 mg QHS upon starting Latuda and decrease by 5 mg every 3 days until off the medicine - Start Latuda 20 mg w/dinner and increase by 20 mg every 3 days until a dose of 80 mg w/dinner - Start  Trileptal 150 mg BID - Discontinue Depakene to 250 mg MG in the morning and 750 mg QHS, due to oversedation - Depakote level was 48 on 04/13/22 - Continue Atarax 25 mg TID prn for anxiety - Discontinue Trazodone 150 mg QHS prn for insomnia due to oversedation - Continue Ativan 2 mg QHS prn for 24-48 hour period of insomnia, patient has yet to need this - Discontinue  Nicoderm  patch for nicotine dependence as patient no longer taking - Transfer to [facility] due to loss of insurance and follow up in 3-4 weeks - FMLA updated, patient to provide request for further progress note updates    Chief Complaint:  Chief Complaint  Patient presents with   Follow-up   Medication Refill   HPI: Patient presents reporting that the last couple months have been alright but not great for him.  He denies any manic symptoms or concerns or paranoia, however the over sedation from the medication has become unbearable for him.  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  Due to this he decided to discontinue the Depakene and while he noted some improvement he feels that it was not substantial enough.  Patient then opted to discontinue Zyprexa and will only take it around every other day now.  Patient notes that when he does not take it he has increased energy and is able to go about his day.  Nathan Holloway denies any change in his sleep when he does not take the medication.  He reports sleeping around 8 hours on weekdays and 10 hours on the weekend.  The only difference being if he takes medication he feels over fatigued the following day.  He denies taking the trazodone or having picked up the Ativan from the pharmacy.  We discussed treatment options to help decrease sedation such as reducing the doses of Zyprexa or Depakene however the patient was uninterested in both of these.  He was interested in changing to a new medication and has suggested Lamictal.  We discussed Lamictal and went over its recommended uses as well as his limited ability to manage manic symptoms.  Other alternative antipsychotic options were discussed that had mood stabilization properties however patient reports adverse side effects from Abilify, Risperdal, Invega in the past.  He was open to trying Jordan though it was explained that this is primarily for bipolar  depression and has less stabilization for manic phases.  It was suggested that a new mood stabilizer be added.  Discussed the long titration period of Lamictal and explained that Trileptal has better comparative evidence for being protective against future manic episodes.  Did discuss the potential side effects of both Latuda and Trileptal.  Patient also noted that he no longer has insurance and thus has some financial concerns.  We discussed how Latuda could potentially cause prohibitive however he was not concerned.  Patient was however interested in changing his psychiatric care over to behavioral health urgent care due to his lack of insurance.  Past Psychiatric History: Has had about 6 hospitalizations since then 3 in [place] and 3 in this area. He has tried abilify which made him nauseas, risperdal which gave a dystonic reaction, and haldol which gave dystonic reaction. He currently takes atarax if he thinks he is "going to much", trazodone rarely for sleep, Zyprexa, Risperdal, and, Depakene. He prefers liquids to taking pills.  Past Medical History:  Past Medical History:  Diagnosis Date  Manic behavior ([place])    Psychotic affective disorder ([place]) 08/30/2015   No past surgical history on file.  Family Psychiatric History: Believes patients mother and father both had undiagnosed depression. His mothers uncle had schizoaffective disorder.  Family History:  Family History  Problem Relation Age of Onset   Healthy Mother     Social History:  Social History   Socioeconomic History   Marital status: Single    Spouse name: Not on file   Number of children: Not on file   Years of education: Not on file   Highest education level: Not on file  Occupational History   Not on file  Tobacco Use   Smoking status: Never   Smokeless tobacco: Never  Vaping Use   Vaping Use: Every day   Substances: Nicotine, Flavoring  Substance and Sexual Activity   Alcohol use: No   Drug use: Not  Currently    Types: Marijuana   Sexual activity: Yes  Other Topics Concern   Not on file  Social History Narrative   Not on file   Social Determinants of Health   Financial Resource Strain: Low Risk  (09/18/2020)   Overall Financial Resource Strain (CARDIA)    Difficulty of Paying Living Expenses: Not very hard  Food Insecurity: No Food Insecurity (09/18/2020)   Hunger Vital Sign    Worried About Running Out of Food in the Last Year: Never true    Ran Out of Food in the Last Year: Never true  Transportation Needs: No Transportation Needs (09/18/2020)   PRAPARE - Hydrologist (Medical): No    Lack of Transportation (Non-Medical): No  Physical Activity: Sufficiently Active (09/18/2020)   Exercise Vital Sign    Days of Exercise per Week: 4 days    Minutes of Exercise per Session: 60 min  Stress: No Stress Concern Present (09/18/2020)   [place]    Feeling of Stress : Not at all  Social Connections: Socially Isolated (09/18/2020)   Social Connection and Isolation Panel [NHANES]    Frequency of Communication with Friends and Family: More than three times a week    Frequency of Social Gatherings with Friends and Family: Twice a week    Attends Religious Services: Never    Marine scientist or Organizations: No    Attends Archivist Meetings: Never    Marital Status: Never married    Allergies:  Allergies  Allergen Reactions   Risperidone And Related Other (See Comments)    Diastatic   Milk (Cow) Other (See Comments)    GI Upset   Pork-Derived Products Other (See Comments)    GI Upset   Risperdal [Risperidone] Other (See Comments)    Patient states he almost died taking risperdal. Unable to give specific reactions    Current Medications: Current Outpatient Medications  Medication Sig Dispense Refill   hydrOXYzine (ATARAX) 25 MG tablet Take 1 tablet (25 mg total) by  mouth 3 (three) times daily as needed for anxiety. 30 tablet 0   LORazepam (ATIVAN) 2 MG tablet Take 1 tablet (2 mg total) by mouth every 8 (eight) hours as needed for anxiety. To be taken if patient has not slept in 24-48 hours 7 tablet 0   nicotine (NICODERM CQ - DOSED IN MG/24 HOURS) 14 mg/24hr patch Place 1 patch (14 mg total) onto the skin daily. 28 patch 0   OLANZapine (ZYPREXA) 15 MG tablet Take 1  tablet (15 mg total) by mouth at bedtime. 90 tablet 0   traZODone (DESYREL) 150 MG tablet Take 1 tablet (150 mg total) by mouth at bedtime as needed for sleep. 30 tablet 0   valproic acid (DEPAKENE) 250 MG/5ML solution Take 10 mLs (500 mg total) by mouth 2 (two) times daily. Take 5 ml (250 mg) in the morning and 15 mg (750 ml) at night 600 mL 1   No current facility-administered medications for this visit.     Psychiatric Specialty Exam: Review of Systems  There were no vitals taken for this visit.There is no height or weight on file to calculate BMI.  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 Physical Health  ADL's:  Intact  Cognition: WNL  Sleep:  Good   Metabolic Disorder Labs: Lab Results  Component Value Date   HGBA1C 4.3 (L) 04/07/2022   MPG 76.71 04/07/2022   MPG 85.32 08/06/2020   Lab Results  Component Value Date   PROLACTIN 11.2 04/07/2022   PROLACTIN 3.7 (L) 07/04/2020   Lab Results  Component Value Date   CHOL 218 (H)  04/07/2022   TRIG 75 04/07/2022   HDL 80 04/07/2022   CHOLHDL 2.7 04/07/2022   VLDL 15 04/07/2022   LDLCALC 123 (H) 04/07/2022   LDLCALC 163 (H) 12/29/2020   Lab Results  Component Value Date   TSH 1.405 04/07/2022   TSH 0.958 07/04/2020    Therapeutic Level Labs: No results found for: "LITHIUM" Lab Results  Component Value Date   VALPROATE 48 (L) 04/13/2022   VALPROATE <10 (L) 04/07/2022   No results found for: "CBMZ"   Screenings: [place] Admission (Discharged) from 04/08/2022 in [place] [facility] Admission (Discharged) from 08/07/2020 in [place] [facility] Admission (Discharged) from 07/05/2020 in [place] [facility]  AIMS Total Score 0 0 0      AUDIT    Flowsheet Row Admission (Discharged) from 04/08/2022 in [place] [facility] Admission (Discharged) from 08/07/2020 in [place] [facility] Admission (Discharged) from 07/05/2020 in [place] [facility]  Alcohol Use Disorder Identification Test Final Score (AUDIT) 4 0 0      GAD-7    Flowsheet Row Counselor from 05/17/2022 in [place] [facility] [place] Visit from 12/30/2020 in [place] from 10/06/2020 in [facility] Counselor from 09/18/2020 in [facility]  Total GAD-7 Score 6 1 4 3       PHQ2-9    Flowsheet Row Counselor from 05/17/2022 in [place] [facility] [place] from 05/03/2022 in [facility] from 04/07/2022 in [facility] Video Visit from 12/30/2020 in [facility]  Counselor from 12/24/2020 in [facility]  PHQ-2 Total Score 1 1 2  0 2  PHQ-9 Total  Score 12 -- 9 2 8       Flowsheet Row Counselor from 05/17/2022 in [place] [facility] [place] from 05/03/2022 in [facility] Admission (Discharged) from 04/08/2022 in [place] [facility]  C-SSRS RISK CATEGORY No Risk No Risk No Risk       Collaboration of Care: Collaboration of Care: Medication Management AEB medication prescription  Patient/Guardian was advised Release of Information must be obtained prior to any record release in order to collaborate their care with an outside provider. Patient/Guardian was advised if they have not already done so to contact the registration department to sign all necessary forms in order for [place] to release information regarding their care.   Consent: Patient/Guardian gives verbal consent for treatment and assignment of benefits for services provided during this visit. Patient/Guardian expressed understanding and agreed to proceed.    Vista Mink, MD 09/23/2022, 10:28 AM  40 minutes were spent in chart review, interview, psycho education, counseling, medical decision making, coordination of care and long-term prognosis.  Patient was given opportunity to ask question and all concerns and questions were addressed and answers. Excluding separately billable services.   Virtual Visit via Video Note  I connected with Nathan Holloway on 09/23/22 at  8:30 AM EST by a video enabled telemedicine application and verified that I am speaking with the correct person using two identifiers.  Location: Patient: Home Provider: Home Office   I discussed the limitations of evaluation and management by telemedicine and the availability of in person appointments. The patient expressed understanding and agreed to proceed.   I discussed the assessment and treatment plan with the patient. The patient was provided an opportunity to ask questions and all were answered. The  patient agreed with the plan and demonstrated an understanding of the instructions.   The patient was advised to call back or seek an in-person evaluation if the symptoms worsen or if the condition fails to improve as anticipated.  I provided 40 minutes of non-face-to-face time during this encounter.   Vista Mink, MD

## 2022-10-14 ENCOUNTER — Encounter (HOSPITAL_COMMUNITY): Payer: Self-pay

## 2022-10-14 ENCOUNTER — Ambulatory Visit (HOSPITAL_COMMUNITY): Payer: Self-pay | Admitting: Physician Assistant

## 2023-02-14 ENCOUNTER — Ambulatory Visit (HOSPITAL_COMMUNITY)
Admission: EM | Admit: 2023-02-14 | Discharge: 2023-02-15 | Disposition: A | Discharge status: Other Acute Inpatient Hospital | Payer: BLUE CROSS/BLUE SHIELD | Attending: Psychiatry | Admitting: Psychiatry

## 2023-02-14 DIAGNOSIS — F308 Other manic episodes: Secondary | ICD-10-CM | POA: Insufficient documentation

## 2023-02-14 DIAGNOSIS — F250 Schizoaffective disorder, bipolar type: Secondary | ICD-10-CM | POA: Insufficient documentation

## 2023-02-14 DIAGNOSIS — Z1152 Encounter for screening for COVID-19: Secondary | ICD-10-CM | POA: Insufficient documentation

## 2023-02-14 DIAGNOSIS — F3010 Manic episode without psychotic symptoms, unspecified: Secondary | ICD-10-CM | POA: Diagnosis not present

## 2023-02-14 DIAGNOSIS — Z818 Family history of other mental and behavioral disorders: Secondary | ICD-10-CM | POA: Diagnosis not present

## 2023-02-14 DIAGNOSIS — Z91148 Patient's other noncompliance with medication regimen for other reason: Secondary | ICD-10-CM | POA: Insufficient documentation

## 2023-02-14 LAB — LIPID PANEL
Cholesterol: 197 mg/dL (ref 0–200)
HDL: 71 mg/dL (ref >40)
LDL Cholesterol: 117 mg/dL — ABNORMAL HIGH (ref 0–99)
Total CHOL/HDL Ratio: 2.8 RATIO
Triglycerides: 45 mg/dL (ref <150)
VLDL: 9 mg/dL (ref 0–40)

## 2023-02-14 LAB — CBC WITH DIFFERENTIAL/PLATELET
Abs Immature Granulocytes: 0.01 10*3/uL (ref 0.00–0.07)
Basophils Absolute: 0 10*3/uL (ref 0.0–0.1)
Basophils Relative: 1 %
Eosinophils Absolute: 0 10*3/uL (ref 0.0–0.5)
Eosinophils Relative: 0 %
HCT: 45.3 % (ref 39.0–52.0)
Hemoglobin: 15 g/dL (ref 13.0–17.0)
Immature Granulocytes: 0 %
Lymphocytes Relative: 26 %
Lymphs Abs: 2.2 10*3/uL (ref 0.7–4.0)
MCH: 27.8 pg (ref 26.0–34.0)
MCHC: 33.1 g/dL (ref 30.0–36.0)
MCV: 83.9 fL (ref 80.0–100.0)
Monocytes Absolute: 0.7 10*3/uL (ref 0.1–1.0)
Monocytes Relative: 8 %
Neutro Abs: 5.5 10*3/uL (ref 1.7–7.7)
Neutrophils Relative %: 65 %
Platelets: 266 10*3/uL (ref 150–400)
RBC: 5.4 MIL/uL (ref 4.22–5.81)
RDW: 12.1 % (ref 11.5–15.5)
WBC: 8.4 10*3/uL (ref 4.0–10.5)
nRBC: 0 % (ref 0.0–0.2)

## 2023-02-14 LAB — COMPREHENSIVE METABOLIC PANEL
ALT: 15 U/L (ref 0–44)
AST: 19 U/L (ref 15–41)
Albumin: 4.6 g/dL (ref 3.5–5.0)
Alkaline Phosphatase: 59 U/L (ref 38–126)
Anion gap: 11 (ref 5–15)
BUN: 14 mg/dL (ref 6–20)
CO2: 24 mmol/L (ref 22–32)
Calcium: 9.8 mg/dL (ref 8.9–10.3)
Chloride: 101 mmol/L (ref 98–111)
Creatinine, Ser: 1.29 mg/dL — ABNORMAL HIGH (ref 0.61–1.24)
GFR, Estimated: >60 mL/min (ref >60)
Glucose, Bld: 77 mg/dL (ref 70–99)
Potassium: 3.7 mmol/L (ref 3.5–5.1)
Sodium: 136 mmol/L (ref 135–145)
Total Bilirubin: 1.2 mg/dL (ref 0.3–1.2)
Total Protein: 7.7 g/dL (ref 6.5–8.1)

## 2023-02-14 LAB — POCT URINE DRUG SCREEN - MANUAL ENTRY (I-SCREEN)
POC Amphetamine UR: NOT DETECTED
POC Buprenorphine (BUP): NOT DETECTED
POC Cocaine UR: NOT DETECTED
POC Marijuana UR: NOT DETECTED
POC Methadone UR: NOT DETECTED
POC Methamphetamine UR: NOT DETECTED
POC Morphine: NOT DETECTED
POC Oxazepam (BZO): NOT DETECTED
POC Oxycodone UR: NOT DETECTED
POC Secobarbital (BAR): NOT DETECTED

## 2023-02-14 LAB — TSH: TSH: 1.688 u[IU]/mL (ref 0.350–4.500)

## 2023-02-14 LAB — SARS CORONAVIRUS 2 BY RT PCR: SARS Coronavirus 2 by RT PCR: NEGATIVE

## 2023-02-14 LAB — MAGNESIUM: Magnesium: 1.9 mg/dL (ref 1.7–2.4)

## 2023-02-14 LAB — ETHANOL: Alcohol, Ethyl (B): <10 mg/dL (ref <10)

## 2023-02-14 MED ORDER — HYDROXYZINE HCL 25 MG PO TABS
25.0000 mg | ORAL_TABLET | Freq: Three times a day (TID) | ORAL | Status: DC | PRN
  Administered 2023-02-14 – 2023-02-15 (×3): 25 mg via ORAL
  Filled 2023-02-14 (×3): qty 1

## 2023-02-14 MED ORDER — TRAZODONE HCL 50 MG PO TABS
50.0000 mg | ORAL_TABLET | Freq: Every evening | ORAL | Status: DC | PRN
  Administered 2023-02-14: 50 mg via ORAL
  Filled 2023-02-14: qty 1

## 2023-02-14 MED ORDER — DIVALPROEX SODIUM 250 MG PO DR TAB
250.0000 mg | DELAYED_RELEASE_TABLET | Freq: Two times a day (BID) | ORAL | Status: DC
  Administered 2023-02-14 – 2023-02-15 (×3): 250 mg via ORAL
  Filled 2023-02-14 (×3): qty 1

## 2023-02-14 MED ORDER — ACETAMINOPHEN 325 MG PO TABS: 650.0000 mg | ORAL_TABLET | Freq: Four times a day (QID) | ORAL | Status: DC | PRN

## 2023-02-14 MED ORDER — OLANZAPINE 5 MG PO TBDP
5.0000 mg | ORAL_TABLET | Freq: Two times a day (BID) | ORAL | Status: DC
  Administered 2023-02-14 – 2023-02-15 (×2): 5 mg via ORAL
  Filled 2023-02-14 (×2): qty 1

## 2023-02-14 MED ORDER — LORAZEPAM 1 MG PO TABS
1.0000 mg | ORAL_TABLET | Freq: Once | ORAL | Status: AC
  Administered 2023-02-14: 1 mg via ORAL
  Filled 2023-02-14: qty 1

## 2023-02-14 MED ORDER — OLANZAPINE 5 MG PO TBDP
5.0000 mg | ORAL_TABLET | ORAL | Status: DC
  Filled 2023-02-14: qty 1

## 2023-02-14 MED ORDER — OLANZAPINE 10 MG IM SOLR
5.0000 mg | Freq: Two times a day (BID) | INTRAMUSCULAR | Status: DC
  Administered 2023-02-14: 5 mg via INTRAMUSCULAR
  Filled 2023-02-14: qty 10

## 2023-02-14 MED ORDER — OLANZAPINE 5 MG PO TABS: 5.0000 mg | ORAL_TABLET | Freq: Two times a day (BID) | ORAL | Status: DC

## 2023-02-14 MED ORDER — ALUM & MAG HYDROXIDE-SIMETH 200-200-20 MG/5ML PO SUSP: 30.0000 mL | ORAL | Status: DC | PRN

## 2023-02-14 MED ORDER — MAGNESIUM HYDROXIDE 400 MG/5ML PO SUSP: 30.0000 mL | Freq: Every day | ORAL | Status: DC | PRN

## 2023-02-14 MED ORDER — OLANZAPINE 5 MG PO TABS
5.0000 mg | ORAL_TABLET | Freq: Two times a day (BID) | ORAL | Status: DC
  Filled 2023-02-14: qty 1

## 2023-02-14 NOTE — Progress Notes (Signed)
This CSW was informed by nursing team at [facility] that pt will NOT transfer to [facility] due to not having transport back to local environment post discharge.   This CSW has spoke with [facility]andra who has agreed to re-review pt's referral. CSW manually sent referral to [facility] due to listed fax number in EPIC is not the fasted processing for [facility] intake. CSW sent to OV at [phone].  Care Team notified: [facility] Rona Ravens, RN. Vernard Gambles, NP, Durwin Reges, RN, Regan Lemming, NP, Cathie Beams, LCSW, [facility], LPN   Maryjean Ka, MSW, [facility] 02/14/2023 7:18 PM

## 2023-02-14 NOTE — Progress Notes (Signed)
Pt was accepted to [facility] TOMORROW 02/15/2023; Bed Assignment Deatra Canter A  Pt meets inpatient criteria per Julaine Fusi  Attending Physician will be Dr. Rush Farmer. Robet Leu, MD  Report can be called to: - [phone] or [phone]  Pt can arrive after: 8:00am  Care Team notified:[facility] Rona Ravens, RN. Vernard Gambles, NP, Durwin Reges, RN, Regan Lemming, NP, Cathie Beams, LCSW, Rodney Langton, LPN, Hansel Starling, RN    Kelton Pillar, LCSWA 02/14/2023 @ 7:45 PM

## 2023-02-14 NOTE — Progress Notes (Signed)
Pt was accepted to [facility] TODAY 02/14/2023, pending negative covid faxed to [phone] or [phone]. Bed assignment: 5th floor  Pt meets inpatient criteria per Vernard Gambles, NP  Attending Physician will be Melody Comas, MD  Report can be called to: [phone] (ext. 2 or 3 for nurses station)  Pt can arrive after negative covid is received  Care Team Notified: Vernard Gambles, NP, Durwin Reges, RN, and Rona Ravens, RN  [place], LCSW  02/14/2023 3:40 PM

## 2023-02-14 NOTE — ED Notes (Signed)
Patient  sleeping in no acute stress. RR even and unlabored .Environment secured .Will continue to monitor for safely. 

## 2023-02-14 NOTE — Progress Notes (Signed)
LCSW Progress Note  [id number]   Nathan Holloway  02/14/2023  12:54 PM  Description:   Inpatient Psychiatric Referral  Patient was recommended inpatient per Vernard Gambles, NP. There are no available beds at [facility], per [facility] Rona Ravens, RN. Patient was referred to the following out of network facilities:   [facility] Provider Address Phone Fax  [facility]  [address] [place] [address] [phone]  [facility]  [address] [place] [address] [phone]  [facility]  [address], [place] [address] [phone]  [facility]  [address] [place], [place] [address] [phone]  [facility] [address] [place]  [address] [place] [address] [phone]  [facility]  [address] [place], [place] [address] [phone]  [facility]  [address] [place] [address] [phone]  [facility]  [address] [place] [address] [phone]  [facility]  [address] [place] [address] [place] [address] [phone]  [facility]  [address] [place], [place] [address] [phone]  [facility]  [address] [place] [address] [phone]  [facility]  [address] [place] [address] [phone]  [facility]  [address] [place] [address] [phone]  [facility]  [address] [place] [address] [phone]  [facility]  [address], [place] [address] [phone]  [facility]  [address], [place]  [place] [address] [phone]  [facility]  [address] [place] [address] [phone]  [facility]  [address] [place] [address] [phone]  [facility]  [address] [place] [address] [phone]  [facility]  [address], [place] [address] [phone]  [facility]  [address] [place] [address] [phone]  [facility]  [address], [place] [address] [phone]  [facility]  [address] [place] [address] [phone]  [facility]  [address] [place] [address] [phone]  [facility]  [address] [place] [address] [phone]    Situation ongoing, CSW to continue following and update chart as more information becomes available.      Cathie Beams, [place]  02/14/2023 12:54 PM

## 2023-02-14 NOTE — ED Notes (Signed)
Patient is walking around  milieu making the other patients anxious due to loud talking and walking up to them. Rn tired to redirect patient . Patient became very agitated with staff.Notified provider and asked if theb patient could have some medication to calm him down

## 2023-02-14 NOTE — ED Notes (Signed)
Patient in milieu. Environment is secured. Will continue to monitor for safety. 

## 2023-02-14 NOTE — ED Notes (Signed)
Pt pending [facility] for AM.

## 2023-02-14 NOTE — ED Notes (Signed)
Rn notified safe transport that patient will not be going to [facility] today.

## 2023-02-14 NOTE — ED Notes (Signed)
Patient was not able to go to [facility] due to not having transportation home once discharged.

## 2023-02-14 NOTE — ED Notes (Signed)
Pt admitted to obs . Denies SI/HI/AVH. Calm, cooperative throughout interview process. Skin assessment completed. Oriented to unit. Meal and drink offered. At currrent, pt continue to denies SI/HI/AVH. Pt verbally contract for safety. Will monitor for safety. 

## 2023-02-14 NOTE — ED Provider Notes (Signed)
Call contact from patient's mother Hendrixx [place].  Patient's mother had originally stated that she could provide transportation from [facility] to home once he is discharged from [facility].  Reports she had family members who had agreed to provide that transportation.  However, her family member realized they would not be able to travel that distance.  She is extremely pleasant and asked if there was any way patient could be placed in a facility closer to home due to transportation issues.  Notified social worker that patient will not be transferred to [facility].

## 2023-02-14 NOTE — ED Notes (Signed)
Pt calm and cooperative no c/o pain or distress noted pt watching television breathing even and unlabored. Will continue to monitor for safety

## 2023-02-14 NOTE — ED Notes (Signed)
Patient refused vitals.

## 2023-02-14 NOTE — Progress Notes (Signed)
   02/14/23 1046  [facility] Triage Screening (Walk-ins at [facility] only)  How Did You Hear About [place]? Family/Friend  What Is the Reason for Your Visit/Call Today? Pt presenting to [facility] voluntarily with his mother due to having an "manic episode". Pt has not been on medications for the past few months. Pt denies SI, HI, AVH alcohol and drug use.  How Long Has This Been Causing You Problems? 1 wk - 1 month  Have You Recently Had Any Thoughts About Hurting Yourself? No  Are You Planning to Commit Suicide/Harm Yourself At This time? No  Have you Recently Had Thoughts About Hurting Someone [name]? No  Are You Planning To Harm Someone At This Time? No  Are you currently experiencing any auditory, visual or other hallucinations? No  Have You Used Any Alcohol or Drugs in the Past 24 Hours? No  Do you have any current medical co-morbidities that require immediate attention? No  Clinician description of patient physical appearance/behavior: hypomanic  What Do You Feel Would Help You the Most Today? Treatment for Depression or other mood problem;Medication(s)  If access to [facility] was not available, would you have sought care in the Emergency Department? No  Determination of Need Urgent (48 hours)  Options For Referral Medication Management;Outpatient Therapy;Inpatient Hospitalization

## 2023-02-14 NOTE — ED Provider Notes (Signed)
[facility] Continuous Assessment Admission H&P  Date: 02/14/23 Patient Name: Nathan Holloway MRN: [id number] Chief Complaint: Manic episode  Diagnoses:  Final diagnoses:  Schizoaffective disorder, bipolar type (HCC)  Manic behavior (HCC)    HPI: patient presented to [facility] as a walk in accompanied by his mother with complaints of "I think I am having a manic episode".  Nathan Holloway, 27 y.o., male patient seen face to face by this provider and chart reviewed on 02/14/23.  Per chart review patient has a past psychiatric history of schizoaffective disorder bipolar type.  Patient has had multiple inpatient psychiatric admissions, with his last admission being 04/08/2022 at [facility].  Patient had services in place with Dr. Gillermina Phy for medication management and was prescribed Zyprexa 15 mg nightly, Depakote 250 mg twice daily, and trazodone 150 mg nightly.  Patient has not taken medications in several months, is unable to state how long.  He lives in the home with his mother.  He denies any substance use.  On evaluation Nathan Holloway reports he is currently employed but states his employer told him to take the week off to go take care of his mental health.  During evaluation Nathan Holloway is observed sitting in the assessment room in no acute distress.  He is fairly groomed and makes fleeting eye contact.  He is alert/oriented x 4 and cooperative.  He is hyperverbal and spends gentle.  His speech is clear and coherent but at a fast rate.  He has flight of ideas and is grandiose at times.  He is hyperreligious.  He is easily distracted and has to be redirected in conversation.  He is hyperactive.  He appears manic but is pleasant.  He denies any depressive symptoms but this triggered thoughts of when his grandfather died whom he was very close to.  He is unable to state when he has slept last.  Reports a weight loss due to no appetite.  He denies SI/HI/AVH.  He is paranoid.  He does not appear to  be responding to internal/external stimuli.  Discussed inpatient psychiatric admission with patient and he is in agreement.  Reports all he wants is to be able to get out of the psychiatric hospital by Sunday so he can go to church.  He remains voluntary at this time.  Nathan Holloway mother [phone].  Mother is extremely involved in patient's care.  She presents with patient to [facility] but does not go into the assessment room as to give patient his privacy.  Mother states that patient has been doing well for quite some time but he has not been taking his medications and when he does not take his medications he has "episodes".  States patient has a special cocktail of medications that he takes that works perfectly for him.  This regimen is Zyprexa 15 mg nightly, Depakote 250 mg twice daily, and trazodone 150 mg nightly.    Total Time spent with patient: 30 minutes  Musculoskeletal  Strength & Muscle Tone: within normal limits Gait & Station: normal Patient leans: N/A  Psychiatric Specialty Exam  Presentation General Appearance:  Appropriate for Environment; Fairly Groomed  Eye Contact: Good  Speech: Clear and Coherent  Speech Volume: Normal  Handedness: Right   Mood and Affect  Mood: Euthymic  Affect: Appropriate; Congruent   Thought Process  Thought Processes: Coherent  Descriptions of Associations:Intact  Orientation:Full (Time, Place and Person)  Thought Content:Logical  Diagnosis of Schizophrenia or Schizoaffective disorder in past: Yes (  schizoaffective disorder)   Hallucinations:No data recorded Ideas of Reference:None  Suicidal Thoughts:No data recorded Homicidal Thoughts:No data recorded  Sensorium  Memory: Immediate Good  Judgment: Good  Insight: Good   Executive Functions  Concentration: Good  Attention Span: Good  Recall: Good  Fund of Knowledge: Good  Language: Good   Psychomotor Activity  Psychomotor Activity:No data  recorded  Assets  Assets: Communication Skills   Sleep  Sleep:No data recorded  No data recorded  Physical Exam Vitals and nursing note reviewed.  Constitutional:      General: He is not in acute distress.    Appearance: He is well-developed.  HENT:     Head: Normocephalic.  Cardiovascular:     Rate and Rhythm: Normal rate.  Pulmonary:     Effort: Pulmonary effort is normal. No respiratory distress.  Musculoskeletal:        General: Normal range of motion.     Cervical back: Normal range of motion.  Skin:    Capillary Refill: Capillary refill takes less than 2 seconds.  Neurological:     Mental Status: He is alert and oriented to person, place, and time.  Psychiatric:        Attention and Perception: Perception normal. He is inattentive.        Mood and Affect: Mood is anxious and elated.        Speech: Speech is rapid and pressured and tangential.        Behavior: Behavior is hyperactive.        Thought Content: Thought content is paranoid.        Cognition and Memory: Cognition normal.        Judgment: Judgment is impulsive.    Review of Systems  Constitutional: Negative.   HENT: Negative.    Eyes: Negative.   Respiratory: Negative.    Cardiovascular: Negative.   Musculoskeletal: Negative.   Skin: Negative.   Neurological: Negative.   Psychiatric/Behavioral:  The patient is nervous/anxious and has insomnia.     Blood pressure (!) 144/79, pulse 90, temperature 99.4 F (37.4 C), temperature source Oral, resp. rate 18, height 5\' 8"  (1.727 m), weight 173 lb (78.5 kg), SpO2 99 %. Body mass index is 26.3 kg/m.  Past Psychiatric History: Schizoaffective disorder bipolar type  Is the patient at risk to self? No  Has the patient been a risk to self in the past 6 months? No .    Has the patient been a risk to self within the distant past? No   Is the patient a risk to others? No   Has the patient been a risk to others in the past 6 months? No   Has the patient  been a risk to others within the distant past? No   Past Medical History:  Manic behavior, psychotic affective disorder  Family History: OCD and bipolar disorder in mother and bipolar disorder in sister.  Denies any suicide in the family  Social History:  Religion: Religion/Spirituality Are You A Religious Person?: Yes What is Your Religious Affiliation?: Christian   Leisure/Recreation: Leisure / Recreation Do You Have Hobbies?: Yes   Exercise/Diet: Exercise/Diet Do You Exercise?: Yes Have You Gained or Lost A Significant Amount of Weight in the Past Six Months?: Yes-Gained Do You Follow a Special Diet?: No Do You Have Any Trouble Sleeping?: Yes (pt reports current hypersomnia)     CCA Employment/Education Employment/Work Situation: Employment / Work Situation Employment Situation: Employed Work Stressors: NONE REPORTED Patient's Job has Been Impacted  by Current Illness: No Has Patient ever Been in the Military?: No   Education: Education Is Patient Currently Attending School?: No Last Grade Completed: 12 Did You Attend College?: Yes Did You Have An Individualized Education Program (IIEP): No Did You Have Any Difficulty At School?: No     CCA Family/Childhood History Family and Relationship History: Family history Marital status: Single Does patient have children?: No   Childhood History:  Childhood History By whom was/is the patient raised?: Mother Did patient suffer any verbal/emotional/physical/sexual abuse as a child?: No Did patient suffer from severe childhood neglect?: No Has patient ever been sexually abused/assaulted/raped as an adolescent or adult?: No Was the patient ever a victim of a crime or a disaster?: No Witnessed domestic violence?: No Has patient been affected by domestic violence as an adult?: No    CCA Substance Use Alcohol/Drug Use: Alcohol / Drug Use Pain Medications: SEE MAR Prescriptions: SEE MAR Over the Counter: SEE  MAR History of alcohol / drug use?: No history of alcohol / drug abuse Longest period of sobriety (when/how long): 1 year Negative Consequences of Use: Work / School Withdrawal Symptoms: None  Last Labs:  No visits with results within 6 Month(s) from this visit.  Latest known visit with results is:  Admission on 04/08/2022, Discharged on 04/14/2022  Component Date Value Ref Range Status   Sodium 04/11/2022 139  135 - 145 mmol/L Final   Potassium 04/11/2022 4.1  3.5 - 5.1 mmol/L Final   Chloride 04/11/2022 101  98 - 111 mmol/L Final   CO2 04/11/2022 30  22 - 32 mmol/L Final   Glucose, Bld 04/11/2022 96  70 - 99 mg/dL Final   Glucose reference range applies only to samples taken after fasting for at least 8 hours.   BUN 04/11/2022 18  6 - 20 mg/dL Final   Creatinine, Ser 04/11/2022 1.44 (H)  0.61 - 1.24 mg/dL Final   Calcium 78/29/5621 9.7  8.9 - 10.3 mg/dL Final   GFR, Estimated 04/11/2022 >60  >60 mL/min Final   Comment: (NOTE) Calculated using the CKD-EPI Creatinine Equation (2021)    Anion gap 04/11/2022 8  5 - 15 Final   Performed at [facility], [address], [place], [place] [address]   Valproic Acid Lvl 04/13/2022 48 (L)  50.0 - 100.0 ug/mL Final   Performed at [facility], [address] [place], [place] [address]    Allergies: Risperidone and related, Milk (cow), Pork-derived products, and Risperdal [risperidone]  Medications:  Facility Ordered Medications  Medication   acetaminophen (TYLENOL) tablet 650 mg   alum & mag hydroxide-simeth (MAALOX/MYLANTA) 200-200-20 MG/5ML suspension 30 mL   magnesium hydroxide (MILK OF MAGNESIA) suspension 30 mL   hydrOXYzine (ATARAX) tablet 25 mg   traZODone (DESYREL) tablet 50 mg   PTA Medications  Medication Sig   nicotine (NICODERM CQ - DOSED IN MG/24 HOURS) 14 mg/24hr patch Place 1 patch (14 mg total) onto the skin daily.   traZODone (DESYREL) 150 MG tablet Take 1 tablet (150 mg total)  by mouth at bedtime as needed for sleep.   hydrOXYzine (ATARAX) 25 MG tablet Take 1 tablet (25 mg total) by mouth 3 (three) times daily as needed for anxiety.   LORazepam (ATIVAN) 2 MG tablet Take 1 tablet (2 mg total) by mouth every 8 (eight) hours as needed for anxiety. To be taken if patient has not slept in 24-48 hours   OXcarbazepine (TRILEPTAL) 150 MG tablet Take 1  tablet (150 mg total) by mouth 2 (two) times daily.   lurasidone (LATUDA) 20 MG TABS tablet Take 1 tablet (20 mg total) by mouth daily with supper for 3 days, THEN 2 tablets (40 mg total) daily with supper for 3 days, THEN 3 tablets (60 mg total) daily with supper for 3 days, THEN 4 tablets (80 mg total) daily with supper for 20 days.      Medical Decision Making  Patient presents to [facility] with manic behavior.  He is recommended for inpatient psychiatric admission and is in agreement.  He will be admitted to the continuous assessment unit while awaiting inpatient psychiatric bed availability    Recommendations  Based on my evaluation the patient does not appear to have an emergency medical condition.  Patient is recommended for inpatient psychiatric admission.  Admit patient to the continuous assessment unit while awaiting inpatient psychiatric bed availability.  Medications: Ativan 1 mg p.o. given now once-agitation Zyprexa 5 mg twice daily p.o. or IM-patient request that medication be given IM due to not  liking how the pill form taste in his mouth Depakote 250 mg twice daily Trazodone 50 mg nightly as needed  Lab Orders         SARS Coronavirus 2 by RT PCR (hospital order, performed in [facility] hospital lab) *cepheid single result test* Anterior Nasal Swab   -ordered at the request of social worker due to patient being under review at [facility]      CBC with Differential/Platelet         Comprehensive metabolic panel         Hemoglobin A1c         Magnesium         Ethanol         Lipid panel          TSH         POCT Urine Drug Screen - (I-Screen)      EKG  Ardis Hughs, NP 02/14/23  12:05 PM

## 2023-02-14 NOTE — ED Notes (Signed)
Patient was given atarax for aniexty 

## 2023-02-14 NOTE — ED Notes (Signed)
Rn spoke with Nathan Holloway Case manger at accepting hospital and states that patient can arrive any time. There is no cut off.

## 2023-02-14 NOTE — BH Assessment (Signed)
Comprehensive Clinical Assessment (CCA) Note  02/14/2023 Nathan Holloway [id number]  Disposition: Per Vernard Gambles, NP, patient is recommended for inpatient treatment.  The patient demonstrates the following risk factors for suicide: Chronic risk factors for suicide include: psychiatric disorder of schizoaffective disorder, bipolar type . Acute risk factors for suicide include: N/A. Protective factors for this patient include: positive social support, responsibility to others (children, family), hope for the future, religious beliefs against suicide, and life satisfaction. Considering these factors, the overall suicide risk at this point appears to be low. Patient is not appropriate for outpatient follow up.  Nathan Holloway is a 27 year old male presenting to [facility] voluntarily with chief complaint of having a "manic episode". Patient reports he caught himself "racing around and talking more than usual". Patient states "a lot happen when I don't take my medications and reports that he has not had his medications in the past few months. Patient medications include Zyprexa, Depakote and hydroxyzine.  Patient reports he stopped taking his medications because it stopped him from being himself and affecting his ability to socialize. Patient reports he was trying to take supplements to help with his mental health and for awhile he was doing good. Patient reports he was taking melatonin to sleep, some other type of supplement that had iron in it because his family has a history of iron deficiency, and he reports taking hydroxyzine whenever he felt anxious. Patient reports he was at work and his coworkers kept asking him was everything ok with him and that is when he noticed he needed to get some help. Patient reports during his last episode he lost his job and he does not want to lose this job because he likes working for [employer].   Patient does not have outpatient services and reports diagnosis of  schizoaffective, bipolar type. Patient has been psychiatrically hospitalized several times but his last admission was in August, 2023. Patient is having symptoms of pressured tangential speech, flight of ideas, grandiosity, hyper religious, difficulty sleeping and euphoric feelings. Patient denies alcohol and drug use. Patient denies legal issues and he works full time for a [employer]. Patient lives with his family in a house that his grandfather left them.   Patient recalls past "episodes" that were traumatic for him. Patient reports this time he wanted to come in voluntarily so he can go back to work and return back to his life.   Patient is oriented x4, engaged, alert and cooperative during assessment. Patient eye contact is normal, his speech is pressured and tangential, he presents manic but pleasant. Patient denies SI, HI, AVH, alcohol/drug use and paranoid thoughts.   Chief Complaint:  Chief Complaint  Patient presents with   Mental Health Evaluation   Visit Diagnosis:  Schizoaffective disorder, bipolar type (HCC)  Manic behavior (HCC)      CCA Screening, Triage and Referral (STR)  Patient Reported Information How did you hear about [place]? Family/Friend  What Is the Reason for Your Visit/Call Today? Pt presenting to [facility] voluntarily with his mother due to having an "manic episode". Pt has not been on medications for the past few months. Pt denies SI, HI, AVH alcohol and drug use.  How Long Has This Been Causing You Problems? 1 wk - 1 month  What Do You Feel Would Help You the Most Today? Treatment for Depression or other mood problem; Medication(s)   Have You Recently Had Any Thoughts About Hurting Yourself? No  Are You Planning to Commit Suicide/Harm Yourself At This  time? No   Flowsheet Row ED from 02/14/2023 in [facility]or from 05/17/2022 in [facility] Visit from [facility]/03/2022 [facility]  C-SSRS RISK CATEGORY No Risk No Risk No Risk       Have you Recently Had Thoughts About Hurting Someone [name]? No  Are You Planning to Harm Someone at This Time? No  Explanation: NA   Have You Used Any Alcohol or Drugs in the Past 24 Hours? No  What Did You Use and How Much? NA   Do You Currently Have a Therapist/Psychiatrist? No  Name of Therapist/Psychiatrist: Name of Therapist/Psychiatrist: NA   Have You Been Recently Discharged From Any Office Practice or Programs? No  Explanation of Discharge From Practice/Program: NA     CCA Screening Triage Referral Assessment Type of Contact: Face-to-Face  Telemedicine Service Delivery:   Is this Initial or Reassessment?   Date Telepsych consult ordered in [facility]:    Time Telepsych consult ordered in [facility]:    Location of Assessment: [facility]  Provider Location: [facility]   Collateral Involvement: MOTHER/GUARDIAN   Does Patient Have a Automotive engineer Guardian? Yes Mother  Legal Guardian Contact Information: SEE CHART  Copy of Legal Guardianship Form: Yes  Legal Guardian Notified of Arrival: Successfully notified  Legal Guardian Notified of Pending Discharge: Successfully notified  If Minor and Not Living with Parent(s), Who has Custody? NA  Is CPS involved or ever been involved? Never  Is APS involved or ever been involved? Never   Patient Determined To Be At Risk for Harm To Self or Others Based on Review of Patient Reported Information or Presenting Complaint? No  Method: No Plan  Availability of Means: No access or NA  Intent: Vague intent or NA  Notification Required: No need or identified person  Additional Information for Danger to Others Potential: -- (NA)  Additional Comments for Danger to Others Potential: NA  Are There Guns or Other Weapons in Your Home? No  Types of Guns/Weapons: NA  Are These  Weapons Safely Secured?                            -- (NA)  Who Could Verify You Are Able To Have These Secured: MOTHER  Do You Have any Outstanding Charges, Pending Court Dates, Parole/Probation? DENIES  Contacted To Inform of Risk of Harm To Self or Others: Family/Significant Other:    Does Patient Present under Involuntary Commitment? No    [place] of Residence: [place]   Patient Currently Receiving the Following Services: Not Receiving Services   Determination of Need: Urgent (48 hours)   Options For Referral: Medication Management; Outpatient Therapy; Inpatient Hospitalization     CCA Biopsychosocial Patient Reported Schizophrenia/Schizoaffective Diagnosis in Past: Yes   Strengths: INSIGHTFUL   Mental Health Symptoms Depression:   None (hypersomnia)   Duration of Depressive symptoms:    Mania:   Racing thoughts; Change in energy/activity; Increased Energy; Overconfidence; Recklessness; Euphoria; Irritability ("only during a mania episode")   Anxiety:    Restlessness; Worrying   Psychosis:   None   Duration of Psychotic symptoms:    Trauma:   Re-experience of traumatic event (flashbacks at times about things that didn't happen)   Obsessions:   Recurrent & persistent thoughts/impulses/images (religious preoccupation during a "mania" state)   Compulsions:   "Driven" to perform behaviors/acts  Inattention:   None   Hyperactivity/Impulsivity:   None   Oppositional/Defiant Behaviors:   None   Emotional Irregularity:   None   Other Mood/Personality Symptoms:  No data recorded   Mental Status Exam Appearance and self-care  Stature:   Average   Weight:   Average weight   Clothing:   Neat/clean   Grooming:   Normal   Cosmetic use:   None   Posture/gait:   Normal   Motor activity:   Not Remarkable   Sensorium  Attention:   Normal   Concentration:   Normal   Orientation:   X5   Recall/memory:   Normal   Affect and  Mood  Affect:   Full Range   Mood:   Hypomania (pleasant)   Relating  Eye contact:   Normal   Facial expression:   Responsive; Anxious   Attitude toward examiner:   Cooperative   Thought and Language  Speech flow:  Flight of Ideas; Pressured   Thought content:   Appropriate to Mood and Circumstances   Preoccupation:   Religion   Hallucinations:   None   Organization:   Insurance underwriter of Knowledge:   Good   Intelligence:   Average   Abstraction:   Normal   Judgement:   Fair   Dance movement psychotherapist:   Realistic   Insight:   Gaps   Decision Making:   Vacilates   Social Functioning  Social Maturity:   Responsible (history of impulsivity)   Social Judgement:   Normal   Stress  Stressors:   Grief/losses; Family conflict (recent loss of family pet; current financial hardship due to medical leave)   Coping Ability:   Overwhelmed   Skill Deficits:   None   Supports:   Family; Friends/Service system     Religion: Religion/Spirituality Are You A Religious Person?: Yes What is Your Religious Affiliation?: Chiropodist: Leisure / Recreation Do You Have Hobbies?: Yes  Exercise/Diet: Exercise/Diet Do You Exercise?: Yes Have You Gained or Lost A Significant Amount of Weight in the Past Six Months?: Yes-Gained Do You Follow a Special Diet?: No Do You Have Any Trouble Sleeping?: Yes (pt reports current hypersomnia)   CCA Employment/Education Employment/Work Situation: Employment / Work Situation Employment Situation: Employed Work Stressors: NONE REPORTED Patient's Job has Been Impacted by Current Illness: No Has Patient ever Been in Equities trader?: No  Education: Education Is Patient Currently Attending School?: No Last Grade Completed: 12 Did You Product manager?: Yes Did You Have An Individualized Education Program (IIEP): No Did You Have Any Difficulty At [employer]?: No   CCA  Family/Childhood History Family and Relationship History: Family history Marital status: Single Does patient have children?: No  Childhood History:  Childhood History By whom was/is the patient raised?: Mother Did patient suffer any verbal/emotional/physical/sexual abuse as a child?: No Did patient suffer from severe childhood neglect?: No Has patient ever been sexually abused/assaulted/raped as an adolescent or adult?: No Was the patient ever a victim of a crime or a disaster?: No Witnessed domestic violence?: No Has patient been affected by domestic violence as an adult?: No       CCA Substance Use Alcohol/Drug Use: Alcohol / Drug Use Pain Medications: SEE MAR Prescriptions: SEE MAR Over the Counter: SEE MAR History of alcohol / drug use?: No history of alcohol / drug abuse Longest period of sobriety (when/how long): 1 year Negative Consequences of Use: Work / School Withdrawal Symptoms: None  ASAM's:  Six Dimensions of Multidimensional Assessment  Dimension 1:  Acute Intoxication and/or Withdrawal Potential:   Dimension 1:  Description of individual's past and current experiences of substance use and withdrawal: SOCIAL DRINK, HISTORY OF THC USE  Dimension 2:  Biomedical Conditions and Complications:      Dimension 3:  Emotional, Behavioral, or Cognitive Conditions and Complications:     Dimension 4:  Readiness to Change:     Dimension 5:  Relapse, Continued use, or Continued Problem Potential:     Dimension 6:  Recovery/Living Environment:     ASAM Severity Score: ASAM's Severity Rating Score: 0  ASAM Recommended Level of Treatment: ASAM Recommended Level of Treatment: Level I Outpatient Treatment   Substance use Disorder (SUD) Substance Use Disorder (SUD)  Checklist Symptoms of Substance Use:  (NONE)  Recommendations for Services/Supports/Treatments: Recommendations for Services/Supports/Treatments Recommendations For  Services/Supports/Treatments: Medication Management, Individual Therapy  Discharge Disposition: Discharge Disposition Medical Exam completed: Yes Disposition of Patient: Admit  DSM5 Diagnoses: Patient Active Problem List   Diagnosis Date Noted   Anxiety state 04/09/2022   Insomnia 04/09/2022   Hypertension 08/11/2020   High risk medication use 08/11/2020   Nicotine abuse 08/11/2020   Schizoaffective disorder, bipolar type (HCC) 07/11/2020     Referrals to Alternative Service(s): Referred to Alternative Service(s):   Place:   Date:   Time:    Referred to Alternative Service(s):   Place:   Date:   Time:    Referred to Alternative Service(s):   Place:   Date:   Time:    Referred to Alternative Service(s):   Place:   Date:   Time:     Audree Camel, [facility]

## 2023-02-14 NOTE — ED Notes (Signed)
Writer spoke with safe transport regarding patient's ride to [facility]. [employer] will call back between 8-8:30 pm with the confirmation of pick time.

## 2023-02-14 NOTE — ED Notes (Signed)
Margena rn gave patient ativan but scan it under my name.

## 2023-02-14 NOTE — ED Notes (Signed)
Pt sleeping@this time. Breathing even and unlabored. Will continue to monitor for safety 

## 2023-02-14 NOTE — ED Notes (Signed)
Patient is at  nurses  talking hyperreligious.

## 2023-02-15 DIAGNOSIS — F3010 Manic episode without psychotic symptoms, unspecified: Secondary | ICD-10-CM | POA: Diagnosis not present

## 2023-02-15 DIAGNOSIS — F250 Schizoaffective disorder, bipolar type: Secondary | ICD-10-CM | POA: Diagnosis not present

## 2023-02-15 LAB — HEMOGLOBIN A1C
Hgb A1c MFr Bld: 4.7 % — ABNORMAL LOW (ref 4.8–5.6)
Mean Plasma Glucose: 88 mg/dL

## 2023-02-15 MED ORDER — TRAZODONE HCL 50 MG PO TABS
50.0000 mg | ORAL_TABLET | Freq: Once | ORAL | Status: AC
  Administered 2023-02-15: 50 mg via ORAL
  Filled 2023-02-15: qty 1

## 2023-02-15 NOTE — ED Provider Notes (Signed)
FBC/OBS ASAP Discharge Summary  Date and Time: 02/15/2023 11:10 AM  Name: Nathan Holloway  MRN:  [id number]   Discharge Diagnoses:  Final diagnoses:  Schizoaffective disorder, bipolar type (HCC)  Manic behavior (HCC)    Subjective: Patient states "I am ready to go to the other hospital."  Patient is reassessed by this nurse practitioner face-to-face.  Chart reviewed on 02/15/2023 and patient discussed with Dr. Nelly Rout.  He is standing in continuous observation unit upon my approach.  He is alert and oriented, pleasant cooperative during assessment.  He presents with anxious mood, congruent affect.  Patient states "I do not think that I am ready to go home I would like to go to the hospital."  Patient continues to deny suicidal and homicidal ideations.  He denies auditory and visual hallucinations.  Nathan Holloway engaging appropriately with staff and peers.  He endorses average sleep appetite while at [facility] behavioral health.  Patient offered support and encouragement.  He verbalizes understanding of treatment plan.  Stay Summary: HPI: patient presented to [facility] as a walk in accompanied by his mother with complaints of "I think I am having a manic episode".   Nathan Holloway, 27 y.o., male patient seen face to face by this provider and chart reviewed on 02/14/23.  Per chart review patient has a past psychiatric history of schizoaffective disorder bipolar type.  Patient has had multiple inpatient psychiatric admissions, with his last admission being 04/08/2022 at [facility].  Patient had services in place with Dr. Gillermina Phy for medication management and was prescribed Zyprexa 15 mg nightly, Depakote 250 mg twice daily, and trazodone 150 mg nightly.  Patient has not taken medications in several months, is unable to state how long.  He lives in the home with his mother.  He denies any substance use.   On evaluation Nathan Holloway reports he is currently employed but states his employer  told him to take the week off to go take care of his mental health.   During evaluation Nathan Holloway is observed sitting in the assessment room in no acute distress.  He is fairly groomed and makes fleeting eye contact.  He is alert/oriented x 4 and cooperative.  He is hyperverbal and spends gentle.  His speech is clear and coherent but at a fast rate.  He has flight of ideas and is grandiose at times.  He is hyperreligious.  He is easily distracted and has to be redirected in conversation.  He is hyperactive.  He appears manic but is pleasant.  He denies any depressive symptoms but this triggered thoughts of when his grandfather died whom he was very close to.  He is unable to state when he has slept last.  Reports a weight loss due to no appetite.  He denies SI/HI/AVH.  He is paranoid.  He does not appear to be responding to internal/external stimuli.   Discussed inpatient psychiatric admission with patient and he is in agreement.  Reports all he wants is to be able to get out of the psychiatric hospital by Sunday so he can go to church.  He remains voluntary at this time.   Nathan Holloway mother [phone].  Mother is extremely involved in patient's care.  She presents with patient to [facility] but does not go into the assessment room as to give patient his privacy.  Mother states that patient has been doing well for quite some time but he has not been taking his medications and when he does  not take his medications he has "episodes".  States patient has a special cocktail of medications that he takes that works perfectly for him.  This regimen is Zyprexa 15 mg nightly, Depakote 250 mg twice daily, and trazodone 150 mg nightly.    Total Time spent with patient: 20 minutes  Past Psychiatric History: See above Past Medical History: Hypertension, insomnia Family History: None reported Family Psychiatric History: None reported Social History: Resides with family, denies substance use Tobacco Cessation:   N/A, patient does not currently use tobacco products  Current Medications:  Current Facility-Administered Medications  Medication Dose Route Frequency Provider Last Rate Last Admin  . acetaminophen (TYLENOL) tablet 650 mg  650 mg Oral Q6H PRN Ardis Hughs, NP      . alum & mag hydroxide-simeth (MAALOX/MYLANTA) 200-200-20 MG/5ML suspension 30 mL  30 mL Oral Q4H PRN Ardis Hughs, NP      . divalproex (DEPAKOTE) DR tablet 250 mg  250 mg Oral BID Ardis Hughs, NP   250 mg at 02/15/23 0919  . hydrOXYzine (ATARAX) tablet 25 mg  25 mg Oral TID PRN Ardis Hughs, NP   25 mg at 02/15/23 0055  . magnesium hydroxide (MILK OF MAGNESIA) suspension 30 mL  30 mL Oral Daily PRN Ardis Hughs, NP      . OLANZapine zydis (ZYPREXA) disintegrating tablet 5 mg  5 mg Oral BID Ardis Hughs, NP   5 mg at 02/15/23 0919   Or  . OLANZapine (ZYPREXA) injection 5 mg  5 mg Intramuscular BID Ardis Hughs, NP   5 mg at 02/14/23 1519  . OLANZapine zydis (ZYPREXA) disintegrating tablet 5 mg  5 mg Oral NOW Ardis Hughs, NP      . traZODone (DESYREL) tablet 50 mg  50 mg Oral QHS PRN Ardis Hughs, NP   50 mg at 02/14/23 2124   Current Outpatient Medications  Medication Sig Dispense Refill  . hydrOXYzine (ATARAX) 25 MG tablet Take 1 tablet (25 mg total) by mouth 3 (three) times daily as needed for anxiety. 30 tablet 0  . MELATONIN GUMMIES PO Take 1 tablet by mouth at bedtime.    Marland Kitchen lurasidone (LATUDA) 20 MG TABS tablet Take 1 tablet (20 mg total) by mouth daily with supper for 3 days, THEN 2 tablets (40 mg total) daily with supper for 3 days, THEN 3 tablets (60 mg total) daily with supper for 3 days, THEN 4 tablets (80 mg total) daily with supper for 20 days. (Patient not taking: Reported on 02/14/2023) 90 tablet 0  . OXcarbazepine (TRILEPTAL) 150 MG tablet Take 1 tablet (150 mg total) by mouth 2 (two) times daily. (Patient not taking: Reported on 02/14/2023) 60 tablet 0  . traZODone  (DESYREL) 150 MG tablet Take 1 tablet (150 mg total) by mouth at bedtime as needed for sleep. (Patient not taking: Reported on 02/14/2023) 30 tablet 0    PTA Medications:  PTA Medications  Medication Sig  . hydrOXYzine (ATARAX) 25 MG tablet Take 1 tablet (25 mg total) by mouth 3 (three) times daily as needed for anxiety.  . traZODone (DESYREL) 150 MG tablet Take 1 tablet (150 mg total) by mouth at bedtime as needed for sleep. (Patient not taking: Reported on 02/14/2023)  . OXcarbazepine (TRILEPTAL) 150 MG tablet Take 1 tablet (150 mg total) by mouth 2 (two) times daily. (Patient not taking: Reported on 02/14/2023)  . lurasidone (LATUDA) 20 MG TABS tablet Take 1 tablet (20 mg  total) by mouth daily with supper for 3 days, THEN 2 tablets (40 mg total) daily with supper for 3 days, THEN 3 tablets (60 mg total) daily with supper for 3 days, THEN 4 tablets (80 mg total) daily with supper for 20 days. (Patient not taking: Reported on 02/14/2023)   Facility Ordered Medications  Medication  . acetaminophen (TYLENOL) tablet 650 mg  . alum & mag hydroxide-simeth (MAALOX/MYLANTA) 200-200-20 MG/5ML suspension 30 mL  . magnesium hydroxide (MILK OF MAGNESIA) suspension 30 mL  . hydrOXYzine (ATARAX) tablet 25 mg  . traZODone (DESYREL) tablet 50 mg  . OLANZapine zydis (ZYPREXA) disintegrating tablet 5 mg  . [COMPLETED] LORazepam (ATIVAN) tablet 1 mg  . divalproex (DEPAKOTE) DR tablet 250 mg  . OLANZapine zydis (ZYPREXA) disintegrating tablet 5 mg   Or  . OLANZapine (ZYPREXA) injection 5 mg  . [COMPLETED] traZODone (DESYREL) tablet 50 mg       05/17/2022    1:00 PM 05/03/2022    3:41 PM 04/07/2022    7:28 PM  Depression screen PHQ 2/9  Decreased Interest 1 1 0  Down, Depressed, Hopeless 0 0 2  PHQ - 2 Score 1 1 2   Altered sleeping 3  2  Tired, decreased energy 3  0  Change in appetite 3  2  Feeling bad or failure about yourself  1  0  Trouble concentrating 1  1  Moving slowly or fidgety/restless 0  2   Suicidal thoughts 0  0  PHQ-9 Score 12  9  Difficult doing work/chores Somewhat difficult  Very difficult    Flowsheet Row ED from 02/14/2023 in [facility] Counselor from 05/17/2022 in [facility] Visit from [facility]/03/2022 [facility]  C-SSRS RISK CATEGORY No Risk No Risk No Risk       Musculoskeletal  Strength & Muscle Tone: within normal limits Gait & Station: normal Patient leans: N/A  Psychiatric Specialty Exam  Presentation  General Appearance:  Casual; Appropriate for Environment  Eye Contact: Good  Speech: Clear and Coherent; Normal Rate  Speech Volume: Normal  Handedness: Right   Mood and Affect  Mood: Anxious  Affect: Congruent   Thought Process  Thought Processes: Coherent; Goal Directed  Descriptions of Associations:Intact  Orientation:Full (Time, Place and Person)  Thought Content:Logical  Diagnosis of Schizophrenia or Schizoaffective disorder in past: Yes    Hallucinations:Hallucinations: None  Ideas of Reference:None  Suicidal Thoughts:Suicidal Thoughts: No  Homicidal Thoughts:Homicidal Thoughts: No   Sensorium  Memory: Immediate Good  Judgment: Fair  Insight: Present   Executive Functions  Concentration: Good  Attention Span: Good  Recall: Good  Fund of Knowledge: Good  Language: Good   Psychomotor Activity  Psychomotor Activity: Psychomotor Activity: Restlessness   Assets  Assets: Communication Skills; Desire for Improvement; Financial Resources/Insurance; Housing   Sleep  Sleep: Sleep: Fair   No data recorded  Physical Exam  Physical Exam Vitals and nursing note reviewed.  Constitutional:      Appearance: Normal appearance. He is well-developed.  HENT:     Head: Normocephalic.     Nose: Nose normal.  Cardiovascular:     Rate and Rhythm: Normal rate.  Pulmonary:      Effort: Pulmonary effort is normal.  Musculoskeletal:        General: Normal range of motion.     Cervical back: Normal range of motion.  Skin:    General: Skin is warm and dry.  Neurological:     Mental Status: He is alert and oriented to person, place, and time.  Psychiatric:        Attention and Perception: Attention and perception normal.        Mood and Affect: Affect normal. Mood is anxious.        Speech: Speech normal.        Behavior: Behavior normal. Behavior is cooperative.        Thought Content: Thought content normal.        Cognition and Memory: Cognition and memory normal.   Review of Systems  Constitutional: Negative.   HENT: Negative.    Eyes: Negative.   Respiratory: Negative.    Cardiovascular: Negative.   Gastrointestinal: Negative.   Genitourinary: Negative.   Musculoskeletal: Negative.   Skin: Negative.   Neurological: Negative.   Psychiatric/Behavioral:  The patient is nervous/anxious.    Blood pressure 121/81, pulse 67, temperature 98.6 F (37 C), temperature source Oral, resp. rate 19, height 5\' 8"  (1.727 m), weight 173 lb (78.5 kg), SpO2 100 %. Body mass index is 26.3 kg/m.  Demographic Factors:  Male and Unemployed  Loss Factors: NA  Historical Factors: NA  Risk Reduction Factors:   Sense of responsibility to family, Living with another person, especially a relative, Positive social support, Positive therapeutic relationship, and Positive coping skills or problem solving skills  Continued Clinical Symptoms:  Previous Psychiatric Diagnoses and Treatments  Cognitive Features That Contribute To Risk:  None    Suicide Risk:  Minimal: No identifiable suicidal ideation.  Patients presenting with no risk factors but with morbid ruminations; may be classified as minimal risk based on the severity of the depressive symptoms  Plan Of Care/Follow-up recommendations:  Patient remains voluntary.  Inpatient psychiatric treatment recommended.   Patient accepted to [facility] by Dr. Robet Leu for rhizolysis 02/15/2023.  Disposition: Discharge to [facility] for inpatient psychiatric treatment  Lenard Lance, FNP 02/15/2023, 11:10 AM

## 2023-02-15 NOTE — ED Notes (Signed)
Pt sleeping@this time. Breathing even and unlabored. Will continue to monitor for safety 

## 2023-02-22 ENCOUNTER — Encounter (HOSPITAL_COMMUNITY): Payer: Self-pay | Admitting: Behavioral Health

## 2023-02-22 ENCOUNTER — Ambulatory Visit (HOSPITAL_COMMUNITY)
Admission: EM | Admit: 2023-02-22 | Discharge: 2023-02-23 | Disposition: A | Discharge status: Other Acute Inpatient Hospital | Payer: BLUE CROSS/BLUE SHIELD | Attending: Behavioral Health | Admitting: Behavioral Health

## 2023-02-22 DIAGNOSIS — F600 Paranoid personality disorder: Secondary | ICD-10-CM | POA: Diagnosis not present

## 2023-02-22 DIAGNOSIS — F250 Schizoaffective disorder, bipolar type: Secondary | ICD-10-CM | POA: Diagnosis present

## 2023-02-22 DIAGNOSIS — F22 Delusional disorders: Secondary | ICD-10-CM

## 2023-02-22 LAB — COMPREHENSIVE METABOLIC PANEL
ALT: 15 U/L (ref 0–44)
AST: 20 U/L (ref 15–41)
Albumin: 4.1 g/dL (ref 3.5–5.0)
Alkaline Phosphatase: 58 U/L (ref 38–126)
Anion gap: 9 (ref 5–15)
BUN: 8 mg/dL (ref 6–20)
CO2: 29 mmol/L (ref 22–32)
Calcium: 9.7 mg/dL (ref 8.9–10.3)
Chloride: 101 mmol/L (ref 98–111)
Creatinine, Ser: 1.3 mg/dL — ABNORMAL HIGH (ref 0.61–1.24)
GFR, Estimated: >60 mL/min (ref >60)
Glucose, Bld: 136 mg/dL — ABNORMAL HIGH (ref 70–99)
Potassium: 4.7 mmol/L (ref 3.5–5.1)
Sodium: 139 mmol/L (ref 135–145)
Total Bilirubin: 0.8 mg/dL (ref 0.3–1.2)
Total Protein: 7 g/dL (ref 6.5–8.1)

## 2023-02-22 LAB — CBC WITH DIFFERENTIAL/PLATELET
Abs Immature Granulocytes: 0.02 10*3/uL (ref 0.00–0.07)
Basophils Absolute: 0 10*3/uL (ref 0.0–0.1)
Basophils Relative: 0 %
Eosinophils Absolute: 0 10*3/uL (ref 0.0–0.5)
Eosinophils Relative: 0 %
HCT: 46 % (ref 39.0–52.0)
Hemoglobin: 14.7 g/dL (ref 13.0–17.0)
Immature Granulocytes: 0 %
Lymphocytes Relative: 27 %
Lymphs Abs: 2.4 10*3/uL (ref 0.7–4.0)
MCH: 27.7 pg (ref 26.0–34.0)
MCHC: 32 g/dL (ref 30.0–36.0)
MCV: 86.8 fL (ref 80.0–100.0)
Monocytes Absolute: 0.4 10*3/uL (ref 0.1–1.0)
Monocytes Relative: 5 %
Neutro Abs: 6.3 10*3/uL (ref 1.7–7.7)
Neutrophils Relative %: 68 %
Platelets: 235 10*3/uL (ref 150–400)
RBC: 5.3 MIL/uL (ref 4.22–5.81)
RDW: 12 % (ref 11.5–15.5)
WBC: 9.2 10*3/uL (ref 4.0–10.5)
nRBC: 0 % (ref 0.0–0.2)

## 2023-02-22 LAB — POCT URINE DRUG SCREEN - MANUAL ENTRY (I-SCREEN)
POC Amphetamine UR: NOT DETECTED
POC Buprenorphine (BUP): NOT DETECTED
POC Cocaine UR: NOT DETECTED
POC Marijuana UR: NOT DETECTED
POC Methadone UR: NOT DETECTED
POC Methamphetamine UR: NOT DETECTED
POC Morphine: NOT DETECTED
POC Oxazepam (BZO): NOT DETECTED
POC Oxycodone UR: NOT DETECTED
POC Secobarbital (BAR): NOT DETECTED

## 2023-02-22 LAB — LIPID PANEL
Cholesterol: 189 mg/dL (ref 0–200)
HDL: 66 mg/dL (ref >40)
LDL Cholesterol: 96 mg/dL (ref 0–99)
Total CHOL/HDL Ratio: 2.9 RATIO
Triglycerides: 134 mg/dL (ref <150)
VLDL: 27 mg/dL (ref 0–40)

## 2023-02-22 LAB — HEMOGLOBIN A1C
Hgb A1c MFr Bld: 4.2 % — ABNORMAL LOW (ref 4.8–5.6)
Mean Plasma Glucose: 73.84 mg/dL

## 2023-02-22 LAB — MAGNESIUM: Magnesium: 2 mg/dL (ref 1.7–2.4)

## 2023-02-22 LAB — VALPROIC ACID LEVEL: Valproic Acid Lvl: 17 ug/mL — ABNORMAL LOW (ref 50.0–100.0)

## 2023-02-22 LAB — ETHANOL: Alcohol, Ethyl (B): <10 mg/dL (ref <10)

## 2023-02-22 LAB — TSH: TSH: 1.855 u[IU]/mL (ref 0.350–4.500)

## 2023-02-22 MED ORDER — TRAZODONE HCL 50 MG PO TABS: 50.0000 mg | ORAL_TABLET | Freq: Every evening | ORAL | Status: DC | PRN

## 2023-02-22 MED ORDER — MAGNESIUM HYDROXIDE 400 MG/5ML PO SUSP: 30.0000 mL | Freq: Every day | ORAL | Status: DC | PRN

## 2023-02-22 MED ORDER — ALUM & MAG HYDROXIDE-SIMETH 200-200-20 MG/5ML PO SUSP: 30.0000 mL | ORAL | Status: DC | PRN

## 2023-02-22 MED ORDER — OLANZAPINE 5 MG PO TBDP
5.0000 mg | ORAL_TABLET | Freq: Two times a day (BID) | ORAL | Status: DC
  Administered 2023-02-22: 5 mg via ORAL
  Filled 2023-02-22: qty 1

## 2023-02-22 MED ORDER — ACETAMINOPHEN 325 MG PO TABS: 650.0000 mg | ORAL_TABLET | Freq: Four times a day (QID) | ORAL | Status: DC | PRN

## 2023-02-22 MED ORDER — DIVALPROEX SODIUM 250 MG PO DR TAB
250.0000 mg | DELAYED_RELEASE_TABLET | Freq: Two times a day (BID) | ORAL | Status: DC
  Administered 2023-02-22: 250 mg via ORAL
  Filled 2023-02-22: qty 1

## 2023-02-22 NOTE — ED Provider Notes (Signed)
[facility] Continuous Assessment Admission H&P  Date: 02/22/23 Patient Name: Nathan Holloway MRN: [id number] Chief Complaint: "I felt something was weird"  Diagnoses:  Final diagnoses:  Paranoid ideation (HCC)  Schizoaffective disorder, bipolar type [facility])    HPI: Nathan Holloway is a 27 y.o. male patient with a past psychiatric history of schizoaffective disorder, bipolar type, manic and psychotic behaviors, and marijuana abuse who presented to [facility] voluntarily and unaccompanied via EMS with complaints of feeling weird.   Patient assessed face-to-face by this provider and chart reviewed on 02/22/23. Nathan Holloway, [facility] present during assessment. On evaluation, Nathan Holloway is seated in assessment area in no acute distress. Patient is alert and oriented x4, calm, cooperative, and pleasant. Speech is clear and coherent, normal rate and volume. Eye contact is good. Mood is euthymic with congruent affect. Thought process is linear with tangential thought content that consists of paranoid ideation, delusions, and hyperreligiosity. Patient states he was recently discharged from [facility] and "as soon as I got back I noticed something with my mom and grandma was off." Patient states he tried to take a shower today and noticed the skin on his forehead was "weird and peeling." Dry skin noted to patient's forehead during assessment. Patient states his mother insisted he take his medications but that he wanted to take them by himself because he felt they were different colors and not how they looked in the hospital, "I didn't trust it." Patient states he thinks he's been given his medications too frequently. Patient states he contacted EMS today because he does not feel safe in his home and thinks "voodoo" is happening there. Patient states when EMS arrived, "I made sure I recorded everything." Patient denies suicidal and homicidal ideations and easily contracts verbally for safety. Patient denies a  history of suicide attempts or self-harm. Patient reports multiple past psychiatric hospitalizations. Patient denies auditory and visual hallucinations then reports he doesn't feel comfortable returning home because he seen an umbrella and a picture of a "scary voodoo scene" in his house today. Patient states "I don't know what's going on. I felt like something was in my arm." Patient also reports that his mother and grandmother's "tone of voice" was not right and he felt like they were yelling at him while he had headphones on today listening to music. Patient denies that he feels paranoid. Objectively, patient does not appear manic but there is evidence of paranoid ideation and delusional thinking. There is no indication that patient is currently responding to internal or external stimuli.  Patient reports good sleep (8 hours/night) and good appetite. Patient states he resides with his mother, grandmother, and uncle and denies access to weapons/firearms. Patient states he is employed at [employer] in [place]. Patient denies use of alcohol or illicit substances. Patient states he is currently prescribed olanzapine, hydroxyzine, trazodone, and valproic acid although he is unsure of exact doses. Patient states he believes he received the [place] injection while he was at [employer]. Patient states he has an outpatient psychiatry appointment scheduled for 02/27/23 but he is unsure where. Patient gave verbal consent for provider to call his mother to obtain collateral information.   Spoke with patient's mother Nathan Holloway [phone]) who states patient was discharged from [facility] on 02/20/23, which she feels was too early. Mother states she is not patient's legal guardian and that there is no guardianship paperwork in place. Mother states today was patient's second day home from [place] and he reported  he did not want to take his medications. Mother states patient started calling her a  witch and said he didn't trust her. Mother states patient was watching movies all day and then called 911 stating someone was trying to overdose him. Mother states patient's "brain is still fuzzy, he has mania and reality mixing together." Mother states patient had his first "mental breakdown" at 27 years old after his father died and was hospitalized for psychiatric reasons twice while living in [place] and 3 times since moving to [place] in 2018. Mother reports patient has a history of medication noncompliance and will stop taking his medications shortly after being discharged from the hospital. Mother states that patient's trigger is having girlfriends that he believes are cheating on him. Mother reports patient had a period of 5 years in which he was stable. Mother states during this time she believes patient was prescribed Zyprexa Zydis "10 or 15mg " and "Depakote Sprinkles, two at bedtime." Mother states patient has an outpatient psychiatric appointment with [facility] on 02/27/23. During call, mother read over discharge paperwork from [facility] and could not find if patient had actually received an [place] injection or not while there. Secure message sent to Nathan Holloway, [facility] to attempt to verify. Mother states patient was discharged from [facility] with prescriptions for olanzapine 5mg  at bedtime, Depakene 250mg /71ml, take 5ml at 8am and 3pm, trazodone 50mg  PRN at bedtime, and hydroxyzine 25mg  q12h PRN anxiety. Mother reports she gave patient hydroxyzine at 1pm and states this had the opposite affect on patient and increased his anxiety instead. Mother states she would prefer patient not to take hydroxyzine again.  Per chart review, patient had a visit at [facility] on 09/23/22 with Dr. Mercy Riding and received the following medication plan: "Plan: - Taper Zyprexa to 10 mg QHS upon starting Latuda and decrease by 5 mg every 3 days until off the medicine - Start Latuda 20 mg  w/dinner and increase by 20 mg every 3 days until a dose of 80 mg w/dinner - Start Trileptal 150 mg BID - Discontinue Depakene to 250 mg MG in the morning and 750 mg QHS, due to oversedation - Depakote level was 48 on 04/13/22 - Continue Atarax 25 mg TID prn for anxiety - Discontinue Trazodone 150 mg QHS prn for insomnia due to oversedation - Continue Ativan 2 mg QHS prn for 24-48 hour period of insomnia, patient has yet to need this - Discontinue Nicoderm  patch for nicotine dependence as patient no longer taking - Transfer to [facility] due to loss of insurance and follow up in 3-4 weeks - FMLA updated, patient to provide request for further progress note updates."  Patient offered support and encouragement. Discussed with patient increasing Zyprexa from 5mg  at bedtime to 5mg  BID. Discussed with patient taking Depakote DR 250mg  BID. Discussed with patient recommendations for inpatient psychiatric treatment for medication management and stabilization. Discussed with patient admission to the continuous observation unit for safety monitoring while awaiting inpatient psychiatric placement. Patient is in agreement with plan of care.  Total Time spent with patient: 45 minutes  Musculoskeletal  Strength & Muscle Tone: within normal limits Gait & Station: normal Patient leans: N/A  Psychiatric Specialty Exam  Presentation General Appearance:  Appropriate for Environment; Casual  Eye Contact: Good  Speech: Clear and Coherent; Normal Rate  Speech Volume: Normal  Handedness: Right   Mood and Affect  Mood: Euthymic  Affect: Congruent   Thought Process  Thought Processes: Linear  Descriptions of Associations:Tangential  Orientation:Full (Time, Place and Person)  Thought Content:Paranoid Ideation; Tangential; Delusions  Diagnosis of Schizophrenia or Schizoaffective disorder in past: Yes  Duration of Psychotic Symptoms: Greater than six months  Hallucinations:Hallucinations:  Visual Description of Visual Hallucinations: Patient denies AVH then reports he seen an umbrella and "scary voodoo scene" in his house today  Ideas of Reference:Paranoia; Delusions  Suicidal Thoughts:Suicidal Thoughts: No  Homicidal Thoughts:Homicidal Thoughts: No   Sensorium  Memory: Immediate Fair; Recent Fair; Remote Fair  Judgment: Fair  Insight: Present   Executive Functions  Concentration: Good  Attention Span: Good  Recall: Good  Fund of Knowledge: Good  Language: Good   Psychomotor Activity  Psychomotor Activity: Psychomotor Activity: Normal   Assets  Assets: Communication Skills; Desire for Improvement; Financial Resources/Insurance; Housing; Leisure Time; Physical Health; Resilience; Social Support; Transportation   Sleep  Sleep: Sleep: Good Number of Hours of Sleep: 8   Nutritional Assessment (For OBS and FBC admissions only) Has the patient had a weight loss or gain of 10 pounds or more in the last 3 months?: No Has the patient had a decrease in food intake/or appetite?: No Does the patient have dental problems?: No Does the patient have eating habits or behaviors that may be indicators of an eating disorder including binging or inducing vomiting?: No Has the patient recently lost weight without trying?: 0 Has the patient been eating poorly because of a decreased appetite?: 0 Malnutrition Screening Tool Score: 0    Physical Exam Vitals and nursing note reviewed.  Constitutional:      General: He is not in acute distress.    Appearance: Normal appearance. He is not ill-appearing.  HENT:     Head: Normocephalic and atraumatic.     Nose: Nose normal.  Eyes:     General:        Right eye: No discharge.        Left eye: No discharge.     Conjunctiva/sclera: Conjunctivae normal.  Cardiovascular:     Rate and Rhythm: Normal rate.  Pulmonary:     Effort: Pulmonary effort is normal. No respiratory distress.  Musculoskeletal:         General: Normal range of motion.     Cervical back: Normal range of motion.  Skin:    General: Skin is warm and dry.  Neurological:     General: No focal deficit present.     Mental Status: He is alert and oriented to person, place, and time.  Psychiatric:        Attention and Perception: Attention normal. He perceives visual (Patient denies AVH then reports he seen an umbrella and scary "voodoo" scene in his house today) hallucinations. He does not perceive auditory hallucinations.        Mood and Affect: Mood and affect normal.        Speech: Speech normal.        Behavior: Behavior normal. Behavior is cooperative.        Thought Content: Thought content is paranoid and delusional. Thought content does not include homicidal or suicidal ideation. Thought content does not include homicidal or suicidal plan.        Cognition and Memory: Cognition and memory normal.        Judgment: Judgment normal.    Review of Systems  Constitutional: Negative.   HENT: Negative.    Eyes: Negative.   Respiratory: Negative.    Cardiovascular: Negative.   Gastrointestinal: Negative.   Genitourinary: Negative.  Musculoskeletal: Negative.   Skin: Negative.   Neurological: Negative.   Endo/Heme/Allergies: Negative.   Psychiatric/Behavioral:  Positive for hallucinations. Negative for depression, memory loss, substance abuse and suicidal ideas. The patient is not nervous/anxious and does not have insomnia.     Blood pressure 119/78, pulse 88, temperature 98.7 F (37.1 C), temperature source Oral, resp. rate 18, height 5\' 8"  (1.727 m), weight 170 lb (77.1 kg), SpO2 100 %. Body mass index is 25.85 kg/m.  Past Psychiatric History: Schizoaffective disorder, bipolar type, manic and psychotic behaviors, and marijuana abuse  Is the patient at risk to self? Yes  Has the patient been a risk to self in the past 6 months? Yes Has the patient been a risk to self within the distant past? Yes   Is the patient a  risk to others? Yes Has the patient been a risk to others in the past 6 months? Yes Has the patient been a risk to others within the distant past? Yes  Past Medical History:  Past Medical History:  Diagnosis Date   Manic behavior (HCC)    Psychotic affective disorder (HCC) 08/30/2015   Family History:  Family History  Problem Relation Age of Onset   Healthy Mother    Social History:  Social History   Tobacco Use   Smoking status: Never   Smokeless tobacco: Never  Vaping Use   Vaping Use: Every day   Substances: Nicotine, Flavoring  Substance Use Topics   Alcohol use: No   Drug use: Not Currently    Types: Marijuana   Last Labs:  Admission on 02/22/2023  Component Date Value Ref Range Status   POC Amphetamine UR 02/22/2023 None Detected  NONE DETECTED (Cut Off Level 1000 ng/mL) Final   POC Secobarbital (BAR) 02/22/2023 None Detected  NONE DETECTED (Cut Off Level 300 ng/mL) Final   POC Buprenorphine (BUP) 02/22/2023 None Detected  NONE DETECTED (Cut Off Level 10 ng/mL) Final   POC Oxazepam (BZO) 02/22/2023 None Detected  NONE DETECTED (Cut Off Level 300 ng/mL) Final   POC Cocaine UR 02/22/2023 None Detected  NONE DETECTED (Cut Off Level 300 ng/mL) Final   POC Methamphetamine UR 02/22/2023 None Detected  NONE DETECTED (Cut Off Level 1000 ng/mL) Final   POC Morphine 02/22/2023 None Detected  NONE DETECTED (Cut Off Level 300 ng/mL) Final   POC Methadone UR 02/22/2023 None Detected  NONE DETECTED (Cut Off Level 300 ng/mL) Final   POC Oxycodone UR 02/22/2023 None Detected  NONE DETECTED (Cut Off Level 100 ng/mL) Final   POC Marijuana UR 02/22/2023 None Detected  NONE DETECTED (Cut Off Level 50 ng/mL) Final  Admission on 02/14/2023, Discharged on 02/15/2023  Component Date Value Ref Range Status   WBC 02/14/2023 8.4  4.0 - 10.5 K/uL Final   RBC 02/14/2023 5.40  4.22 - 5.81 MIL/uL Final   Hemoglobin 02/14/2023 15.0  13.0 - 17.0 g/dL Final   HCT 16/05/9603 45.3  39.0 - 52.0 %  Final   MCV 02/14/2023 83.9  80.0 - 100.0 fL Final   MCH 02/14/2023 27.8  26.0 - 34.0 pg Final   MCHC 02/14/2023 33.1  30.0 - 36.0 g/dL Final   RDW 54/04/8118 12.1  11.5 - 15.5 % Final   Platelets 02/14/2023 266  150 - 400 K/uL Final   nRBC 02/14/2023 0.0  0.0 - 0.2 % Final   Neutrophils Relative % 02/14/2023 65  % Final   Neutro Abs 02/14/2023 5.5  1.7 - 7.7 K/uL Final  Lymphocytes Relative 02/14/2023 26  % Final   Lymphs Abs 02/14/2023 2.2  0.7 - 4.0 K/uL Final   Monocytes Relative 02/14/2023 8  % Final   Monocytes Absolute 02/14/2023 0.7  0.1 - 1.0 K/uL Final   Eosinophils Relative 02/14/2023 0  % Final   Eosinophils Absolute 02/14/2023 0.0  0.0 - 0.5 K/uL Final   Basophils Relative 02/14/2023 1  % Final   Basophils Absolute 02/14/2023 0.0  0.0 - 0.1 K/uL Final   Immature Granulocytes 02/14/2023 0  % Final   Abs Immature Granulocytes 02/14/2023 0.01  0.00 - 0.07 K/uL Final   Performed at [facility], [address] [place], [place] [address]   Sodium 02/14/2023 136  135 - 145 mmol/L Final   Potassium 02/14/2023 3.7  3.5 - 5.1 mmol/L Final   Chloride 02/14/2023 101  98 - 111 mmol/L Final   CO2 02/14/2023 24  22 - 32 mmol/L Final   Glucose, Bld 02/14/2023 77  70 - 99 mg/dL Final   Glucose reference range applies only to samples taken after fasting for at least 8 hours.   BUN 02/14/2023 14  6 - 20 mg/dL Final   Creatinine, Ser 02/14/2023 1.29 (H)  0.61 - 1.24 mg/dL Final   Calcium 19/14/7829 9.8  8.9 - 10.3 mg/dL Final   Total Protein 56/21/3086 7.7  6.5 - 8.1 g/dL Final   Albumin 57/84/6962 4.6  3.5 - 5.0 g/dL Final   AST 95/28/4132 19  15 - 41 U/L Final   ALT 02/14/2023 15  0 - 44 U/L Final   Alkaline Phosphatase 02/14/2023 59  38 - 126 U/L Final   Total Bilirubin 02/14/2023 1.2  0.3 - 1.2 mg/dL Final   GFR, Estimated 02/14/2023 >60  >60 mL/min Final   Comment: (NOTE) Calculated using the CKD-EPI Creatinine Equation (2021)    Anion gap 02/14/2023 11  5 - 15 Final    Performed at [facility], [address] [place], [place] [address]   Hgb A1c MFr Bld 02/14/2023 4.7 (L)  4.8 - 5.6 % Final   Comment: (NOTE)         Prediabetes: 5.7 - 6.4         Diabetes: >6.4         Glycemic control for adults with diabetes: <7.0    Mean Plasma Glucose 02/14/2023 88  mg/dL Final   Comment: (NOTE) Performed At: [facility] [address] [place], [place] [id number] Jolene Schimke MD IH:[id number]    Magnesium 02/14/2023 1.9  1.7 - 2.4 mg/dL Final   Performed at [facility], [address], [place], [place] [address]   Alcohol, Ethyl (B) 02/14/2023 <10  <10 mg/dL Final   Comment: (NOTE) Lowest detectable limit for serum alcohol is 10 mg/dL.  For medical purposes only. Performed at [facility], [address] [place], [place] [address]    Cholesterol 02/14/2023 197  0 - 200 mg/dL Final   Triglycerides 88/41/6606 45  <150 mg/dL Final   HDL 30/16/0109 71  >40 mg/dL Final   Total CHOL/HDL Ratio 02/14/2023 2.8  RATIO Final   VLDL 02/14/2023 9  0 - 40 mg/dL Final   LDL Cholesterol 02/14/2023 117 (H)  0 - 99 mg/dL Final   Comment:        Total Cholesterol/HDL:CHD Risk Coronary Heart Disease Risk Table  Men   Women  1/2 Average Risk   3.4   3.3  Average Risk       5.0   4.4  2 X Average Risk   9.6   7.1  3 X Average Risk  23.4   11.0        Use the calculated Patient Ratio above and the CHD Risk Table to determine the patient's CHD Risk.        ATP III CLASSIFICATION (LDL):  <100     mg/dL   Optimal  469-629  mg/dL   Near or Above                    Optimal  130-159  mg/dL   Borderline  528-413  mg/dL   High  >244     mg/dL   Very High Performed at [facility], [address], [place], [place] [address]    TSH 02/14/2023 1.688  0.350 - 4.500 uIU/mL Final   Comment: Performed by a 3rd Generation assay with a functional sensitivity of <=0.01 uIU/mL. Performed at [facility], [address] [place], [place] [address]    POC Amphetamine UR 02/14/2023 None Detected  NONE DETECTED (Cut Off Level 1000 ng/mL) Final   POC Secobarbital (BAR) 02/14/2023 None Detected  NONE DETECTED (Cut Off Level 300 ng/mL) Final   POC Buprenorphine (BUP) 02/14/2023 None Detected  NONE DETECTED (Cut Off Level 10 ng/mL) Final   POC Oxazepam (BZO) 02/14/2023 None Detected  NONE DETECTED (Cut Off Level 300 ng/mL) Final   POC Cocaine UR 02/14/2023 None Detected  NONE DETECTED (Cut Off Level 300 ng/mL) Final   POC Methamphetamine UR 02/14/2023 None Detected  NONE DETECTED (Cut Off Level 1000 ng/mL) Final   POC Morphine 02/14/2023 None Detected  NONE DETECTED (Cut Off Level 300 ng/mL) Final   POC Methadone UR 02/14/2023 None Detected  NONE DETECTED (Cut Off Level 300 ng/mL) Final   POC Oxycodone UR 02/14/2023 None Detected  NONE DETECTED (Cut Off Level 100 ng/mL) Final   POC Marijuana UR 02/14/2023 None Detected  NONE DETECTED (Cut Off Level 50 ng/mL) Final   SARS Coronavirus 2 by RT PCR 02/14/2023 NEGATIVE  NEGATIVE Final   Performed at [facility], [address] [place], [place] [address]    Allergies: Risperidone and related, Milk (cow), and Pork-derived products  Medications:  Facility Ordered Medications  Medication   acetaminophen (TYLENOL) tablet 650 mg   alum & mag hydroxide-simeth (MAALOX/MYLANTA) 200-200-20 MG/5ML suspension 30 mL   magnesium hydroxide (MILK OF MAGNESIA) suspension 30 mL   traZODone (DESYREL) tablet 50 mg   OLANZapine zydis (ZYPREXA) disintegrating tablet 5 mg   divalproex (DEPAKOTE) DR tablet 250 mg   PTA Medications  Medication Sig   traZODone (DESYREL) 150 MG tablet Take 1 tablet (150 mg total) by mouth at bedtime as needed for sleep. (Patient not taking: Reported on 02/14/2023)   hydrOXYzine (ATARAX) 25 MG tablet Take 1 tablet (25 mg total) by mouth 3 (three) times daily as needed for anxiety.   OXcarbazepine (TRILEPTAL) 150 MG tablet Take 1 tablet (150 mg  total) by mouth 2 (two) times daily. (Patient not taking: Reported on 02/14/2023)   lurasidone (LATUDA) 20 MG TABS tablet Take 1 tablet (20 mg total) by mouth daily with supper for 3 days, THEN 2 tablets (40 mg total) daily with supper for 3 days, THEN 3 tablets (60 mg total) daily with supper for  3 days, THEN 4 tablets (80 mg total) daily with supper for 20 days. (Patient not taking: Reported on 02/14/2023)   MELATONIN GUMMIES PO Take 1 tablet by mouth at bedtime.      Medical Decision Making  Nathan Holloway was admitted to [facility] continuous assessment unit for Schizoaffective disorder, bipolar type [facility]), crisis management, and stabilization. Routine labs ordered, which include Lab Orders         CBC with Differential/Platelet         Comprehensive metabolic panel         Hemoglobin A1c         Magnesium         Ethanol         Lipid panel         TSH         Prolactin         Valproic acid level         POCT Urine Drug Screen - (I-Screen)    -EKG Medication Management: Medications started, continue home medications as appropriate Meds ordered this encounter  Medications   acetaminophen (TYLENOL) tablet 650 mg   alum & mag hydroxide-simeth (MAALOX/MYLANTA) 200-200-20 MG/5ML suspension 30 mL   magnesium hydroxide (MILK OF MAGNESIA) suspension 30 mL   traZODone (DESYREL) tablet 50 mg   OLANZapine zydis (ZYPREXA) disintegrating tablet 5 mg   divalproex (DEPAKOTE) DR tablet 250 mg    Will maintain observation checks every 15 minutes for safety. Psychosocial education regarding relapse prevention and self-care; social and communication  Social work will consult with family for collateral information and discuss discharge and follow up plan.   Recommendations  Based on my evaluation the patient does not appear to have an emergency medical condition.  -Check valproic acid level -Olanzapine zydis 5mg  BID -Depakote DR 250mg  BID -Recommend inpatient  psychiatric treatment. Per Rona Ravens, [facility], patient has been accepted to [facility] [phone] pending labs, EKG, voluntary consent faxed to [phone], attending provider will be Dr. Phineas Inches.   Sunday Corn, NP 02/22/23  7:45 PM

## 2023-02-22 NOTE — BH Assessment (Signed)
Comprehensive Clinical Assessment (CCA) Screening, Triage and Referral Note  02/22/2023 Nathan Holloway [id number]  Disposition: Per Nathan Emery, NP, patient is recommended for inpatient treatment.   The patient demonstrates the following risk factors for suicide: Chronic risk factors for suicide include: psychiatric disorder of schizoaffective disorder . Acute risk factors for suicide include: recent discharge from inpatient psychiatry. Protective factors for this patient include: positive social support, positive therapeutic relationship, and hope for the future. Considering these factors, the overall suicide risk at this point appears to be low. Patient is not appropriate for outpatient follow up.   Chief Complaint:  Chief Complaint  Patient presents with   Schizophrenia   Visit Diagnosis: Schizoaffective disorder, bipolar type [facility])   Patient Reported Information How did you hear about Nathan Holloway? Self  What Is the Reason for Your Visit/Call Today? Nathan Holloway is a 27 year old male presenting to [facility] voluntarily reporting that he feels weird. Pt denies SI, HI, AVH and substance use. Pt was discharged from [facility] Monday.  Patient reports when he got home, he noticed "weird stuff" going on at home related to his skin peeling and his medications looking different than what he was taking when he was at the hospital. Patient also reports noticing a picture in the home of his family that looked like 'voodoo". Patient reports that his mother and grandmother "tone of voice" was not right and felt like they were yelling at him while he had his headphones on listening to music. Patient reports he called the police because he felt wired, and he does not feel safe at home thinking that voodoo is happening at home.   Patient reports he has been compliant with his medications since discharge, and he has an outpatient appointment for medication management on 02/27/23. Patient denies SI, HI, AVH and denies  use of any drugs. Patient is calm, however is paranoid.   Mom reports that patient was discharged and on his second day home he reported that he did not want to take his medications. Mom reports that patient called her a witch and stated that he did not trust her. Mom reports that patient has been looking at movies all day and reports that he called 911 stating that someone was trying to overdose him. Mom feels like patient was discharged too early from inpatient treatment. Mom states that patient first episode was when he was 27 years old after his father died. Patient was hospitalized three times in [place], and they moved to [place] in 2018. Mom reports patient was taking his medications and was stable for five years after his first episode. Mom reports the last few times patient went into treatment he came out and stopped taking his medications and soon after had an episode. Mom reports that last two episodes was around the same time patient thought his girlfriend was cheating on him. Mom thinks that patient needs to go back into inpatient treatment for stabilization.   How Long Has This Been Causing You Problems? 1-6 months  What Do You Feel Would Help You the Most Today? Treatment for Depression or other mood problem   Have You Recently Had Any Thoughts About Hurting Yourself? No  Are You Planning to Commit Suicide/Harm Yourself At This time? No   Have you Recently Had Thoughts About Hurting Someone Nathan Holloway? No  Are You Planning to Harm Someone at This Time? No  Explanation: NA   Have You Used Any Alcohol or Drugs in the Past 24 Hours? No  How Long Ago Did You Use Drugs or Alcohol? No data recorded What Did You Use and How Much? NA   Do You Currently Have a Therapist/Psychiatrist? No  Name of Therapist/Psychiatrist: NA   Have You Been Recently Discharged From Any Office Practice or Programs? No  Explanation of Discharge From Practice/Program: NA    CCA Screening Triage Referral  Assessment Type of Contact: Face-to-Face  Telemedicine Service Delivery:   Is this Initial or Reassessment?   Date Telepsych consult ordered in [facility]:    Time Telepsych consult ordered in [facility]:    Location of Assessment: [facility] Assessment Services  Provider Location: [facility] Assessment Services    Collateral Involvement: MOTHER/GUARDIAN   Does Patient Have a Automotive engineer Guardian? No data recorded Name and Contact of Legal Guardian: No data recorded If Minor and Not Living with Parent(s), Who has Custody? NA  Is CPS involved or ever been involved? Never  Is APS involved or ever been involved? Never   Patient Determined To Be At Risk for Harm To Self or Others Based on Review of Patient Reported Information or Presenting Complaint? No  Method: No Plan  Availability of Means: No access or NA  Intent: Vague intent or NA  Notification Required: No need or identified person  Additional Information for Danger to Others Potential: -- (NA)  Additional Comments for Danger to Others Potential: NA  Are There Guns or Other Weapons in Your Home? No  Types of Guns/Weapons: NA  Are These Weapons Safely Secured?                            -- (NA)  Who Could Verify You Are Able To Have These Secured: MOTHER  Do You Have any Outstanding Charges, Pending Court Dates, Parole/Probation? DENIES  Contacted To Inform of Risk of Harm To Self or Others: Family/Significant Other:   Does Patient Present under Involuntary Commitment? No    [place] of Residence: [place]   Patient Currently Receiving the Following Services: Not Receiving Services   Determination of Need: Urgent (48 hours)   Options For Referral: Medication Management; Outpatient Therapy   Discharge Disposition:     Audree Camel, [facility]

## 2023-02-22 NOTE — ED Notes (Signed)
Pt A&O x 4, no distress noted. Calm & cooperative at present, resting quietly, pending lab results, report & transfer to [facility], rm 505-1.  Monitoring for safety.

## 2023-02-22 NOTE — Progress Notes (Signed)
   02/22/23 1725  BHUC Triage Screening (Walk-ins at [facility] only)  How Did You Hear About [place]? Self  What Is the Reason for Your Visit/Call Today? Nathan Holloway is a 27 year old male presenting to [facility] voluntarily reporting that he feels weird. Pt denies SI, HI, AVH and substance use. Pt was discharged from [facility] Monday.  How Long Has This Been Causing You Problems? 1-6 months  Have You Recently Had Any Thoughts About Hurting Yourself? No  Are You Planning to Commit Suicide/Harm Yourself At This time? No  Have you Recently Had Thoughts About Hurting Someone [name]? No  Are You Planning To Harm Someone At This Time? No  Explanation: NA  Are you currently experiencing any auditory, visual or other hallucinations? No  Have You Used Any Alcohol or Drugs in the Past 24 Hours? No  What Did You Use and How Much? NA  Do you have any current medical co-morbidities that require immediate attention? No  Clinician description of patient physical appearance/behavior: calm  What Do You Feel Would Help You the Most Today? Treatment for Depression or other mood problem  If access to [facility] was not available, would you have sought care in the Emergency Department? No  Determination of Need Urgent (48 hours)  Options For Referral Medication Management;Outpatient Therapy

## 2023-02-22 NOTE — ED Notes (Signed)
Pt admitted to continuous observation pending transfer to [facility] pending labs due to delusional and paranoid thinking. Pt pleasant and cooperative during assessment. Pt states, "there was some weird things going on in my house between my mother and grandmother. I started looking around and felt the energy. It was weird. Jesus told me something was wrong". Pt unable to be reoriented at present. Denies SI/HI/AVH. Oriented to unit and unit rules. Will monitor for safety.

## 2023-02-22 NOTE — ED Notes (Signed)
Patient arrived on unit. Patient calm and cooperative. Patient safe on unit with continued monitoring. 

## 2023-02-22 NOTE — ED Notes (Signed)
Report called to RN Helaine Chess, [facility] rm 505.  Librarian, academic.

## 2023-02-23 ENCOUNTER — Encounter (HOSPITAL_COMMUNITY): Payer: Self-pay | Admitting: Behavioral Health

## 2023-02-23 ENCOUNTER — Other Ambulatory Visit: Payer: Self-pay

## 2023-02-23 ENCOUNTER — Inpatient Hospital Stay (HOSPITAL_COMMUNITY)
Admission: AD | Admit: 2023-02-23 | Discharge: 2023-02-28 | DRG: 885 | Disposition: A | Discharge status: Home / Self Care | Payer: BLUE CROSS/BLUE SHIELD | Source: Intra-hospital | Attending: Psychiatry | Admitting: Psychiatry

## 2023-02-23 DIAGNOSIS — Z5986 Financial insecurity: Secondary | ICD-10-CM

## 2023-02-23 DIAGNOSIS — Z79899 Other long term (current) drug therapy: Secondary | ICD-10-CM | POA: Diagnosis not present

## 2023-02-23 DIAGNOSIS — F250 Schizoaffective disorder, bipolar type: Secondary | ICD-10-CM | POA: Diagnosis present

## 2023-02-23 MED ORDER — MAGNESIUM HYDROXIDE 400 MG/5ML PO SUSP: 30.0000 mL | Freq: Every day | ORAL | Status: DC | PRN

## 2023-02-23 MED ORDER — LORAZEPAM 1 MG PO TABS
2.0000 mg | ORAL_TABLET | Freq: Three times a day (TID) | ORAL | Status: DC | PRN
  Administered 2023-02-24 – 2023-02-26 (×2): 2 mg via ORAL
  Filled 2023-02-23 (×2): qty 2

## 2023-02-23 MED ORDER — DIPHENHYDRAMINE HCL 50 MG/ML IJ SOLN: 50.0000 mg | Freq: Three times a day (TID) | INTRAMUSCULAR | Status: DC | PRN

## 2023-02-23 MED ORDER — TRAZODONE HCL 50 MG PO TABS
50.0000 mg | ORAL_TABLET | Freq: Every evening | ORAL | Status: DC | PRN
  Administered 2023-02-23 – 2023-02-27 (×5): 50 mg via ORAL
  Filled 2023-02-23 (×5): qty 1

## 2023-02-23 MED ORDER — DIVALPROEX SODIUM 250 MG PO DR TAB
250.0000 mg | DELAYED_RELEASE_TABLET | Freq: Two times a day (BID) | ORAL | Status: DC
  Administered 2023-02-23 – 2023-02-28 (×11): 250 mg via ORAL
  Filled 2023-02-23 (×15): qty 1

## 2023-02-23 MED ORDER — ALUM & MAG HYDROXIDE-SIMETH 200-200-20 MG/5ML PO SUSP: 30.0000 mL | ORAL | Status: DC | PRN

## 2023-02-23 MED ORDER — DIPHENHYDRAMINE HCL 25 MG PO CAPS
50.0000 mg | ORAL_CAPSULE | Freq: Three times a day (TID) | ORAL | Status: DC | PRN
  Administered 2023-02-24 – 2023-02-26 (×2): 50 mg via ORAL
  Filled 2023-02-23 (×2): qty 2

## 2023-02-23 MED ORDER — ACETAMINOPHEN 325 MG PO TABS: 650.0000 mg | ORAL_TABLET | Freq: Four times a day (QID) | ORAL | Status: DC | PRN

## 2023-02-23 MED ORDER — OLANZAPINE 5 MG PO TBDP
5.0000 mg | ORAL_TABLET | Freq: Two times a day (BID) | ORAL | Status: DC
  Administered 2023-02-23 – 2023-02-24 (×3): 5 mg via ORAL
  Filled 2023-02-23 (×8): qty 1

## 2023-02-23 MED ORDER — LORAZEPAM 2 MG/ML IJ SOLN: 2.0000 mg | Freq: Three times a day (TID) | INTRAMUSCULAR | Status: DC | PRN

## 2023-02-23 MED ORDER — HALOPERIDOL 5 MG PO TABS
5.0000 mg | ORAL_TABLET | Freq: Three times a day (TID) | ORAL | Status: DC | PRN
  Administered 2023-02-24 – 2023-02-26 (×2): 5 mg via ORAL
  Filled 2023-02-23 (×2): qty 1

## 2023-02-23 MED ORDER — HALOPERIDOL LACTATE 5 MG/ML IJ SOLN: 5.0000 mg | Freq: Three times a day (TID) | INTRAMUSCULAR | Status: DC | PRN

## 2023-02-23 NOTE — ED Notes (Signed)
Safe Transport requested to [facility], rm 505

## 2023-02-23 NOTE — Plan of Care (Signed)
  Problem: Coping: Goal: Ability to verbalize frustrations and anger appropriately will improve Outcome: Progressing   Problem: Coping: Goal: Ability to demonstrate self-control will improve Outcome: Progressing   Problem: Health Behavior/Discharge Planning: Goal: Compliance with treatment plan for underlying cause of condition will improve Outcome: Progressing   Problem: Safety: Goal: Periods of time without injury will increase Outcome: Progressing   

## 2023-02-23 NOTE — Progress Notes (Signed)
   02/23/23 4098  Psych Admission Type (Psych Patients Only)  Admission Status Voluntary  Psychosocial Assessment  Patient Complaints Anxiety;Suspiciousness  Eye Contact Fair  Facial Expression Flat  Affect Appropriate to circumstance  Speech Logical/coherent  Interaction Assertive  Motor Activity Other (Comment) (WNL)  Appearance/Hygiene Unremarkable  Behavior Characteristics Cooperative;Calm  Mood Pleasant  Thought Process  Coherency Disorganized  Content Delusions;Paranoia  Delusions Paranoid  Perception Derealization  Hallucination None reported or observed  Judgment Impaired  Confusion None  Danger to Self  Current suicidal ideation? Denies  Agreement Not to Harm Self Yes  Description of Agreement verbal  Danger to Others  Danger to Others None reported or observed

## 2023-02-23 NOTE — Progress Notes (Addendum)
Pharmacy DC meds from [facility]   Spoke with Pharmacy at [facility] At [phone] Pt was discharged on  Abilify 400 mg IM q28 days given 02/16/23 Valproic acid liq 250/5 5 ml bid Trazodone 100 mg hsprn Zyprexa Zydis 5 mg hs  Hydrox 25 mg tid prn   Peggye Fothergill, Pharm D

## 2023-02-23 NOTE — Group Note (Signed)
Date:  02/23/2023 Time:  9:38 PM  Group Topic/Focus:  Wrap-Up Group:   The focus of this group is to help patients review their daily goal of treatment and discuss progress on daily workbooks.    Participation Level:  Active  Participation Quality:  Appropriate  Affect:  Appropriate  Cognitive:  Appropriate  Insight: Appropriate  Engagement in Group:  Engaged and Improving  Modes of Intervention:  Activity and Problem-solving  Additional Comments: None  Nathan Holloway 02/23/2023, 9:38 PM

## 2023-02-23 NOTE — Progress Notes (Signed)
Admission Note: report received from Latricia RN patient is a 8 VOL year old male from [facility] Pt was calm and cooperative during assessment. Denies SI/HI/AVH. Admission plan of care reviewed with pt, consent signed.  Personal belongings/skin assessment completed.   No contraband found.  Patient oriented to the unit, staff and room.  Routine safety checks initiated.  Verbalizes understanding of unit rules/protocols.   Patient is presently safe on the unit. No unsafe behaviors noted.  Q 15 minute safety checks maintained per unit protocol.

## 2023-02-23 NOTE — Progress Notes (Signed)
   02/23/23 2245  Psych Admission Type (Psych Patients Only)  Admission Status Voluntary  Psychosocial Assessment  Patient Complaints Anxiety;Suspiciousness  Eye Contact Fair  Facial Expression Flat  Affect Appropriate to circumstance  Speech Logical/coherent  Interaction Assertive  Motor Activity Slow  Appearance/Hygiene Unremarkable  Behavior Characteristics Cooperative  Mood Pleasant  Aggressive Behavior  Effect No apparent injury  Thought Process  Coherency WDL  Content Paranoia;Delusions  Perception Derealization  Hallucination None reported or observed  Judgment Impaired  Confusion None  Danger to Self  Current suicidal ideation? Denies  Danger to Others  Danger to Others None reported or observed

## 2023-02-23 NOTE — BHH Suicide Risk Assessment (Signed)
Suicide Risk Assessment  Admission Assessment    [facility] Admission Suicide Risk Assessment    Demographic factors:  Male Current Mental Status:  NA Loss Factors:  NA Historical Factors:  Impulsivity Risk Reduction Factors:  Religious beliefs about death  Total Time spent with patient: 45 minutes Principal Problem: Schizoaffective disorder, bipolar type (HCC) Diagnosis:  Principal Problem:   Schizoaffective disorder, bipolar type (HCC)  Subjective Data:  Nathan Holloway is a 27 year old male voluntarily admitted to [facility] from [facility] for "I did not feel safe" and symptoms consistent with his psychiatric conditions.  Past psychiatric history includes diagnosis of schizoaffective disorder, bipolar type; numerous hospitalizations; no reported history of harm or suicidal thoughts/ideation/attempts.   Nathan Holloway was at home with his mother and grandma.  He reported that he "did not feel safe" with his mother and grandma, did not trust the meds that he received due to them being different colors, and had a sensation that the skin was peeling from his forehead (although he also says that his grandma put something there).  He also describes "voodoos" which prompted him to call EMS. Nathan Holloway was recently discharged from [facility] on 02/20/2023, where he had a 7-day stay and received an injection of Abilify Maintena 400 (confirmed by our pharmacist who called [facility]).    Collateral (mother, Therisa Doyne): His mother confirmed the HPI above.  Noted that he has patterns where he moves out of the house to gain independence but then experiences these crises due to relationship issues, financial issues, and stopping his meds.  She confirmed his medications including his positive response in the past to Zyprexa, low-dose Depakote.  She believes he has had poor responses to Abilify in the past (he is currently on a Abilify LAI) but she is not 100% sure.  We discussed how long-acting injectables could be  useful for [facility].  She noted that he has scheduled follow-up with outpatient psychiatry at [facility] on 02/27/2023.  I encouraged her to change this appointment as he would likely not be discharged by that date.  She was agreeable to this.  Furthermore I clarified if he had a PCP to follow up on his elevated creatinine levels.  She says he has a PCP and they will schedule an appointment with this PCP following discharge.  She expressed concern about his insurance status.  I informed them that [facility] offers a well long-acting injectable clinic that could be useful in the future for them.  However I informed her that she could likely get these injections through [facility].  Continued Clinical Symptoms:  Alcohol Use Disorder Identification Test Final Score (AUDIT): 0 The "Alcohol Use Disorders Identification Test", Guidelines for Use in Primary Care, Second Edition.  World Science writer [facility]). Score between 0-7:  no or low risk or alcohol related problems. Score between 8-15:  moderate risk of alcohol related problems. Score between 16-19:  high risk of alcohol related problems. Score 20 or above:  warrants further diagnostic evaluation for alcohol dependence and treatment.   CLINICAL FACTORS:   Currently Psychotic   Musculoskeletal: Strength & Muscle Tone: within normal limits Gait & Station: normal Patient leans: N/A  Psychiatric Specialty Exam:  Presentation  General Appearance:  Well Groomed; Casual  Eye Contact: Good  Speech: Normal Rate  Speech Volume: Normal  Handedness: Right   Mood and Affect  Mood: Euthymic  Affect: Appropriate   Thought Process  Thought Processes: Coherent; Linear  Descriptions of Associations:Circumstantial  Orientation:Full (Time, Place and Person)  Thought Content:Paranoid Ideation; Other (comment) (paranoia about family. unclear if delusion or appropriate)  History of Schizophrenia/Schizoaffective  disorder:Yes  Duration of Psychotic Symptoms:Greater than six months  Hallucinations:Hallucinations: Other (comment) Description of Visual Hallucinations: describes grandma putting something on forehead. maybe be hallucation or true. unclear  Ideas of Reference:None  Suicidal Thoughts:Suicidal Thoughts: No  Homicidal Thoughts:Homicidal Thoughts: No   Sensorium  Memory: Recent Fair  Judgment: Fair  Insight: Fair   Chartered certified accountant: Fair  Attention Span: Fair  Recall: [employer] of Knowledge: Fair  Language: Fair   Psychomotor Activity  Psychomotor Activity: Psychomotor Activity: Normal   Assets  Assets: Desire for Improvement; Housing; Vocational/Educational; Communication Skills   Sleep  Sleep: Sleep: Good Number of Hours of Sleep: 8    Physical Exam: Physical Exam Vitals and nursing note reviewed.  HENT:     Head: Normocephalic and atraumatic.  Pulmonary:     Effort: Pulmonary effort is normal.  Neurological:     General: No focal deficit present.     Mental Status: He is alert.    Review of Systems  Constitutional:  Negative for fever.  Cardiovascular:  Negative for chest pain and palpitations.  Gastrointestinal:  Negative for constipation, diarrhea, nausea and vomiting.  Neurological:  Negative for dizziness, weakness and headaches.   Blood pressure (!) 149/88, pulse 91, temperature 98.1 F (36.7 C), temperature source Oral, resp. rate 18, height 5\' 9"  (1.753 m), weight 73.5 kg, SpO2 100 %. Body mass index is 23.92 kg/m.   COGNITIVE FEATURES THAT CONTRIBUTE TO RISK:  Loss of executive function    SUICIDE RISK:   Moderate:  Frequent suicidal ideation with limited intensity, and duration, some specificity in terms of plans, no associated intent, good self-control, limited dysphoria/symptomatology, some risk factors present, and identifiable protective factors, including available and accessible social  support.  PLAN OF CARE:   ASSESSMENT: Diagnoses / Active Problems: Schizoaffective disorder, bipolar type (HCC)   Clyde appears to be experiencing an episode of his schizoaffective disorder that never resolved while at previous hospitalization.  For psychosis, he has possible delusions, possible hallucinations, elements of disorganized speech.  For mood symptoms, he does not appear to have clear depressive symptoms or manic symptoms; he has been sleeping 6 to 8 hours a night, his mood is euthymic, he is somewhat circumstantial but easily redirected and logical.  He recently received Abilify Sustena 400 and he may or may not be at a therapeutic dose by this point.  He continues to have breakthrough symptoms so bridge treatment with Zyprexa is indicated.  Furthermore his mother reported that he has not responded to Abilify in the past.  Given the lack of mood symptoms, low-dose Depakote may be adequate.  Blood Depakote level was 17.   Medically, I am concerned about his elevated creatinine.  A1c is normal.  May be blood pressure or medication related.  Mother confirmed he has a PCP.     PLAN: Safety and Monitoring:             -- Involuntary admission to inpatient psychiatric unit for safety, stabilization and treatment             -- Daily contact with patient to assess and evaluate symptoms and progress in treatment             -- Patient's case to be discussed in multi-disciplinary team meeting             --  Observation Level : q15 minute checks             -- Vital signs:  q12 hours             -- Precautions: suicide, elopement, and assault   2. Psychiatric Diagnoses and Treatment:  -- Continue Zyprexa 5 mg twice daily for psychosis             -- Continue Depakote 250 twice daily for mood stabilization             -- Trazodone 50 as needed for sleep --  The risks/benefits/side-effects/alternatives to this medication were discussed in detail with the patient and time was given for  questions. The patient consents to medication trial.              -- Metabolic profile and EKG monitoring obtained while on an atypical antipsychotic (BMI: 24, lipid Panel: Elevated LDL otherwise normal ,HbgA1c: 4.2 QTc:)              -- Encouraged patient to participate in unit milieu and in scheduled group therapies              -- Short Term Goals: Ability to identify changes in lifestyle to reduce recurrence of condition will improve, Ability to verbalize feelings will improve, Ability to disclose and discuss suicidal ideas, Ability to demonstrate self-control will improve, Ability to identify and develop effective coping behaviors will improve, Ability to maintain clinical measurements within normal limits will improve, Compliance with prescribed medications will improve, and Ability to identify triggers associated with substance abuse/mental health issues will improve             -- Long Term Goals: Improvement in symptoms so as ready for discharge                3. Medical Issues Being Addressed:              -- Follow-up outpatient for elevated creatinine.  Repeat metabolic profile and get UA outpatient.             -- Monitor blood pressure and will treat if consistently above 160 while inpatient.    4. Discharge Planning:              -- Social work and case management to assist with discharge planning and identification of hospital follow-up needs prior to discharge             -- Estimated LOS: 7-10 days              -- Discharge Concerns: Need to establish a safety plan; Medication compliance and effectiveness             -- Discharge Goals: Return home with outpatient referrals for mental health follow-up including medication management/psychotherapy   I certify that inpatient services furnished can reasonably be expected to improve the patient's condition.     Meryl Dare, MD 02/23/2023, 4:25 PM
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
## 2023-02-23 NOTE — Tx Team (Signed)
Initial Treatment Plan 02/23/2023 5:51 AM Nathan Holloway ZOX:[id number]    PATIENT STRESSORS: Medication change or noncompliance     PATIENT STRENGTHS: Capable of independent living  Religious Affiliation    PATIENT IDENTIFIED PROBLEMS: Believe mom and Grandma is doing witchcraft                     DISCHARGE CRITERIA:  Improved stabilization in mood, thinking, and/or behavior  PRELIMINARY DISCHARGE PLAN: Return to previous living arrangement  PATIENT/FAMILY INVOLVEMENT: This treatment plan has been presented to and reviewed with the patient, Nathan Holloway, and/or family member, .  The patient and family have been given the opportunity to ask questions and make suggestions.  Jarome Matin, RN 02/23/2023, 5:51 AM

## 2023-02-23 NOTE — Group Note (Signed)
Date:  02/23/2023 Time:  9:49 AM  Group Topic/Focus:  Goals Group:   The focus of this group is to help patients establish daily goals to achieve during treatment and discuss how the patient can incorporate goal setting into their daily lives to aide in recovery. Orientation:   The focus of this group is to educate the patient on the purpose and policies of crisis stabilization and provide a format to answer questions about their admission.  The group details unit policies and expectations of patients while admitted.    Participation Level:  Active  Participation Quality:  Appropriate  Affect:  Appropriate  Cognitive:  Appropriate  Insight: Appropriate  Engagement in Group:  Engaged  Modes of Intervention:  Discussion  Additional Comments:  Patient attended morning orientation/goal group and said that his goal for today is to participate in all groups.   Madicyn Mesina W Lamyiah Crawshaw 02/23/2023, 9:49 AM

## 2023-02-23 NOTE — H&P (Signed)
Psychiatric Admission Assessment Adult  Patient Identification: Nathan Holloway MRN:  [id number] Date of Evaluation:  02/23/2023 Chief Complaint:  Schizoaffective disorder, bipolar type (HCC) [F25.0] Principal Diagnosis: Schizoaffective disorder, bipolar type (HCC) Diagnosis:  Principal Problem:   Schizoaffective disorder, bipolar type (HCC)  History of Present Illness: Nathan Holloway is a 27 year old male voluntarily admitted to [facility] from [facility] for "I did not feel safe" and symptoms consistent with his psychiatric conditions.  Past psychiatric history includes diagnosis of schizoaffective disorder, bipolar type; numerous hospitalizations; no reported history of harm or suicidal thoughts/ideation/attempts.  Nathan Holloway was at home with his mother and grandma.  He reported that he "did not feel safe" with his mother and grandma, did not trust the meds that he received due to them being different colors, and had a sensation that the skin was peeling from his forehead (although he also says that his grandma put something there).  He also describes "voodoos" which prompted him to call EMS. Nathan Holloway was recently discharged from [facility] on 02/20/2023, where he had a 7-day stay and received an injection of Abilify Maintena 400 (confirmed by our pharmacist who called [facility]).   Collateral (mother, Nathan Holloway): His mother confirmed the HPI above.  Noted that he has patterns where he moves out of the house to gain independence but then experiences these crises due to relationship issues, financial issues, and stopping his meds.  She confirmed his medications including his positive response in the past to Zyprexa, low-dose Depakote.  She believes he has had poor responses to Abilify in the past (he is currently on a Abilify LAI) but she is not 100% sure.  We discussed how long-acting injectables could be useful for [facility].  She noted that he has scheduled follow-up with outpatient psychiatry at [facility] on  02/27/2023.  I encouraged her to change this appointment as he would likely not be discharged by that date.  She was agreeable to this.  Furthermore I clarified if he had a PCP to follow up on his elevated creatinine levels.  She says he has a PCP and they will schedule an appointment with this PCP following discharge.  She expressed concern about his insurance status.  I informed them that [facility] offers a well long-acting injectable clinic that could be useful in the future for them.  However I informed her that she could likely get these injections through [facility].  Associated Signs/Symptoms: Depression Symptoms: None (Hypo) Manic Symptoms:  none Anxiety Symptoms:  none Psychotic Symptoms: Disorganized speech, paranoia, possible sensory hallucinations PTSD Symptoms: Endorses past traumatic experience(s), nightmares, flashbacks, hypervigilance. Total Time spent with patient: 45 minutes  Past Psychiatric History: Diagnosis: Schizoaffective, bipolar type  Medication history (per chart review): Olanzapine 15 nightly, Depakene 500 twice daily--good response to this pair but had weight gain. Latuda 80 nightly, Trileptal 150 twice daily --unclear response to this pair.    Prior Outpatient Therapy: No.   Alcohol Screening: 1. How often do you have a drink containing alcohol?: Never 2. How many drinks containing alcohol do you have on a typical day when you are drinking?: 1 or 2 3. How often do you have six or more drinks on one occasion?: Never AUDIT-C Score: 0 4. How often during the last year have you found that you were not able to stop drinking once you had started?: Never 5. How often during the last year have you failed to do what was normally expected from you because of  drinking?: Never 6. How often during the last year have you needed a first drink in the morning to get yourself going after a heavy drinking session?: Never 7. How often during the last  year have you had a feeling of guilt of remorse after drinking?: Never 8. How often during the last year have you been unable to remember what happened the night before because you had been drinking?: Never 9. Have you or someone else been injured as a result of your drinking?: No 10. Has a relative or friend or a doctor or another health worker been concerned about your drinking or suggested you cut down?: No Alcohol Use Disorder Identification Test Final Score (AUDIT): 0 Alcohol Brief Interventions/Follow-up: Patient Refused Reports no substance use.  Previous Psychotropic Medications: Yes  Psychological Evaluations: No  Past Medical History:  Endorses seizure as in the past. Past Medical History:  Diagnosis Date   Manic behavior (HCC)    Psychotic affective disorder (HCC) 08/30/2015   History reviewed. No pertinent surgical history. Family History:  Family History  Problem Relation Age of Onset   Healthy Mother    Family Psychiatric  History: Did not provide this history. Tobacco Screening:  Social History   Tobacco Use  Smoking Status Never  Smokeless Tobacco Never        Social History:  Social History   Substance and Sexual Activity  Alcohol Use No     Social History   Substance and Sexual Activity  Drug Use Not Currently   Types: Marijuana    Additional Social History:  Lives at home with his grandma and mother but he is hoping to leave this living situation due to want for independence and some issues with their relationship.  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  Highly spiritual, identifies Nathan Holloway, love and faith fellowship is his church.      Allergies:  Allergies  Allergen Reactions   Risperidone And Related Other (See Comments)    Diastatic - patient states he almost died taking risperdal   Milk (Cow) Other (See Comments)    GI Upset   Pork-Derived Products Other  (See Comments)    GI Upset   Lab Results:  Results for orders placed or performed during the hospital encounter of 02/22/23 (from the past 48 hour(s))  CBC with Differential/Platelet     Status: None   Collection Time: 02/22/23  6:34 PM  Result Value Ref Range   WBC 9.2 4.0 - 10.5 K/uL   RBC 5.30 4.22 - 5.81 MIL/uL   Hemoglobin 14.7 13.0 - 17.0 g/dL   HCT 16.1 09.6 - 04.5 %   MCV 86.8 80.0 - 100.0 fL   MCH 27.7 26.0 - 34.0 pg   MCHC 32.0 30.0 - 36.0 g/dL   RDW 40.9 81.1 - 91.4 %   Platelets 235 150 - 400 K/uL   nRBC 0.0 0.0 - 0.2 %   Neutrophils Relative % 68 %   Neutro Abs 6.3 1.7 - 7.7 K/uL   Lymphocytes Relative 27 %   Lymphs Abs 2.4 0.7 - 4.0 K/uL   Monocytes Relative 5 %   Monocytes Absolute 0.4 0.1 - 1.0 K/uL   Eosinophils Relative 0 %   Eosinophils Absolute 0.0 0.0 - 0.5 K/uL   Basophils Relative 0 %   Basophils Absolute 0.0 0.0 - 0.1 K/uL   Immature Granulocytes 0 %   Abs Immature Granulocytes 0.02 0.00 - 0.07  K/uL    Comment: Performed at [facility], [address], [place], [place] [address]  Comprehensive metabolic panel     Status: Abnormal   Collection Time: 02/22/23  6:34 PM  Result Value Ref Range   Sodium 139 135 - 145 mmol/L   Potassium 4.7 3.5 - 5.1 mmol/L   Chloride 101 98 - 111 mmol/L   CO2 29 22 - 32 mmol/L   Glucose, Bld 136 (H) 70 - 99 mg/dL    Comment: Glucose reference range applies only to samples taken after fasting for at least 8 hours.   BUN 8 6 - 20 mg/dL   Creatinine, Ser 6.04 (H) 0.61 - 1.24 mg/dL   Calcium 9.7 8.9 - 54.0 mg/dL   Total Protein 7.0 6.5 - 8.1 g/dL   Albumin 4.1 3.5 - 5.0 g/dL   AST 20 15 - 41 U/L   ALT 15 0 - 44 U/L   Alkaline Phosphatase 58 38 - 126 U/L   Total Bilirubin 0.8 0.3 - 1.2 mg/dL   GFR, Estimated >98 >11 mL/min    Comment: (NOTE) Calculated using the CKD-EPI Creatinine Equation (2021)    Anion gap 9 5 - 15    Comment: Performed at [facility], [address], [place], [place] [address]   Magnesium     Status: None   Collection Time: 02/22/23  6:34 PM  Result Value Ref Range   Magnesium 2.0 1.7 - 2.4 mg/dL    Comment: Performed at [facility], [address] [place], [place] [address]  Valproic acid level     Status: Abnormal   Collection Time: 02/22/23  6:34 PM  Result Value Ref Range   Valproic Acid Lvl 17 (L) 50.0 - 100.0 ug/mL    Comment: Performed at [facility], [address] [place], [place] [address]  Hemoglobin A1c     Status: Abnormal   Collection Time: 02/22/23  6:37 PM  Result Value Ref Range   Hgb A1c MFr Bld 4.2 (L) 4.8 - 5.6 %    Comment: (NOTE) Pre diabetes:          5.7%-6.4%  Diabetes:              >6.4%  Glycemic control for   <7.0% adults with diabetes    Mean Plasma Glucose 73.84 mg/dL    Comment: Performed at [facility], [address], [place], [place] [address]  Ethanol     Status: None   Collection Time: 02/22/23  6:37 PM  Result Value Ref Range   Alcohol, Ethyl (B) <10 <10 mg/dL    Comment: (NOTE) Lowest detectable limit for serum alcohol is 10 mg/dL.  For medical purposes only. Performed at [facility], [address] [place], [place] [address]   Lipid panel     Status: None   Collection Time: 02/22/23  6:37 PM  Result Value Ref Range   Cholesterol 189 0 - 200 mg/dL   Triglycerides 284 <132 mg/dL   HDL 66 >44 mg/dL   Total CHOL/HDL Ratio 2.9 RATIO   VLDL 27 0 - 40 mg/dL   LDL Cholesterol 96 0 - 99 mg/dL    Comment:        Total Cholesterol/HDL:CHD Risk Coronary Heart Disease Risk Table                     Men   Women  1/2 Average Risk  3.4   3.3  Average Risk       5.0   4.4  2 X Average Risk   9.6   7.1  3 X Average Risk  23.4   11.0        Use the calculated Patient Ratio above and the CHD Risk Table to determine the patient's CHD Risk.        ATP III CLASSIFICATION (LDL):  <100     mg/dL   Optimal  161-096  mg/dL   Near or Above                    Optimal  130-159  mg/dL    Borderline  045-409  mg/dL   High  >811     mg/dL   Very High Performed at [facility], [address], [place], [place] [address]   TSH     Status: None   Collection Time: 02/22/23  6:37 PM  Result Value Ref Range   TSH 1.855 0.350 - 4.500 uIU/mL    Comment: Performed by a 3rd Generation assay with a functional sensitivity of <=0.01 uIU/mL. Performed at [facility], [address] [place], [place] [address]   POCT Urine Drug Screen - (I-Screen)     Status: Normal   Collection Time: 02/22/23  6:45 PM  Result Value Ref Range   POC Amphetamine UR None Detected NONE DETECTED (Cut Off Level 1000 ng/mL)   POC Secobarbital (BAR) None Detected NONE DETECTED (Cut Off Level 300 ng/mL)   POC Buprenorphine (BUP) None Detected NONE DETECTED (Cut Off Level 10 ng/mL)   POC Oxazepam (BZO) None Detected NONE DETECTED (Cut Off Level 300 ng/mL)   POC Cocaine UR None Detected NONE DETECTED (Cut Off Level 300 ng/mL)   POC Methamphetamine UR None Detected NONE DETECTED (Cut Off Level 1000 ng/mL)   POC Morphine None Detected NONE DETECTED (Cut Off Level 300 ng/mL)   POC Methadone UR None Detected NONE DETECTED (Cut Off Level 300 ng/mL)   POC Oxycodone UR None Detected NONE DETECTED (Cut Off Level 100 ng/mL)   POC Marijuana UR None Detected NONE DETECTED (Cut Off Level 50 ng/mL)    Blood Alcohol level:  Lab Results  Component Value Date   ETH <10 02/22/2023   ETH <10 02/14/2023    Metabolic Disorder Labs:  Lab Results  Component Value Date   HGBA1C 4.2 (L) 02/22/2023   MPG 73.84 02/22/2023   MPG 88 02/14/2023   Lab Results  Component Value Date   PROLACTIN 11.2 04/07/2022   PROLACTIN 3.7 (L) 07/04/2020   Lab Results  Component Value Date   CHOL 189 02/22/2023   TRIG 134 02/22/2023   HDL 66 02/22/2023   CHOLHDL 2.9 02/22/2023   VLDL 27 02/22/2023   LDLCALC 96 02/22/2023   LDLCALC 117 (H) 02/14/2023    Current Medications: Current Facility-Administered Medications   Medication Dose Route Frequency Provider Last Rate Last Admin   acetaminophen (TYLENOL) tablet 650 mg  650 mg Oral Q6H PRN Sunday Corn, NP       alum & mag hydroxide-simeth (MAALOX/MYLANTA) 200-200-20 MG/5ML suspension 30 mL  30 mL Oral Q4H PRN Sunday Corn, NP       diphenhydrAMINE (BENADRYL) capsule 50 mg  50 mg Oral TID PRN Sunday Corn, NP       Or   diphenhydrAMINE (BENADRYL) injection 50 mg  50 mg Intramuscular TID PRN Sunday Corn, NP  divalproex (DEPAKOTE) DR tablet 250 mg  250 mg Oral BID Sunday Corn, NP   250 mg at 02/23/23 1610   haloperidol (HALDOL) tablet 5 mg  5 mg Oral TID PRN Sunday Corn, NP       Or   haloperidol lactate (HALDOL) injection 5 mg  5 mg Intramuscular TID PRN Sunday Corn, NP       LORazepam (ATIVAN) tablet 2 mg  2 mg Oral TID PRN Sunday Corn, NP       Or   LORazepam (ATIVAN) injection 2 mg  2 mg Intramuscular TID PRN Sunday Corn, NP       magnesium hydroxide (MILK OF MAGNESIA) suspension 30 mL  30 mL Oral Daily PRN Sunday Corn, NP       OLANZapine zydis (ZYPREXA) disintegrating tablet 5 mg  5 mg Oral BID Sunday Corn, NP   5 mg at 02/23/23 9604   traZODone (DESYREL) tablet 50 mg  50 mg Oral QHS PRN Sunday Corn, NP       PTA Medications: Medications Prior to Admission  Medication Sig Dispense Refill Last Dose   ARIPiprazole ER (ABILIFY MAINTENA) 400 MG SRER injection Inject 400 mg into the muscle every 28 (twenty-eight) days.      OLANZapine zydis (ZYPREXA) 5 MG disintegrating tablet Take 5 mg by mouth at bedtime.      traZODone (DESYREL) 100 MG tablet Take 100 mg by mouth at bedtime.      valproic acid (DEPAKENE) 250 MG/5ML solution Take 250 mg by mouth 2 (two) times daily.      hydrOXYzine (ATARAX) 25 MG tablet Take 1 tablet (25 mg total) by mouth 3 (three) times daily as needed for anxiety. 30 tablet 0       Psychiatric Specialty Exam:  Presentation  General Appearance:  Well  Groomed; Casual  Behavior: Calm and cooperative  Eye Contact: Good  Speech: Normal Rate  Speech Volume: Normal  Handedness: Right   Mood and Affect  Mood: Euthymic  Affect: Appropriate   Thought Process  Thought Processes: Coherent; Linear  Duration of Psychotic Symptoms: At least 10 days Past Diagnosis of Schizophrenia or Psychoactive disorder: Yes  Descriptions of Associations:Circumstantial  Orientation:Full (Time, Place and Person)  Thought Content:Paranoid Ideation; Other (comment) (paranoia about family. unclear if delusion or appropriate)  Hallucinations:Hallucinations: Other (comment) Description of Visual Hallucinations: describes grandma putting something on forehead. maybe be hallucation or true. unclear  Ideas of Reference:None  Suicidal Thoughts:Suicidal Thoughts: No  Homicidal Thoughts:Homicidal Thoughts: No   Sensorium  Memory: Recent Fair  Judgment: Fair  Insight: Fair   Chartered certified accountant: Fair  Attention Span: Fair  Recall: [employer] of Knowledge: Fair  Language: Fair   Psychomotor Activity  Psychomotor Activity: Psychomotor Activity: Normal   Assets  Assets: Desire for Improvement; Housing; Vocational/Educational; Communication Skills   Sleep  Sleep: Sleep: Good Number of Hours of Sleep: 8    Physical Exam: Physical Exam Vitals and nursing note reviewed.  HENT:     Head: Normocephalic and atraumatic.  Pulmonary:     Effort: Pulmonary effort is normal.  Neurological:     General: No focal deficit present.     Mental Status: He is alert.    Review of Systems  Constitutional:  Negative for fever.  Cardiovascular:  Negative for chest pain and palpitations.  Gastrointestinal:  Negative for constipation, diarrhea, nausea and vomiting.  Neurological:  Negative for dizziness, weakness and headaches.  Blood pressure (!) 149/88, pulse 91, temperature 98.1 F (36.7 C), temperature  source Oral, resp. rate 18, height 5\' 9"  (1.753 m), weight 73.5 kg, SpO2 100 %. Body mass index is 23.92 kg/m.     ASSESSMENT:   Diagnoses / Active Problems: Schizoaffective disorder, bipolar type (HCC)  Clyde appears to be experiencing an episode of his schizoaffective disorder that never resolved while at previous hospitalization.  For psychosis, he has possible delusions, possible hallucinations, elements of disorganized speech.  For mood symptoms, he does not appear to have clear depressive symptoms or manic symptoms; he has been sleeping 6 to 8 hours a night, his mood is euthymic, he is somewhat circumstantial but easily redirected and logical.  He recently received Abilify Sustena 400 and he may or may not be at a therapeutic dose by this point.  He continues to have breakthrough symptoms so bridge treatment with Zyprexa is indicated.  Furthermore his mother reported that he has not responded to Abilify in the past.  Given the lack of mood symptoms, low-dose Depakote may be adequate.  Blood Depakote level was 17.  Medically, I am concerned about his elevated creatinine.  A1c is normal.  May be blood pressure or medication related.  Mother confirmed he has a PCP.   PLAN: Safety and Monitoring:             -- Involuntary admission to inpatient psychiatric unit for safety, stabilization and treatment             -- Daily contact with patient to assess and evaluate symptoms and progress in treatment             -- Patient's case to be discussed in multi-disciplinary team meeting             -- Observation Level : q15 minute checks             -- Vital signs:  q12 hours             -- Precautions: suicide, elopement, and assault   2. Psychiatric Diagnoses and Treatment:  -- Continue Zyprexa 5 mg twice daily for psychosis  -- Continue Depakote 250 twice daily for mood stabilization  -- Trazodone 50 as needed for sleep --  The risks/benefits/side-effects/alternatives to this medication  were discussed in detail with the patient and time was given for questions. The patient consents to medication trial.              -- Metabolic profile and EKG monitoring obtained while on an atypical antipsychotic (BMI: 24, lipid Panel: Elevated LDL otherwise normal ,HbgA1c: 4.2 QTc:)              -- Encouraged patient to participate in unit milieu and in scheduled group therapies              -- Short Term Goals: Ability to identify changes in lifestyle to reduce recurrence of condition will improve, Ability to verbalize feelings will improve, Ability to disclose and discuss suicidal ideas, Ability to demonstrate self-control will improve, Ability to identify and develop effective coping behaviors will improve, Ability to maintain clinical measurements within normal limits will improve, Compliance with prescribed medications will improve, and Ability to identify triggers associated with substance abuse/mental health issues will improve             -- Long Term Goals: Improvement in symptoms so as ready for discharge  3. Medical Issues Being Addressed:              -- Follow-up outpatient for elevated creatinine.  Repeat metabolic profile and get UA outpatient.  -- Monitor blood pressure and will treat if consistently above 160 while inpatient.    4. Discharge Planning:              -- Social work and case management to assist with discharge planning and identification of hospital follow-up needs prior to discharge             -- Estimated LOS: 7-10 days              -- Discharge Concerns: Need to establish a safety plan; Medication compliance and effectiveness             -- Discharge Goals: Return home with outpatient referrals for mental health follow-up including medication management/psychotherapy   I certify that inpatient services furnished can reasonably be expected to improve the patient's condition.     Meryl Dare, MD 7/4/20241:42 PM

## 2023-02-23 NOTE — Progress Notes (Signed)
Pt continues to present very paranoid and suspicious on the unit

## 2023-02-24 ENCOUNTER — Encounter (HOSPITAL_COMMUNITY): Payer: Self-pay

## 2023-02-24 LAB — PROLACTIN: Prolactin: 5.7 ng/mL (ref 3.6–31.5)

## 2023-02-24 MED ORDER — OLANZAPINE 10 MG PO TBDP
10.0000 mg | ORAL_TABLET | Freq: Every day | ORAL | Status: DC
  Administered 2023-02-24 – 2023-02-25 (×2): 10 mg via ORAL
  Filled 2023-02-24 (×4): qty 1

## 2023-02-24 MED ORDER — OLANZAPINE 5 MG PO TBDP
5.0000 mg | ORAL_TABLET | Freq: Every day | ORAL | Status: DC
  Administered 2023-02-25 – 2023-02-26 (×2): 5 mg via ORAL
  Filled 2023-02-24 (×4): qty 1

## 2023-02-24 NOTE — Progress Notes (Signed)
   02/24/23 2015  Psych Admission Type (Psych Patients Only)  Admission Status Voluntary  Psychosocial Assessment  Patient Complaints Anxiety  Eye Contact Fair  Facial Expression Flat  Affect Appropriate to circumstance  Speech Logical/coherent  Interaction Assertive  Motor Activity Slow  Appearance/Hygiene Unremarkable  Behavior Characteristics Cooperative  Mood Preoccupied;Pleasant  Aggressive Behavior  Effect No apparent injury  Thought Process  Coherency Disorganized  Content Paranoia;Delusions  Delusions Paranoid  Perception Derealization  Hallucination None reported or observed  Judgment Impaired  Confusion None  Danger to Self  Current suicidal ideation? Denies  Danger to Others  Danger to Others None reported or observed

## 2023-02-24 NOTE — Plan of Care (Signed)
  Problem: Education: Goal: Emotional status will improve Outcome: Progressing Goal: Mental status will improve Outcome: Progressing   

## 2023-02-24 NOTE — [facility] Group Notes (Signed)
Spiritual care group facilitated by Chaplain Dyanne Carrel, [facility]  Group focused on topic of strength. Group members reflected on what thoughts and feelings emerge when they hear this topic. They then engaged in facilitated dialog around how strength is present in their lives. This dialog focused on representing what strength had been to them in their lives (images and patterns given) and what they saw as helpful in their life now (what they needed / wanted).  Activity drew on narrative framework.  Patient Progress: Nathan Holloway attended group and actively engaged and participated in group conversation and activities.  His verbal comments demonstrated good insight.  He became drowsy towards the end of group.

## 2023-02-24 NOTE — Progress Notes (Signed)
   02/24/23 0900  Psych Admission Type (Psych Patients Only)  Admission Status Voluntary  Psychosocial Assessment  Patient Complaints Anxiety  Eye Contact Fair  Facial Expression Flat  Affect Appropriate to circumstance  Speech Logical/coherent  Interaction Assertive  Motor Activity Slow  Appearance/Hygiene Unremarkable  Behavior Characteristics Cooperative  Mood Pleasant  Thought Process  Coherency WDL  Content Paranoia  Delusions Paranoid  Perception Derealization  Hallucination None reported or observed  Judgment Impaired  Confusion None  Danger to Self  Current suicidal ideation? Denies  Agreement Not to Harm Self Yes  Description of Agreement verbal  Danger to Others  Danger to Others None reported or observed

## 2023-02-24 NOTE — Progress Notes (Signed)
Recreation Therapy Notes  INPATIENT RECREATION THERAPY ASSESSMENT  Patient Details Name: Vitold Lapietra MRN: [id number] DOB: 1996/01/05 Today's Date: 02/24/2023       Information Obtained From: Patient  Able to Participate in Assessment/Interview: Yes  Patient Presentation: Alert  Reason for Admission (Per Patient): Other (Comments) ("having weird thoughts")  Patient Stressors: Family (his mother)  Coping Skills:   Isolation, Journal, TV, Sports, Exercise, Meditate, Music, Deep Breathing, Prayer, Talk, Avoidance, Read, Hot Bath/Shower, Other (Comment) Holiday representative)  Leisure Interests (2+):  Games - [employer], Social - Friends, Technical brewer - Other (Comment), Individual - Other (Comment) (Trading stocks/bonds; Nature walks/trails)  Frequency of Recreation/Participation: Other (Comment) (Daily)  Awareness of Community Resources:  Yes  Community Resources:  Other (Comment) (CBRS, Open mic/poetry events)  Current Use: Yes  If no, Barriers?:    Expressed Interest in [employer] Information: No  County of Residence:  [place]  Patient Main Form of Transportation: Car  Patient Strengths:  Communication; Adaptability  Patient Identified Areas of Improvement:  "have a train of thought and wants to interject but lose thought"  Patient Goal for Hospitalization:  "comply"  Current SI (including self-harm):  No  Current HI:  No  Current AVH: No  Staff Intervention Plan: Group Attendance, Collaborate with Interdisciplinary Treatment Team  Consent to Intern Participation: N/A   Hadar Elgersma-McCall, LRT,CTRS Khloei Spiker A Sharrieff Spratlin-McCall 02/24/2023, 1:38 PM

## 2023-02-24 NOTE — Group Note (Deleted)
Date:  02/24/2023 Time:  3:31 PM  Group Topic/Focus:  Diagnosis Education:   The focus of this group is to discuss the major disorders that patients maybe diagnosed with.  Group discusses the importance of knowing what one's diagnosis is so that one can understand treatment and better advocate for oneself.     Participation Level:  {BHH PARTICIPATION LEVEL:22264}  Participation Quality:  {BHH PARTICIPATION QUALITY:22265}  Affect:  {BHH AFFECT:22266}  Cognitive:  {BHH COGNITIVE:22267}  Insight: {BHH Insight2:20797}  Engagement in Group:  {BHH ENGAGEMENT IN GROUP:22268}  Modes of Intervention:  {BHH MODES OF INTERVENTION:22269}  Additional Comments:  ***  Nathan Holloway 02/24/2023, 3:31 PM  

## 2023-02-24 NOTE — BH IP Treatment Plan (Signed)
Interdisciplinary Treatment and Diagnostic Plan New  02/24/2023 Time of Session: [address] MRN: [id number]  Principal Diagnosis: Schizoaffective disorder, bipolar type [facility])  Secondary Diagnoses: Principal Problem:   Schizoaffective disorder, bipolar type (HCC)   Current Medications:  Current Facility-Administered Medications  Medication Dose Route Frequency Provider Last Rate Last Admin   acetaminophen (TYLENOL) tablet 650 mg  650 mg Oral Q6H PRN Sunday Corn, NP       alum & mag hydroxide-simeth (MAALOX/MYLANTA) 200-200-20 MG/5ML suspension 30 mL  30 mL Oral Q4H PRN Sunday Corn, NP       diphenhydrAMINE (BENADRYL) capsule 50 mg  50 mg Oral TID PRN Sunday Corn, NP       Or   diphenhydrAMINE (BENADRYL) injection 50 mg  50 mg Intramuscular TID PRN Sunday Corn, NP       divalproex (DEPAKOTE) DR tablet 250 mg  250 mg Oral BID Sunday Corn, NP   250 mg at 02/24/23 4098   haloperidol (HALDOL) tablet 5 mg  5 mg Oral TID PRN Sunday Corn, NP       Or   haloperidol lactate (HALDOL) injection 5 mg  5 mg Intramuscular TID PRN Sunday Corn, NP       LORazepam (ATIVAN) tablet 2 mg  2 mg Oral TID PRN Sunday Corn, NP       Or   LORazepam (ATIVAN) injection 2 mg  2 mg Intramuscular TID PRN Sunday Corn, NP       magnesium hydroxide (MILK OF MAGNESIA) suspension 30 mL  30 mL Oral Daily PRN Sunday Corn, NP       OLANZapine zydis (ZYPREXA) disintegrating tablet 10 mg  10 mg Oral QHS Meryl Dare, MD       [START ON 02/25/2023] OLANZapine zydis (ZYPREXA) disintegrating tablet 5 mg  5 mg Oral Daily Meryl Dare, MD       traZODone (DESYREL) tablet 50 mg  50 mg Oral QHS PRN Sunday Corn, NP   50 mg at 02/23/23 2054   PTA Medications: Medications Prior to Admission  Medication Sig Dispense Refill Last Dose   ARIPiprazole ER (ABILIFY MAINTENA) 400 MG SRER injection Inject 400 mg into the muscle every 28 (twenty-eight) days.       OLANZapine zydis (ZYPREXA) 5 MG disintegrating tablet Take 5 mg by mouth at bedtime.      traZODone (DESYREL) 100 MG tablet Take 100 mg by mouth at bedtime.      valproic acid (DEPAKENE) 250 MG/5ML solution Take 250 mg by mouth 2 (two) times daily.      hydrOXYzine (ATARAX) 25 MG tablet Take 1 tablet (25 mg total) by mouth 3 (three) times daily as needed for anxiety. 30 tablet 0     Patient Stressors: Medication change or noncompliance    Patient Strengths: Capable of independent living  Religious Affiliation   Treatment Modalities: Medication Management, Group therapy, Case management,  1 to 1 session with clinician, Psychoeducation, Recreational therapy.   Physician Treatment Plan for Primary Diagnosis: Schizoaffective disorder, bipolar type (HCC) Long Term Goal(s):   Improved Mental health  Short Term Goals:  Medication Compliance  Medication Management: Evaluate patient's response, side effects, and tolerance of medication regimen.  Therapeutic Interventions: 1 to 1 sessions, Unit Group sessions and Medication administration.  Evaluation of Outcomes: Progressing  Physician Treatment Plan for Secondary Diagnosis: Principal Problem:   Schizoaffective disorder, bipolar type (HCC)  Long Term Goal(s):  Short Term Goals:       Medication Management: Evaluate patient's response, side effects, and tolerance of medication regimen.  Therapeutic Interventions: 1 to 1 sessions, Unit Group sessions and Medication administration.  Evaluation of Outcomes: Progressing   RN Treatment Plan for Primary Diagnosis: Schizoaffective disorder, bipolar type (HCC) Long Term Goal(s): Knowledge of disease and therapeutic regimen to maintain health will improve  Short Term Goals: Ability to remain free from injury will improve, Ability to verbalize frustration and anger appropriately will improve, Ability to demonstrate self-control, Ability to participate in decision making will improve,  Ability to verbalize feelings will improve, Ability to disclose and discuss suicidal ideas, Ability to identify and develop effective coping behaviors will improve, and Compliance with prescribed medications will improve  Medication Management: RN will administer medications as ordered by provider, will assess and evaluate patient's response and provide education to patient for prescribed medication. RN will report any adverse and/or side effects to prescribing provider.  Therapeutic Interventions: 1 on 1 counseling sessions, Psychoeducation, Medication administration, Evaluate responses to treatment, Monitor vital signs and CBGs as ordered, Perform/monitor CIWA, COWS, AIMS and Fall Risk screenings as ordered, Perform wound care treatments as ordered.  Evaluation of Outcomes: Progressing   LCSW Treatment Plan for Primary Diagnosis: Schizoaffective disorder, bipolar type (HCC) Long Term Goal(s): Safe transition to appropriate next level of care at discharge, Engage patient in therapeutic group addressing interpersonal concerns.  Short Term Goals: Engage patient in aftercare planning with referrals and resources, Increase social support, Increase ability to appropriately verbalize feelings, Increase emotional regulation, Facilitate acceptance of mental health diagnosis and concerns, Facilitate patient progression through stages of change regarding substance use diagnoses and concerns, Identify triggers associated with mental health/substance abuse issues, and Increase skills for wellness and recovery  Therapeutic Interventions: Assess for all discharge needs, 1 to 1 time with Social worker, Explore available resources and support systems, Assess for adequacy in community support network, Educate family and significant other(s) on suicide prevention, Complete Psychosocial Assessment, Interpersonal group therapy.  Evaluation of Outcomes: Progressing   Progress in Treatment: Attending groups:  Yes. Participating in groups: Yes. Taking medication as prescribed: Yes. Toleration medication: Yes. Family/Significant other contact made: No, will contact:  Perley Jain ( brother ) [phone] Patient understands diagnosis: Yes. Discussing patient identified problems/goals with staff: Yes. Medical problems stabilized or resolved: Yes. Denies suicidal/homicidal ideation: Yes. Issues/concerns per patient self-inventory: Yes. Other: N/A  New problem(s) identified: No, Describe:  N/A  New Short Term/Long Term Goal(s): medication stabilization, elimination of SI thoughts, development of comprehensive mental wellness plan.   Patient Goals:  Coping Skills  Discharge Plan or Barriers: Patient recently admitted. CSW will continue to follow and assess for appropriate referrals and possible discharge planning.   Reason for Continuation of Hospitalization: Delusions  Medication stabilization  Estimated Length of Stay: 3-7 Days  Last 3 [place] Suicide Severity Risk Score: Flowsheet Row Ad[facility] (Current) [facility] from [facility]/22/2023 in [facility] from 02/14/2023 in [facility]  C-SSRS RISK CATEGORY No Risk No Risk No Risk       Last PHQ 2/9 Scores:    05/17/2022    1:00 PM 05/03/2022    3:41 PM 04/07/2022    7:28 PM  Depression screen PHQ 2/9  Decreased Interest 1 1 0  Down, Depressed, Hopeless 0 0 2  PHQ - 2 Score 1 1 2   Altered sleeping 3  2  Tired,  decreased energy 3  0  Change in appetite 3  2  Feeling bad or failure about yourself  1  0  Trouble concentrating 1  1  Moving slowly or fidgety/restless 0  2  Suicidal thoughts 0  0  PHQ-9 Score 12  9  Difficult doing work/chores Somewhat difficult  Very difficult     medication stabilization, elimination of SI thoughts, development of comprehensive mental wellness plan.   Scribe for Treatment Team: Ane Payment,  LCSW 02/24/2023 2:14 PM

## 2023-02-24 NOTE — Progress Notes (Signed)
[facility] MD Progress Note  02/24/2023 4:22 PM Nathan Holloway  MRN:  [id number] Subjective:   Nathan Holloway is a 27 year old male voluntarily admitted to [facility] from [facility] for "I did not feel safe" and symptoms consistent with his psychiatric conditions.  Past psychiatric history includes diagnosis of schizoaffective disorder, bipolar type; numerous hospitalizations; no reported history of harm or suicidal thoughts/ideation/attempts.   Nathan Holloway was at home with his mother and grandma.  He reported that he "did not feel safe" with his mother and grandma, did not trust the meds that he received due to them being different colors, and had a sensation that the skin was peeling from his forehead (although he also says that his grandma put something there).  He also describes "voodoos" which prompted him to call EMS. Nathan Holloway was recently discharged from [facility] on 02/20/2023, where he had a 7-day stay and received an injection of Abilify Maintena 400 (confirmed by our pharmacist who called [facility]).    Case was discussed in the multidisciplinary team. MAR was reviewed and patient is compliant with medications.   Psychiatric Team made the following recommendations yesterday: Continue Zyprexa 5 mg twice daily Continue Depakote 250 mg twice daily     On interview today patient reports he slept 6 hours last night, but nursing reports only 1 hour.  He reports his appetite is doing good.  He reports no SI, HI, or AVH.  He reports no Paranoia, Ideas of Reference, or other First Rank symptoms.  He reports no issues with his medications, but expresses concern about what he is taking and has been given to this point.    Principal Problem: Schizoaffective disorder, bipolar type (HCC) Diagnosis: Principal Problem:   Schizoaffective disorder, bipolar type (HCC)  Total Time spent with patient: 15 minutes  Past Psychiatric History: diagnosis of schizoaffective disorder, bipolar type; numerous  hospitalizations; no reported history of harm or suicidal thoughts/ideation/attempts   Past Medical History:  Past Medical History:  Diagnosis Date   Manic behavior (HCC)    Psychotic affective disorder (HCC) 08/30/2015   History reviewed. No pertinent surgical history. Family History:  Family History  Problem Relation Age of Onset   Healthy Mother    Family Psychiatric  History: See history and physical. Social History:  Social History   Substance and Sexual Activity  Alcohol Use No     Social History   Substance and Sexual Activity  Drug Use Not Currently   Types: Marijuana    Social History   Socioeconomic History   Marital status: Single    Spouse name: Not on file   Number of children: Not on file   Years of education: Not on file   Highest education level: Not on file  Occupational History   Not on file  Tobacco Use   Smoking status: Never   Smokeless tobacco: Never  Vaping Use   Vaping Use: Every day   Substances: Nicotine, Flavoring  Substance and Sexual Activity   Alcohol use: No   Drug use: Not Currently    Types: Marijuana   Sexual activity: Yes  Other Topics Concern   Not on file  Social History Narrative   Not on file   Social Determinants of Health   Financial Resource Strain: Low Risk  (09/18/2020)   Overall Financial Resource Strain (CARDIA)    Difficulty of Paying Living Expenses: Not very hard  Food Insecurity: No Food Insecurity (02/23/2023)   Hunger Vital Sign    Worried  About Running Out of Food in the Last Year: Never true    Ran Out of Food in the Last Year: Never true  Transportation Needs: No Transportation Needs (02/23/2023)   PRAPARE - Administrator, Civil Service (Medical): No    Lack of Transportation (Non-Medical): No  Physical Activity: Sufficiently Active (09/18/2020)   Exercise Vital Sign    Days of Exercise per Week: 4 days    Minutes of Exercise per Session: 60 min  Stress: No Stress Concern Present  (09/18/2020)   [employer] of Occupational Health - Occupational Stress Questionnaire    Feeling of Stress : Not at all  Social Connections: Socially Isolated (09/18/2020)   Social Connection and Isolation Panel [NHANES]    Frequency of Communication with Friends and Family: More than three times a week    Frequency of Social Gatherings with Friends and Family: Twice a week    Attends Religious Services: Never    Database administrator or Organizations: No    Attends Engineer, structural: Never    Marital Status: Never married   Additional Social History:                         Sleep: Poor  Appetite:  Good  Current Medications: Current Facility-Administered Medications  Medication Dose Route Frequency Provider Last Rate Last Admin   acetaminophen (TYLENOL) tablet 650 mg  650 mg Oral Q6H PRN Sunday Corn, NP       alum & mag hydroxide-simeth (MAALOX/MYLANTA) 200-200-20 MG/5ML suspension 30 mL  30 mL Oral Q4H PRN Sunday Corn, NP       diphenhydrAMINE (BENADRYL) capsule 50 mg  50 mg Oral TID PRN Sunday Corn, NP       Or   diphenhydrAMINE (BENADRYL) injection 50 mg  50 mg Intramuscular TID PRN Sunday Corn, NP       divalproex (DEPAKOTE) DR tablet 250 mg  250 mg Oral BID Sunday Corn, NP   250 mg at 02/24/23 0839   haloperidol (HALDOL) tablet 5 mg  5 mg Oral TID PRN Sunday Corn, NP       Or   haloperidol lactate (HALDOL) injection 5 mg  5 mg Intramuscular TID PRN Sunday Corn, NP       LORazepam (ATIVAN) tablet 2 mg  2 mg Oral TID PRN Sunday Corn, NP       Or   LORazepam (ATIVAN) injection 2 mg  2 mg Intramuscular TID PRN Sunday Corn, NP       magnesium hydroxide (MILK OF MAGNESIA) suspension 30 mL  30 mL Oral Daily PRN Sunday Corn, NP       OLANZapine zydis (ZYPREXA) disintegrating tablet 10 mg  10 mg Oral QHS Meryl Dare, MD       [START ON 02/25/2023] OLANZapine zydis (ZYPREXA) disintegrating tablet 5  mg  5 mg Oral Daily Meryl Dare, MD       traZODone (DESYREL) tablet 50 mg  50 mg Oral QHS PRN Sunday Corn, NP   50 mg at 02/23/23 2054    Lab Results:  Results for orders placed or performed during the hospital encounter of 02/22/23 (from the past 48 hour(s))  CBC with Differential/Platelet     Status: None   Collection Time: 02/22/23  6:34 PM  Result Value Ref Range   WBC 9.2 4.0 - 10.5 K/uL   RBC 5.30 4.22 -  5.81 MIL/uL   Hemoglobin 14.7 13.0 - 17.0 g/dL   HCT 78.4 69.6 - 29.5 %   MCV 86.8 80.0 - 100.0 fL   MCH 27.7 26.0 - 34.0 pg   MCHC 32.0 30.0 - 36.0 g/dL   RDW 28.4 13.2 - 44.0 %   Platelets 235 150 - 400 K/uL   nRBC 0.0 0.0 - 0.2 %   Neutrophils Relative % 68 %   Neutro Abs 6.3 1.7 - 7.7 K/uL   Lymphocytes Relative 27 %   Lymphs Abs 2.4 0.7 - 4.0 K/uL   Monocytes Relative 5 %   Monocytes Absolute 0.4 0.1 - 1.0 K/uL   Eosinophils Relative 0 %   Eosinophils Absolute 0.0 0.0 - 0.5 K/uL   Basophils Relative 0 %   Basophils Absolute 0.0 0.0 - 0.1 K/uL   Immature Granulocytes 0 %   Abs Immature Granulocytes 0.02 0.00 - 0.07 K/uL    Comment: Performed at [facility], [address] [place], [place] [address]  Comprehensive metabolic panel     Status: Abnormal   Collection Time: 02/22/23  6:34 PM  Result Value Ref Range   Sodium 139 135 - 145 mmol/L   Potassium 4.7 3.5 - 5.1 mmol/L   Chloride 101 98 - 111 mmol/L   CO2 29 22 - 32 mmol/L   Glucose, Bld 136 (H) 70 - 99 mg/dL    Comment: Glucose reference range applies only to samples taken after fasting for at least 8 hours.   BUN 8 6 - 20 mg/dL   Creatinine, Ser 5.36 (H) 0.61 - 1.24 mg/dL   Calcium 9.7 8.9 - 64.4 mg/dL   Total Protein 7.0 6.5 - 8.1 g/dL   Albumin 4.1 3.5 - 5.0 g/dL   AST 20 15 - 41 U/L   ALT 15 0 - 44 U/L   Alkaline Phosphatase 58 38 - 126 U/L   Total Bilirubin 0.8 0.3 - 1.2 mg/dL   GFR, Estimated >03 >47 mL/min    Comment: (NOTE) Calculated using the CKD-EPI Creatinine Equation  (2021)    Anion gap 9 5 - 15    Comment: Performed at [facility], [address], [place], [place] [address]  Magnesium     Status: None   Collection Time: 02/22/23  6:34 PM  Result Value Ref Range   Magnesium 2.0 1.7 - 2.4 mg/dL    Comment: Performed at [facility], [address] [place], [place] [address]  Valproic acid level     Status: Abnormal   Collection Time: 02/22/23  6:34 PM  Result Value Ref Range   Valproic Acid Lvl 17 (L) 50.0 - 100.0 ug/mL    Comment: Performed at [facility], [address], [place], [place] [address]  Hemoglobin A1c     Status: Abnormal   Collection Time: 02/22/23  6:37 PM  Result Value Ref Range   Hgb A1c MFr Bld 4.2 (L) 4.8 - 5.6 %    Comment: (NOTE) Pre diabetes:          5.7%-6.4%  Diabetes:              >6.4%  Glycemic control for   <7.0% adults with diabetes    Mean Plasma Glucose 73.84 mg/dL    Comment: Performed at [facility], [address], [place], [place] [address]  Ethanol     Status: None   Collection Time: 02/22/23  6:37 PM  Result Value  Ref Range   Alcohol, Ethyl (B) <10 <10 mg/dL    Comment: (NOTE) Lowest detectable limit for serum alcohol is 10 mg/dL.  For medical purposes only. Performed at [facility], [address] [place], [place] [address]   Lipid panel     Status: None   Collection Time: 02/22/23  6:37 PM  Result Value Ref Range   Cholesterol 189 0 - 200 mg/dL   Triglycerides 604 <540 mg/dL   HDL 66 >98 mg/dL   Total CHOL/HDL Ratio 2.9 RATIO   VLDL 27 0 - 40 mg/dL   LDL Cholesterol 96 0 - 99 mg/dL    Comment:        Total Cholesterol/HDL:CHD Risk Coronary Heart Disease Risk Table                     Men   Women  1/2 Average Risk   3.4   3.3  Average Risk       5.0   4.4  2 X Average Risk   9.6   7.1  3 X Average Risk  23.4   11.0        Use the calculated Patient Ratio above and the CHD Risk Table to determine the patient's CHD Risk.        ATP III  CLASSIFICATION (LDL):  <100     mg/dL   Optimal  119-147  mg/dL   Near or Above                    Optimal  130-159  mg/dL   Borderline  829-562  mg/dL   High  >130     mg/dL   Very High Performed at [facility], [address] [place], [place] [address]   TSH     Status: None   Collection Time: 02/22/23  6:37 PM  Result Value Ref Range   TSH 1.855 0.350 - 4.500 uIU/mL    Comment: Performed by a 3rd Generation assay with a functional sensitivity of <=0.01 uIU/mL. Performed at [facility], [address] [place], [place] [address]   POCT Urine Drug Screen - (I-Screen)     Status: Normal   Collection Time: 02/22/23  6:45 PM  Result Value Ref Range   POC Amphetamine UR None Detected NONE DETECTED (Cut Off Level 1000 ng/mL)   POC Secobarbital (BAR) None Detected NONE DETECTED (Cut Off Level 300 ng/mL)   POC Buprenorphine (BUP) None Detected NONE DETECTED (Cut Off Level 10 ng/mL)   POC Oxazepam (BZO) None Detected NONE DETECTED (Cut Off Level 300 ng/mL)   POC Cocaine UR None Detected NONE DETECTED (Cut Off Level 300 ng/mL)   POC Methamphetamine UR None Detected NONE DETECTED (Cut Off Level 1000 ng/mL)   POC Morphine None Detected NONE DETECTED (Cut Off Level 300 ng/mL)   POC Methadone UR None Detected NONE DETECTED (Cut Off Level 300 ng/mL)   POC Oxycodone UR None Detected NONE DETECTED (Cut Off Level 100 ng/mL)   POC Marijuana UR None Detected NONE DETECTED (Cut Off Level 50 ng/mL)    Blood Alcohol level:  Lab Results  Component Value Date   [facility] <10 02/22/2023   ETH <10 02/14/2023    Metabolic Disorder Labs: Lab Results  Component Value Date   HGBA1C 4.2 (L) 02/22/2023   MPG 73.84 02/22/2023   MPG 88 02/14/2023   Lab Results  Component Value Date   PROLACTIN 11.2 04/07/2022  PROLACTIN 3.7 (L) 07/04/2020   Lab Results  Component Value Date   CHOL 189 02/22/2023   TRIG 134 02/22/2023   HDL 66 02/22/2023   CHOLHDL 2.9 02/22/2023   VLDL 27 02/22/2023    LDLCALC 96 02/22/2023   LDLCALC 117 (H) 02/14/2023    Musculoskeletal: Strength & Muscle Tone: within normal limits Gait & Station: normal Patient leans: N/A  Psychiatric Specialty Exam:  Presentation  General Appearance:  Neat; Well Groomed  Eye Contact: Good  Speech: Normal Rate  Speech Volume: Normal  Handedness: Right   Mood and Affect  Mood: Euthymic  Affect: Appropriate   Thought Process  Thought Processes: Goal Directed; Coherent  Descriptions of Associations:Circumstantial  Orientation:Full (Time, Place and Person)  Thought Content:Logical  History of Schizophrenia/Schizoaffective disorder:Yes  Duration of Psychotic Symptoms:Greater than six months  Hallucinations:Hallucinations: None Description of Visual Hallucinations: describes grandma putting something on forehead. maybe be hallucation or true. unclear  Ideas of Reference:None  Suicidal Thoughts:Suicidal Thoughts: No  Homicidal Thoughts:Homicidal Thoughts: No   Sensorium  Memory: Recent Poor  Judgment: Poor  Insight: Poor   Executive Functions  Concentration: Fair  Attention Span: Fair  Recall: Fair  Fund of Knowledge: Fair  Language: Fair   Psychomotor Activity  Psychomotor Activity: Psychomotor Activity: Normal   Assets  Assets: Communication Skills; Desire for Improvement   Sleep  Sleep: Sleep: Poor Number of Hours of Sleep: 1    Physical Exam: Physical Exam Vitals and nursing note reviewed.  HENT:     Head: Normocephalic and atraumatic.  Pulmonary:     Effort: Pulmonary effort is normal.  Neurological:     General: No focal deficit present.     Mental Status: He is alert.    Review of Systems  Constitutional:  Negative for fever.  Cardiovascular:  Negative for chest pain and palpitations.  Gastrointestinal:  Negative for constipation, diarrhea, nausea and vomiting.  Neurological:  Negative for dizziness, weakness and headaches.    Blood pressure (!) 136/98, pulse 82, temperature 98 F (36.7 C), temperature source Oral, resp. rate 18, height 5\' 9"  (1.753 m), weight 73.5 kg, SpO2 100 %. Body mass index is 23.92 kg/m.   ASSESSMENT & PLAN  ASSESSMENT:   Diagnoses / Active Problems: Schizoaffective disorder, bipolar type (HCC)   He is no longer experiencing delusions, hallucinations.  He does have some element of disordered speech still.  He is showing signs of mania as evident by only getting 1 hour sleep, inappropriate goal-directed behavior (working on projects outside of the hospital, music, working out in the middle the night.  However he does not have distractibility, pressured speech, elevated mood/irritability, grandiosity, or flight of ideas.  Furthermore he is able to maintain a logical conversation.  It is unclear if he is psychiatrically stable or if he has really poor insight and judgment but can still maintain conversation well; based on my conversation with his mother yesterday, I believe it is the latter.      PLAN: Safety and Monitoring:             -- Involuntary admission to inpatient psychiatric unit for safety, stabilization and treatment             -- Daily contact with patient to assess and evaluate symptoms and progress in treatment             -- Patient's case to be discussed in multi-disciplinary team meeting             --  Observation Level : q15 minute checks             -- Vital signs:  q12 hours             -- Precautions: suicide, elopement, and assault   2. Psychiatric Diagnoses and Treatment:  -- modified: Zyprexa 5 mg once in the morning, Zyprexa 10 mg once at night             -- Continue Depakote 250 twice daily for mood stabilization             -- Trazodone 50 as needed for sleep --  The risks/benefits/side-effects/alternatives to this medication were discussed in detail with the patient and time was given for questions. The patient consents to medication trial.               -- Metabolic profile and EKG monitoring obtained while on an atypical antipsychotic (BMI: 24, lipid Panel: Elevated LDL otherwise normal ,HbgA1c: 4.2 QTc:)              -- Encouraged patient to participate in unit milieu and in scheduled group therapies              -- Short Term Goals: Ability to identify changes in lifestyle to reduce recurrence of condition will improve, Ability to verbalize feelings will improve, Ability to disclose and discuss suicidal ideas, Ability to demonstrate self-control will improve, Ability to identify and develop effective coping behaviors will improve, Ability to maintain clinical measurements within normal limits will improve, Compliance with prescribed medications will improve, and Ability to identify triggers associated with substance abuse/mental health issues will improve             -- Long Term Goals: Improvement in symptoms so as ready for discharge                3. Medical Issues Being Addressed:              -- Follow-up outpatient for elevated creatinine.  Repeat metabolic profile and get UA outpatient.             -- Monitor blood pressure and will treat if consistently above 160 while inpatient.    4. Discharge Planning:              -- Social work and case management to assist with discharge planning and identification of hospital follow-up needs prior to discharge             -- Estimated LOS: 7-10 days              -- Discharge Concerns: Need to establish a safety plan; Medication compliance and effectiveness             -- Discharge Goals: Return home with outpatient referrals for mental health follow-up including medication management/psychotherapy   I certify that inpatient services furnished can reasonably be expected to improve the patient's condition.   Meryl Dare, MD 02/24/2023, 4:22 PM

## 2023-02-24 NOTE — Group Note (Deleted)
Date:  02/24/2023 Time:  1:46 PM  Group Topic/Focus:  Goals Group:   The focus of this group is to help patients establish daily goals to achieve during treatment and discuss how the patient can incorporate goal setting into their daily lives to aide in recovery. Orientation:   The focus of this group is to educate the patient on the purpose and policies of crisis stabilization and provide a format to answer questions about their admission.  The group details unit policies and expectations of patients while admitted.     Participation Level:  {BHH PARTICIPATION LEVEL:22264}  Participation Quality:  {BHH PARTICIPATION QUALITY:22265}  Affect:  {BHH AFFECT:22266}  Cognitive:  {BHH COGNITIVE:22267}  Insight: {BHH Insight2:20797}  Engagement in Group:  {BHH ENGAGEMENT IN GROUP:22268}  Modes of Intervention:  {BHH MODES OF INTERVENTION:22269}  Additional Comments:  ***  Kasondra Junod W Zahra Peffley 02/24/2023, 1:46 PM  

## 2023-02-24 NOTE — Progress Notes (Signed)
Adult Psychoeducational Group Note  Date:  02/24/2023 Time:  9:52 AM  Group Topic/Focus:  Goals Group:   The focus of this group is to help patients establish daily goals to achieve during treatment and discuss how the patient can incorporate goal setting into their daily lives to aide in recovery.  Participation Level:  Active  Participation Quality:  Appropriate  Affect:  Appropriate  Cognitive:  Appropriate  Insight: Appropriate  Engagement in Group:  Engaged  Modes of Intervention:  Discussion  Additional Comments: The patient engaged in group.  Octavio Manns 02/24/2023, 9:52 AM

## 2023-02-24 NOTE — Group Note (Signed)
Date:  02/24/2023 Time:  8:56 PM  Group Topic/Focus:  Wrap-Up Group:   The focus of this group is to help patients review their daily goal of treatment and discuss progress on daily workbooks.    Participation Level:  Active  Participation Quality:  Appropriate  Affect:  Appropriate  Cognitive:  Appropriate  Insight: Appropriate  Engagement in Group:  n/a  Modes of Intervention:  n/a  Additional Comments:  Pt said he had a very good day of 10 out of 10. His goals for him is housing and discharge. His coping skills is reading Bible and socializing. He likes the [facility] in him and the confidence to pull through.  Addisson Frate E Makesha Belitz 02/24/2023, 8:56 PM

## 2023-02-24 NOTE — Group Note (Signed)
Recreation Therapy Group Note   Group Topic:Team Building  Group Date: 02/24/2023 Start Time: 1025 End Time: 1040 Facilitators: Enola Siebers-McCall, LRT,CTRS Location: [address] Dayroom   Goal Area(s) Addresses:  Patient will effectively work with peer towards shared goal.  Patient will identify skills used to make activity successful.  Patient will identify how skills used during activity can be used to reach post d/c goals.   Group Description: Straw Bridge. In teams of 3-5, patients were given 15 plastic drinking straws and an equal length of masking tape. Using the materials provided, patients were instructed to build a free standing bridge-like structure to suspend an everyday item (ex: puzzle box) off of the floor or table surface. All materials were required to be used by the team in their design. LRT facilitated post-activity discussion reviewing team process. Patients were encouraged to reflect how the skills used in this activity can be generalized to daily life post discharge.    Affect/Mood: Flat   Participation Level: Moderate   Participation Quality: Independent   Behavior: Drowsy   Speech/Thought Process: Distracted   Insight: Fair   Judgement: Fair    Modes of Intervention: STEM Activity   Patient Response to Interventions:  Receptive   Education Outcome:  Acknowledges education   Clinical Observations/Individualized Feedback: Pt attempted to complete activity with peers. Pt would fall asleep at times but would wake up focused on seeing the doctor. Pt was appropriate but focused on not missing the doctor.   Plan: Continue to engage patient in RT group sessions 2-3x/week.   Thomasina Housley-McCall, LRT,CTRS 02/24/2023 1:05 PM

## 2023-02-24 NOTE — Progress Notes (Signed)
   02/24/23 0600  15 Minute Checks  Location Bedroom  Visual Appearance Calm  Behavior Composed  Sleep (Behavioral Health Patients Only)  Calculate sleep? (Click Yes once per 24 hr at 0600 safety check) Yes  Documented sleep last 24 hours 1
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
## 2023-02-25 NOTE — Progress Notes (Signed)
   02/25/23 1800  Psych Admission Type (Psych Patients Only)  Admission Status Voluntary  Psychosocial Assessment  Patient Complaints Anxiety  Eye Contact Fair  Facial Expression Flat  Affect Appropriate to circumstance  Speech Logical/coherent  Interaction Assertive  Motor Activity Slow  Appearance/Hygiene Unremarkable  Behavior Characteristics Cooperative  Mood Pleasant  Thought Process  Coherency WDL  Content WDL  Delusions Paranoid  Perception Depersonalization  Hallucination None reported or observed  Judgment Impaired  Confusion None  Danger to Self  Current suicidal ideation? Denies  Agreement Not to Harm Self Yes  Description of Agreement verbal  Danger to Others  Danger to Others None reported or observed

## 2023-02-25 NOTE — Group Note (Signed)
Date:  02/25/2023 Time:  7:05 PM  Group Topic/Focus:  Building Self Esteem:   The Focus of this group is helping patients become aware of the effects of self-esteem on their lives, the things they and others do that enhance or undermine their self-esteem, seeing the relationship between their level of self-esteem and the choices they make and learning ways to enhance self-esteem.    Participation Level:  Active  Participation Quality:  Appropriate  Affect:  Appropriate  Cognitive:  Appropriate  Insight: Appropriate  Engagement in Group:  Engaged  Modes of Intervention:  Activity  Additional Comments:     Reymundo Poll 02/25/2023, 7:05 PM
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
## 2023-02-26 MED ORDER — OLANZAPINE 15 MG PO TBDP
15.0000 mg | ORAL_TABLET | Freq: Every day | ORAL | Status: DC
  Administered 2023-02-26: 15 mg via ORAL
  Filled 2023-02-26 (×3): qty 1

## 2023-02-26 NOTE — Plan of Care (Signed)
  Problem: Education: Goal: Knowledge of [place] [facility] Education information/materials will improve Outcome: Progressing   Problem: Education: Goal: Emotional status will improve Outcome: Progressing   Problem: Activity: Goal: Sleeping patterns will improve Outcome: Progressing   Problem: Health Behavior/Discharge Planning: Goal: Compliance with treatment plan for underlying cause of condition will improve Outcome: Progressing   Problem: Safety: Goal: Periods of time without injury will increase Outcome: Progressing

## 2023-02-26 NOTE — Plan of Care (Signed)
  Problem: Safety: Goal: Periods of time without injury will increase Outcome: Progressing   

## 2023-02-26 NOTE — Group Note (Signed)
Date:  02/26/2023 Time:  10:28 PM  Group Topic/Focus:  Wrap-Up Group:   The focus of this group is to help patients review their daily goal of treatment and discuss progress on daily workbooks.    Participation Level:  Active  Participation Quality:  Appropriate and Attentive  Affect:  Appropriate  Cognitive:  Alert and Appropriate  Insight: Appropriate  Engagement in Group:  Engaged  Modes of Intervention:  Activity and Exploration  Additional Comments:  None  Tacy Dura 02/26/2023, 10:28 PM

## 2023-02-26 NOTE — Group Note (Signed)
Date:  02/26/2023 Time:  3:22 PM  Group Topic/Focus:   Developing a Wellness Toolbox:   The focus of this group is to help patients develop a "wellness toolbox" with skills and strategies to promote recovery upon discharge. Skills and strategies discussed included eating healthy, exercise, maintaining appropriate physical health, keeping the brain stimulated with reading, puzzles, intellectual conversations, and being social.     Participation Level:  None  Participation Quality:  Attentive  Affect:  Flat  Cognitive:  Disorganized  Insight: Limited  Engagement in Group:  Lacking  Modes of Intervention:  Discussion and Education  Additional Comments:  n/a  Cherre Blanc 02/26/2023, 3:22 PM

## 2023-02-26 NOTE — Progress Notes (Addendum)
[facility] MD Progress Note  02/26/2023 2:41 PM Nathan Holloway  MRN:  [id number] Subjective:   Nathan Holloway" Carithers is a 27 year old male voluntarily admitted to [facility] from [facility] for "I did not feel safe" and symptoms consistent with his psychiatric conditions.  Past psychiatric history includes diagnosis of schizoaffective disorder, bipolar type; numerous hospitalizations; no reported history of harm or suicidal thoughts/ideation/attempts.   Nathan Holloway was at home with his mother and grandma.  He reported that he "did not feel safe" with his mother and grandma, did not trust the meds that he received due to them being different colors, and had a sensation that the skin was peeling from his forehead (although he also says that his grandma put something there).  He also describes "voodoos" which prompted him to call EMS. Nathan Holloway was recently discharged from [facility] on 02/20/2023, where he had a 7-day stay and received an injection of Abilify Maintena 400 (confirmed by our pharmacist who called [facility]).    Case was discussed in the multidisciplinary team. MAR was reviewed and patient is compliant with medications.   Psychiatric Team made the following recommendations yesterday:  Zyprexa 5 mg in the morning, Zyprexa 15 mg at night Continue Depakote 250 mg twice daily     On interview today patient reports he is doing fine.  We had a long discussion around medication.  He expressed his ideal regimen would be a single LAI without oral antipsychotic.  Based on his breakthrough schizoaffective episode while on Abilify Maintena prior to this hospitalization, I explained that he would need another antipsychotic and/or mood stabilizer alongside this LAI.  However, we discussed how he could trial Haldol D as in LAI eventually.  We discussed how he could switch from Zyprexa, which she does not like due to the metabolic side effects, to Haldol for his discharge.  He also expressed interest in Latuda, lamotrigine,  and understanding all his LAI options.  He denies SI, HI, AVH.  Sleep has improved since yesterday and appetite is still good.  Collateral: Dorene Sorrow, mother [phone]): Updated her on the hospital course of [place].  Discussed switching from Zyprexa to Haldol and she noted that he had a dystonic reaction while on Risperdal and Haldol.  She rescheduled the outpatient psychiatry appointment and will find him a therapist.  We discussed the next half after hospitalization for [facility], and she and her family have a plan for him to stay with his sister and/or Dorene Sorrow.   Principal Problem: Schizoaffective disorder, bipolar type (HCC) Diagnosis: Principal Problem:   Schizoaffective disorder, bipolar type (HCC)  Total Time spent with patient: 15 minutes  Past Psychiatric History: diagnosis of schizoaffective disorder, bipolar type; numerous hospitalizations; no reported history of harm or suicidal thoughts/ideation/attempts   Past Medical History:  Past Medical History:  Diagnosis Date   Manic behavior (HCC)    Psychotic affective disorder (HCC) 08/30/2015   History reviewed. No pertinent surgical history. Family History:  Family History  Problem Relation Age of Onset   Healthy Mother    Family Psychiatric  History: See history and physical. Social History:  Social History   Substance and Sexual Activity  Alcohol Use No     Social History   Substance and Sexual Activity  Drug Use Not Currently   Types: Marijuana    Social History   Socioeconomic History   Marital status: Single    Spouse name: Not on file   Number of children: Not on file   Years of  education: Not on file   Highest education level: Not on file  Occupational History   Not on file  Tobacco Use   Smoking status: Never   Smokeless tobacco: Never  Vaping Use   Vaping Use: Every day   Substances: Nicotine, Flavoring  Substance and Sexual Activity   Alcohol use: No   Drug use: Not Currently    Types: Marijuana    Sexual activity: Yes  Other Topics Concern   Not on file  Social History Narrative   Not on file   Social Determinants of Health   Financial Resource Strain: Low Risk  (09/18/2020)   Overall Financial Resource Strain (CARDIA)    Difficulty of Paying Living Expenses: Not very hard  Food Insecurity: No Food Insecurity (02/23/2023)   Hunger Vital Sign    Worried About Running Out of Food in the Last Year: Never true    Ran Out of Food in the Last Year: Never true  Transportation Needs: No Transportation Needs (02/23/2023)   PRAPARE - Administrator, Civil Service (Medical): No    Lack of Transportation (Non-Medical): No  Physical Activity: Sufficiently Active (09/18/2020)   Exercise Vital Sign    Days of Exercise per Week: 4 days    Minutes of Exercise per Session: 60 min  Stress: No Stress Concern Present (09/18/2020)   [employer] of Occupational Health - Occupational Stress Questionnaire    Feeling of Stress : Not at all  Social Connections: Socially Isolated (09/18/2020)   Social Connection and Isolation Panel [NHANES]    Frequency of Communication with Friends and Family: More than three times a week    Frequency of Social Gatherings with Friends and Family: Twice a week    Attends Religious Services: Never    Database administrator or Organizations: No    Attends Engineer, structural: Never    Marital Status: Never married   Additional Social History:                         Sleep: Improvement, 5 hours Appetite:  Good  Current Medications: Current Facility-Administered Medications  Medication Dose Route Frequency Provider Last Rate Last Admin   acetaminophen (TYLENOL) tablet 650 mg  650 mg Oral Q6H PRN Sunday Corn, NP       alum & mag hydroxide-simeth (MAALOX/MYLANTA) 200-200-20 MG/5ML suspension 30 mL  30 mL Oral Q4H PRN Sunday Corn, NP       diphenhydrAMINE (BENADRYL) capsule 50 mg  50 mg Oral TID PRN Sunday Corn, NP    50 mg at 02/26/23 0357   Or   diphenhydrAMINE (BENADRYL) injection 50 mg  50 mg Intramuscular TID PRN Sunday Corn, NP       divalproex (DEPAKOTE) DR tablet 250 mg  250 mg Oral BID Sunday Corn, NP   250 mg at 02/26/23 0919   haloperidol (HALDOL) tablet 5 mg  5 mg Oral TID PRN Sunday Corn, NP   5 mg at 02/26/23 0356   Or   haloperidol lactate (HALDOL) injection 5 mg  5 mg Intramuscular TID PRN Sunday Corn, NP       LORazepam (ATIVAN) tablet 2 mg  2 mg Oral TID PRN Sunday Corn, NP   2 mg at 02/26/23 0356   Or   LORazepam (ATIVAN) injection 2 mg  2 mg Intramuscular TID PRN Sunday Corn, NP  magnesium hydroxide (MILK OF MAGNESIA) suspension 30 mL  30 mL Oral Daily PRN Sunday Corn, NP       OLANZapine zydis (ZYPREXA) disintegrating tablet 15 mg  15 mg Oral QHS Mitchell, Jerrell L, DO       OLANZapine zydis (ZYPREXA) disintegrating tablet 5 mg  5 mg Oral Daily Meryl Dare, MD   5 mg at 02/26/23 0981   traZODone (DESYREL) tablet 50 mg  50 mg Oral QHS PRN Sunday Corn, NP   50 mg at 02/25/23 2123    Lab Results:  No results found for this or any previous visit (from the past 48 hour(s)).   Blood Alcohol level:  Lab Results  Component Value Date   ETH <10 02/22/2023   ETH <10 02/14/2023    Metabolic Disorder Labs: Lab Results  Component Value Date   HGBA1C 4.2 (L) 02/22/2023   MPG 73.84 02/22/2023   MPG 88 02/14/2023   Lab Results  Component Value Date   PROLACTIN 5.7 02/22/2023   PROLACTIN 11.2 04/07/2022   Lab Results  Component Value Date   CHOL 189 02/22/2023   TRIG 134 02/22/2023   HDL 66 02/22/2023   CHOLHDL 2.9 02/22/2023   VLDL 27 02/22/2023   LDLCALC 96 02/22/2023   LDLCALC 117 (H) 02/14/2023    Musculoskeletal: Strength & Muscle Tone: within normal limits Gait & Station: normal Patient leans: N/A  Psychiatric Specialty Exam:  Presentation  General Appearance:  Appropriate for Environment  Eye  Contact: Good  Speech: Normal Rate  Speech Volume: Normal  Handedness: Right   Mood and Affect  Mood: Euthymic  Affect: Appropriate (appears sedated by eyes)   Thought Process  Thought Processes: Coherent  Descriptions of Associations:Intact  Orientation:Full (Time, Place and Person)  Thought Content:Illogical  History of Schizophrenia/Schizoaffective disorder:Yes  Duration of Psychotic Symptoms:Greater than six months  Hallucinations:Hallucinations: None  Ideas of Reference:None  Suicidal Thoughts:Suicidal Thoughts: No  Homicidal Thoughts:Homicidal Thoughts: No   Sensorium  Memory: Immediate Poor; Remote Good; Recent Fair  Judgment: Fair  Insight: Fair   Art therapist  Concentration: Good  Attention Span: Good  Recall: Fair  Fund of Knowledge: Good  Language: Good   Psychomotor Activity  Psychomotor Activity: Psychomotor Activity: Normal   Assets  Assets: Communication Skills; Desire for Improvement; Financial Resources/Insurance; Housing; Intimacy; Physical Health; Social Support; Talents/Skills; Transportation; Vocational/Educational   Sleep  Sleep: Sleep: Fair (improvement) Number of Hours of Sleep: 5    Physical Exam: Physical Exam Vitals and nursing note reviewed.  HENT:     Head: Normocephalic and atraumatic.  Pulmonary:     Effort: Pulmonary effort is normal.  Neurological:     General: No focal deficit present.     Mental Status: He is alert.    Review of Systems  Constitutional:  Negative for fever.  Cardiovascular:  Negative for chest pain and palpitations.  Gastrointestinal:  Negative for constipation, diarrhea, nausea and vomiting.  Neurological:  Negative for dizziness, weakness and headaches.   Blood pressure 119/72, pulse 98, temperature 98 F (36.7 C), temperature source Oral, resp. rate 18, height 5\' 9"  (1.753 m), weight 73.5 kg, SpO2 100 %. Body mass index is 23.92  kg/m.   ASSESSMENT & PLAN  ASSESSMENT:   Diagnoses / Active Problems: Schizoaffective disorder, bipolar type (HCC)   He is no longer experiencing delusions, hallucinations.  He does have some element of disordered speech still.  He is showing signs of mania as evident by only getting 1  hour sleep, inappropriate goal-directed behavior (working on projects outside of the hospital, music, working out in the middle the night.  However he does not have distractibility, pressured speech, elevated mood/irritability, grandiosity, or flight of ideas.  Furthermore he is able to maintain a logical conversation.  It is unclear if he is psychiatrically stable or if he has really poor insight and judgment but can still maintain conversation well; based on my conversation with his mother yesterday, I believe it is the latter.  7/7 based on a long conversation surrounding medications, the ideal psychotic for him would be Haldol (or Risperdal) as he could switch to LAI for these.  However his mom discusses a history of a severe dystonic reaction on a regimen that included Risperdal and Haldol (she is relatively confident about this based on memory but notes that she needs to clarify the documentation).  We are now also considering Prolixin/perphenazine.   PLAN: Safety and Monitoring:             -- Involuntary admission to inpatient psychiatric unit for safety, stabilization and treatment             -- Daily contact with patient to assess and evaluate symptoms and progress in treatment             -- Patient's case to be discussed in multi-disciplinary team meeting             -- Observation Level : q15 minute checks             -- Vital signs:  q12 hours             -- Precautions: suicide, elopement, and assault   2. Psychiatric Diagnoses and Treatment:  --Continue on Zyprexa.  5 in the morning and 15 at night.   -- Continue Depakote 250 twice daily for mood stabilization             -- Trazodone 50 as  needed for sleep --  The risks/benefits/side-effects/alternatives to this medication were discussed in detail with the patient and time was given for questions. The patient consents to medication trial.              -- Metabolic profile and EKG monitoring obtained while on an atypical antipsychotic (BMI: 24, lipid Panel: Elevated LDL otherwise normal ,HbgA1c: 4.2 QTc:350)              -- Encouraged patient to participate in unit milieu and in scheduled group therapies              -- Short Term Goals: Ability to identify changes in lifestyle to reduce recurrence of condition will improve, Ability to verbalize feelings will improve, Ability to disclose and discuss suicidal ideas, Ability to demonstrate self-control will improve, Ability to identify and develop effective coping behaviors will improve, Ability to maintain clinical measurements within normal limits will improve, Compliance with prescribed medications will improve, and Ability to identify triggers associated with substance abuse/mental health issues will improve             -- Long Term Goals: Improvement in symptoms so as ready for discharge                3. Medical Issues Being Addressed:              -- Follow-up outpatient for elevated creatinine.  Repeat metabolic profile and get UA outpatient.             --  Monitor blood pressure and will treat if consistently above 160 while inpatient.    4. Discharge Planning:              -- Social work and case management to assist with discharge planning and identification of hospital follow-up needs prior to discharge             -- Estimated LOS: 7-10 days              -- Discharge Concerns: Need to establish a safety plan; Medication compliance and effectiveness             -- Discharge Goals: Return home with outpatient referrals for mental health follow-up including medication management/psychotherapy   I certify that inpatient services furnished can reasonably be expected to improve  the patient's condition.   Meryl Dare, MD 02/26/2023, 2:41 PM

## 2023-02-26 NOTE — Progress Notes (Signed)
   02/26/23 2200  Psych Admission Type (Psych Patients Only)  Admission Status Voluntary  Psychosocial Assessment  Patient Complaints Anxiety  Eye Contact Fair  Facial Expression Flat  Affect Appropriate to circumstance  Speech Logical/coherent  Interaction Assertive  Motor Activity Slow  Appearance/Hygiene Unremarkable  Behavior Characteristics Cooperative  Mood Anxious  Thought Process  Coherency WDL  Content WDL  Delusions Paranoid  Perception Derealization  Hallucination None reported or observed  Judgment Impaired  Confusion None  Danger to Self  Current suicidal ideation? Denies  Agreement Not to Harm Self Yes  Description of Agreement Verbal  Danger to Others  Danger to Others None reported or observed

## 2023-02-26 NOTE — Progress Notes (Signed)
   02/26/23 1610  15 Minute Checks  Location Bedroom  Visual Appearance Calm  Behavior Sleeping  Sleep (Behavioral Health Patients Only)  Calculate sleep? (Click Yes once per 24 hr at 0600 safety check) Yes  Documented sleep last 24 hours 4.75

## 2023-02-26 NOTE — Progress Notes (Signed)
MHT reported to this writer that patient has been awake, up and pacing in his room. Patient observed going through his belongings in his room. Patient stated he was looking for a book to read. Patient received agitation protocol per [facility]. No distress noted.

## 2023-02-26 NOTE — Plan of Care (Signed)
  Problem: Education: Goal: Knowledge of [place] [facility] Education information/materials will improve Outcome: Progressing   Problem: Education: Goal: Mental status will improve Outcome: Progressing   Problem: Coping: Goal: Ability to verbalize frustrations and anger appropriately will improve Outcome: Progressing   Problem: Safety: Goal: Periods of time without injury will increase Outcome: Progressing   

## 2023-02-26 NOTE — [facility] Group Notes (Signed)
The focus of this group is to help patients establish daily goals to achieve during treatment and discuss how the patient can incorporate goal setting into their daily lives to aide in recovery.  Pt did not attend group 

## 2023-02-26 NOTE — Progress Notes (Signed)
   02/25/23 2145  Psych Admission Type (Psych Patients Only)  Admission Status Voluntary  Psychosocial Assessment  Patient Complaints Anxiety  Eye Contact Fair  Facial Expression Flat  Affect Appropriate to circumstance  Speech Logical/coherent  Interaction Assertive  Motor Activity Slow  Appearance/Hygiene Unremarkable  Behavior Characteristics Cooperative  Mood Anxious  Thought Process  Coherency WDL  Content WDL  Delusions Paranoid  Perception Depersonalization  Hallucination None reported or observed  Judgment Impaired  Confusion None  Danger to Self  Current suicidal ideation? Denies  Agreement Not to Harm Self Yes  Description of Agreement Verbal  Danger to Others  Danger to Others None reported or observed

## 2023-02-27 MED ORDER — OLANZAPINE 10 MG PO TBDP
20.0000 mg | ORAL_TABLET | Freq: Every day | ORAL | Status: DC
  Administered 2023-02-27: 20 mg via ORAL
  Filled 2023-02-27 (×2): qty 2

## 2023-02-27 NOTE — Progress Notes (Signed)
   02/27/23 0900  Psych Admission Type (Psych Patients Only)  Admission Status Voluntary  Psychosocial Assessment  Patient Complaints Anxiety  Eye Contact Fair  Facial Expression Animated  Affect Appropriate to circumstance  Speech Logical/coherent  Interaction Assertive  Motor Activity Slow  Appearance/Hygiene Unremarkable  Behavior Characteristics Cooperative  Mood Pleasant  Thought Process  Coherency WDL  Content WDL  Delusions Paranoid  Perception WDL  Hallucination None reported or observed  Judgment Impaired  Confusion None  Danger to Self  Current suicidal ideation? Denies  Danger to Others  Danger to Others None reported or observed

## 2023-02-27 NOTE — Progress Notes (Signed)
   02/27/23 0630  15 Minute Checks  Location Dayroom  Visual Appearance Calm  Behavior Composed  Sleep (Behavioral Health Patients Only)  Calculate sleep? (Click Yes once per 24 hr at 0600 safety check) Yes  Documented sleep last 24 hours 7.25

## 2023-02-27 NOTE — Progress Notes (Signed)
[facility] MD Progress Note  02/27/2023 5:06 PM Nathan Holloway  MRN:  [id number] Subjective:   Nathan Holloway" [place] is a 27 year old male voluntarily admitted to [facility] from [facility] for "I did not feel safe" and symptoms consistent with his psychiatric conditions.  Past psychiatric history includes diagnosis of schizoaffective disorder, bipolar type; numerous hospitalizations; no reported history of harm or suicidal thoughts/ideation/attempts.   Nathan Holloway was at home with his mother and grandma.  He reported that he "did not feel safe" with his mother and grandma, did not trust the meds that he received due to them being different colors, and had a sensation that the skin was peeling from his forehead (although he also says that his grandma put something there).  He also describes "voodoos" which prompted him to call EMS. Nathan Holloway was recently discharged from [facility] on 02/20/2023, where he had a 7-day stay and received an injection of Abilify Maintena 400 (confirmed by our pharmacist who called [facility]).    Case was discussed in the multidisciplinary team. MAR was reviewed and patient is compliant with medications.   Psychiatric Team made the following recommendations yesterday:  Zyprexa 5 mg in the morning, Zyprexa 15 mg at night Continue Depakote 250 mg twice daily     On interview today patient reports he is doing fine.  We discussed how he cannot restart Haldol due to his previous dystonic reaction.  He is agreeable to continuing olanzapine until he has an outpatient psychiatrist who can optimize his regimen.  He also feels that he is "sleepy" after taking the morning dose of olanzapine, so we agreed to make his dose 20 at night and to remove the morning dose.  No other concerns noted.  He reported getting 8 hours of sleep last night.  His appetite is good.  He denies SI, HI, AVH.  He discussed that he will discharge to his mom and grandma's house.    Principal Problem: Schizoaffective disorder,  bipolar type (HCC) Diagnosis: Principal Problem:   Schizoaffective disorder, bipolar type (HCC)  Total Time spent with patient: 15 minutes  Past Psychiatric History: diagnosis of schizoaffective disorder, bipolar type; numerous hospitalizations; no reported history of harm or suicidal thoughts/ideation/attempts   Past Medical History:  Past Medical History:  Diagnosis Date   Manic behavior (HCC)    Psychotic affective disorder (HCC) 08/30/2015   History reviewed. No pertinent surgical history. Family History:  Family History  Problem Relation Age of Onset   Healthy Mother    Family Psychiatric  History: See history and physical. Social History:  Social History   Substance and Sexual Activity  Alcohol Use No     Social History   Substance and Sexual Activity  Drug Use Not Currently   Types: Marijuana    Social History   Socioeconomic History   Marital status: Single    Spouse name: Not on file   Number of children: Not on file   Years of education: Not on file   Highest education level: Not on file  Occupational History   Not on file  Tobacco Use   Smoking status: Never   Smokeless tobacco: Never  Vaping Use   Vaping Use: Every day   Substances: Nicotine, Flavoring  Substance and Sexual Activity   Alcohol use: No   Drug use: Not Currently    Types: Marijuana   Sexual activity: Yes  Other Topics Concern   Not on file  Social History Narrative   Not on file  Social Determinants of Holloway   Financial Resource Strain: Low Risk  (09/18/2020)   Overall Financial Resource Strain (CARDIA)    Difficulty of Paying Living Expenses: Not very hard  Food Insecurity: No Food Insecurity (02/23/2023)   Hunger Vital Sign    Worried About Running Out of Food in the Last Year: Never true    Ran Out of Food in the Last Year: Never true  Transportation Needs: No Transportation Needs (02/23/2023)   PRAPARE - Administrator, Civil Service (Medical): No    Lack of  Transportation (Non-Medical): No  Physical Activity: Sufficiently Active (09/18/2020)   Exercise Vital Sign    Days of Exercise per Week: 4 days    Minutes of Exercise per Session: 60 min  Stress: No Stress Concern Present (09/18/2020)   Nathan Holloway - Occupational Stress Questionnaire    Feeling of Stress : Not at all  Social Connections: Socially Isolated (09/18/2020)   Social Connection and Isolation Panel [NHANES]    Frequency of Communication with Friends and Family: More than three times a week    Frequency of Social Gatherings with Friends and Family: Twice a week    Attends Religious Services: Never    Database administrator or Organizations: No    Attends Engineer, structural: Never    Marital Status: Never married   Additional Social History:                         Sleep: Improvement, 8 hours Appetite:  Good  Current Medications: Current Facility-Administered Medications  Medication Dose Route Frequency Provider Last Rate Last Admin   acetaminophen (TYLENOL) tablet 650 mg  650 mg Oral Q6H PRN Sunday Corn, NP       alum & mag hydroxide-simeth (MAALOX/MYLANTA) 200-200-20 MG/5ML suspension 30 mL  30 mL Oral Q4H PRN Sunday Corn, NP       diphenhydrAMINE (BENADRYL) capsule 50 mg  50 mg Oral TID PRN Sunday Corn, NP   50 mg at 02/26/23 0357   Or   diphenhydrAMINE (BENADRYL) injection 50 mg  50 mg Intramuscular TID PRN Sunday Corn, NP       divalproex (DEPAKOTE) DR tablet 250 mg  250 mg Oral BID Sunday Corn, NP   250 mg at 02/27/23 0843   LORazepam (ATIVAN) tablet 2 mg  2 mg Oral TID PRN Sunday Corn, NP   2 mg at 02/26/23 0356   Or   LORazepam (ATIVAN) injection 2 mg  2 mg Intramuscular TID PRN Sunday Corn, NP       magnesium hydroxide (MILK OF MAGNESIA) suspension 30 mL  30 mL Oral Daily PRN Sunday Corn, NP       OLANZapine zydis (ZYPREXA) disintegrating tablet 20 mg  20 mg Oral QHS  Massengill, Nathan, MD       traZODone (DESYREL) tablet 50 mg  50 mg Oral QHS PRN Sunday Corn, NP   50 mg at 02/26/23 2128    Lab Results:  No results found for this or any previous visit (from the past 48 hour(s)).   Blood Alcohol level:  Lab Results  Component Value Date   [facility] <10 02/22/2023   ETH <10 02/14/2023    Metabolic Disorder Labs: Lab Results  Component Value Date   HGBA1C 4.2 (L) 02/22/2023   MPG 73.84 02/22/2023   MPG 88 02/14/2023   Lab Results  Component Value Date   PROLACTIN 5.7 02/22/2023   PROLACTIN 11.2 04/07/2022   Lab Results  Component Value Date   CHOL 189 02/22/2023   TRIG 134 02/22/2023   HDL 66 02/22/2023   CHOLHDL 2.9 02/22/2023   VLDL 27 02/22/2023   LDLCALC 96 02/22/2023   LDLCALC 117 (H) 02/14/2023    Musculoskeletal: Strength & Muscle Tone: within normal limits Gait & Station: normal Patient leans: N/A  Psychiatric Specialty Exam:  Presentation  General Appearance:  Appropriate for Environment  Eye Contact: Good  Speech: Normal Rate  Speech Volume: Normal  Handedness: Right   Mood and Affect  Mood: Euthymic  Affect: Appropriate   Thought Process  Thought Processes: Coherent  Descriptions of Associations:Intact  Orientation:Full (Time, Place and Person)  Thought Content:Logical  History of Schizophrenia/Schizoaffective disorder:Yes  Duration of Psychotic Symptoms:Greater than six months  Hallucinations:Hallucinations: None  Ideas of Reference:None  Suicidal Thoughts:Suicidal Thoughts: No  Homicidal Thoughts:Homicidal Thoughts: No   Sensorium  Memory: Immediate Fair; Recent Fair; Remote Fair  Judgment: Good  Insight: Fair   Executive Functions  Concentration: Good  Attention Span: Good  Recall: Good  Fund of Knowledge: Good  Language: Good   Psychomotor Activity  Psychomotor Activity: Psychomotor Activity: Normal   Assets  Assets: Communication Skills;  Desire for Improvement; Housing; Social Support   Sleep  Sleep: Sleep: Good Number of Hours of Sleep: 8    Physical Exam: Physical Exam Vitals and nursing note reviewed.  HENT:     Head: Normocephalic and atraumatic.  Pulmonary:     Effort: Pulmonary effort is normal.  Neurological:     General: No focal deficit present.     Mental Status: He is alert.    Review of Systems  Constitutional:  Negative for fever.  Cardiovascular:  Negative for chest pain and palpitations.  Gastrointestinal:  Negative for constipation, diarrhea, nausea and vomiting.  Neurological:  Negative for dizziness, weakness and headaches.   Blood pressure (!) 106/58, pulse 96, temperature 98.1 F (36.7 C), temperature source Oral, resp. rate 16, height 5\' 9"  (1.753 m), weight 73.5 kg, SpO2 97 %. Body mass index is 23.92 kg/m.   ASSESSMENT & PLAN  ASSESSMENT:   Diagnoses / Active Problems: Schizoaffective disorder, bipolar type (HCC)   7/6 and earlier: He is no longer experiencing delusions, hallucinations.  He does have some element of disordered speech still.  He is showing signs of mania as evident by only getting 1 hour sleep, inappropriate goal-directed behavior (working on projects outside of the hospital, music, working out in the middle the night.  However he does not have distractibility, pressured speech, elevated mood/irritability, grandiosity, or flight of ideas.  Furthermore he is able to maintain a logical conversation.  It is unclear if he is psychiatrically stable or if he has really poor insight and judgment but can still maintain conversation well; based on my conversation with his mother yesterday, I believe it is the latter.  7/7: based on a long conversation surrounding medications, the ideal psychotic for him would be Haldol (or Risperdal) as he could switch to LAI for these.  However his mom discusses a history of a severe dystonic reaction on a regimen that included Risperdal and  Haldol (she is relatively confident about this based on memory but notes that she needs to clarify the documentation).  We are now also considering Prolixin/perphenazine.  7/8: He continues to have no symptoms that are present on admission.  We discussed discharge for tomorrow and  made medication regimen changes which the patient was agreeable to.   PLAN: Safety and Monitoring:             -- Involuntary admission to inpatient psychiatric unit for safety, stabilization and treatment             -- Daily contact with patient to assess and evaluate symptoms and progress in treatment             -- Patient's case to be discussed in multi-disciplinary team meeting             -- Observation Level : q15 minute checks             -- Vital signs:  q12 hours             -- Precautions: suicide, elopement, and assault   2. Psychiatric Diagnoses and Treatment:  -- Discontinue morning dose.  Olanzapine 20 once at night. -- Continue Depakote 250 twice daily for mood stabilization             -- Trazodone 50 as needed for sleep --  The risks/benefits/side-effects/alternatives to this medication were discussed in detail with the patient and time was given for questions. The patient consents to medication trial.              -- Metabolic profile and EKG monitoring obtained while on an atypical antipsychotic (BMI: 24, lipid Panel: Elevated LDL otherwise normal ,HbgA1c: 4.2 QTc:350)              -- Encouraged patient to participate in unit milieu and in scheduled group therapies              -- Short Term Goals: Ability to identify changes in lifestyle to reduce recurrence of condition will improve, Ability to verbalize feelings will improve, Ability to disclose and discuss suicidal ideas, Ability to demonstrate self-control will improve, Ability to identify and develop effective coping behaviors will improve, Ability to maintain clinical measurements within normal limits will improve, Compliance with prescribed  medications will improve, and Ability to identify triggers associated with substance abuse/mental Holloway issues will improve             -- Long Term Goals: Improvement in symptoms so as ready for discharge                3. Medical Issues Being Addressed:              -- Follow-up outpatient for elevated creatinine.  Repeat metabolic profile and get UA outpatient.             -- Monitor blood pressure and will treat if consistently above 160 while inpatient.    4. Discharge Planning:              -- Social work and case management to assist with discharge planning and identification of hospital follow-up needs prior to discharge             -- Estimated LOS: 7-10 days              -- Discharge Concerns: Need to establish a safety plan; Medication compliance and effectiveness             -- Discharge Goals: Return home with outpatient referrals for mental Holloway follow-up including medication management/psychotherapy   I certify that inpatient services furnished can reasonably be expected to improve the patient's condition.   Meryl Dare, MD 02/27/2023, 5:06 PM

## 2023-02-27 NOTE — Progress Notes (Signed)
Patient resting in bed, respirations are even and unlabored. No behavioral issues noted, routine checks maintained.

## 2023-02-27 NOTE — BHH Group Notes (Signed)
Spiritual care group on loss and grief facilitated by Chaplain Dyanne Carrel, [facility]  Group goal: Support / education around grief.  Identifying grief patterns, feelings / responses to grief, identifying behaviors that may emerge from grief responses, identifying when one may call on an ally or coping skill.  Group Description:  Following introductions and group rules, group opened with psycho-social ed. Group members engaged in facilitated dialog around topic of loss, with particular support around experiences of loss in their lives. Group Identified types of loss (relationships / self / things) and identified patterns, circumstances, and changes that precipitate losses. Reflected on thoughts / feelings around loss, normalized grief responses, and recognized variety in grief experience.  Group engaged in visual explorer activity, identifying elements of grief journey as well as needs / ways of caring for themselves. Group reflected on Worden's tasks of grief.  Group facilitation drew on brief cognitive behavioral, narrative, and Adlerian modalities  Patient progress: Pt attended group and actively engaged and participated in group conversation and activities. He shared about how he feels safety by imagining his father by his side.  He shared that his father died when he was younger.

## 2023-02-27 NOTE — Group Note (Signed)
Recreation Therapy Group Note   Group Topic:Team Building  Group Date: 02/27/2023 Start Time: 0930 End Time: 1005 Facilitators: Makiyah Zentz-McCall, LRT,CTRS Location: [address] Day[address]   Goal Area(s) Addresses:  Patient will effectively work with peer towards shared goal.  Patient will identify skills used to make activity successful.  Patient will identify how skills used during activity can be applied to reach post d/c goals.   Group Description: [employer]. In teams of 5-6, patients were given 11 craft pipe cleaners. Using the materials provided, patients were instructed to compete again the opposing team(s) to build the tallest free-standing structure from floor level. The activity was timed; difficulty increased by Clinical research associate as Production designer, theatre/television/film continued.  Systematically resources were removed with additional directions for example, placing one arm behind their back, working in silence, and shape stipulations. LRT facilitated post-activity discussion reviewing team processes and necessary communication skills involved in completion. Patients were encouraged to reflect how the skills utilized, or not utilized, in this activity can be incorporated to positively impact support systems post discharge.   Affect/Mood: Appropriate   Participation Level: Engaged   Participation Quality: Independent   Behavior: Appropriate   Speech/Thought Process: Focused   Insight: Good   Judgement: Good   Modes of Intervention: STEM Activity   Patient Response to Interventions:  Engaged   Education Outcome:  Acknowledges education   Clinical Observations/Individualized Feedback: Pt was bright and worked well with peers in completing the activity.     Plan: Continue to engage patient in RT group sessions 2-3x/week.   Mckinnon Glick-McCall, LRT,CTRS 02/27/2023 12:06 PM

## 2023-02-27 NOTE — BHH Group Notes (Signed)
Adult Psychoeducational Group Note  Date:  02/27/2023 Time:  10:27 PM  Group Topic/Focus:  Wrap-Up Group:   The focus of this group is to help patients review their daily goal of treatment and discuss progress on daily workbooks.  Participation Level:  Active  Participation Quality:  Attentive  Affect:  Flat  Cognitive:  Delusional  Insight: Lacking  Engagement in Group:  Engaged  Modes of Intervention:  Discussion and Support  Additional Comments:  Pt attended group 20 minutes after starting. Pt was attentive and when the opportunity to share was presented he shared about some of his experiences.  Maura Crandall Cassandra 02/27/2023, 10:27 PM

## 2023-02-27 NOTE — BHH Group Notes (Signed)
Adult Psychoeducational Group Note  Date:  02/27/2023 Time:  11:24 AM  Group Topic/Focus:  Goals Group:   The focus of this group is to help patients establish daily goals to achieve during treatment and discuss how the patient can incorporate goal setting into their daily lives to aide in recovery.  Participation Level:  Active  Participation Quality:  Appropriate  Affect:  Appropriate  Cognitive:  Appropriate  Insight: Good  Engagement in Group:  Engaged  Modes of Intervention:  Discussion  Additional Comments:    Lucilla Edin 02/27/2023, 11:24 AM

## 2023-02-27 NOTE — Group Note (Signed)
Date:  02/27/2023 Time:  9:19 PM  Group Topic/Focus:  Wrap-Up Group:   The focus of this group is to help patients review their daily goal of treatment and discuss progress on daily workbooks.    Participation Level:  Active  Participation Quality:  Attentive  Affect:  Appropriate  Cognitive:  Appropriate  Insight: Good  Engagement in Group:  Engaged  Modes of Intervention:  Discussion  Additional Comments:  Pt rated his day a 10 out of 10. Happy he spent some time outside in  the sun. 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

## 2023-02-27 NOTE — BHH Group Notes (Signed)
Adult Psychoeducational Group Note  Date:  02/27/2023 Time:  4:17 PM  Group Topic/Focus:  Coping With Mental Health Crisis:   The purpose of this group is to help patients identify strategies for coping with mental health crisis.  Group discusses possible causes of crisis and ways to manage them effectively.  Participation Level:  Active  Participation Quality:  Appropriate  Affect:  Appropriate  Cognitive:  Appropriate  Insight: Good  Engagement in Group:  Engaged  Modes of Intervention:  Discussion  Additional Comments:    Lucilla Edin 02/27/2023, 4:17 PM

## 2023-02-27 NOTE — Group Note (Signed)
Occupational Therapy Group Note  Group Topic:Coping Skills  Group Date: 02/27/2023 Start Time: 1430 End Time: 1505 Facilitators: Chael Urenda G, OT   Group Description: Group encouraged increased engagement and participation through discussion and activity focused on "Coping Ahead." Patients were split up into teams and selected a card from a stack of positive coping strategies. Patients were instructed to act out/charade the coping skill for other peers to guess and receive points for their team. Discussion followed with a focus on identifying additional positive coping strategies and patients shared how they were going to cope ahead over the weekend while continuing hospitalization stay.  Therapeutic Goal(s): Identify positive vs negative coping strategies. Identify coping skills to be used during hospitalization vs coping skills outside of hospital/at home Increase participation in therapeutic group environment and promote engagement in treatment   Participation Level: Engaged   Participation Quality: Independent   Behavior: Appropriate   Speech/Thought Process: Relevant   Affect/Mood: Appropriate   Insight: Fair   Judgement: Fair      Modes of Intervention: Education  Patient Response to Interventions:  Attentive   Plan: Continue to engage patient in OT groups 2 - 3x/week.  02/27/2023  Lichelle Viets G Othon Guardia, OT Crysta Gulick, OT   

## 2023-02-28 DIAGNOSIS — F250 Schizoaffective disorder, bipolar type: Principal | ICD-10-CM

## 2023-02-28 LAB — COMPREHENSIVE METABOLIC PANEL
ALT: 17 U/L (ref 0–44)
AST: 18 U/L (ref 15–41)
Albumin: 3.9 g/dL (ref 3.5–5.0)
Alkaline Phosphatase: 53 U/L (ref 38–126)
Anion gap: 8 (ref 5–15)
BUN: 11 mg/dL (ref 6–20)
CO2: 30 mmol/L (ref 22–32)
Calcium: 9.1 mg/dL (ref 8.9–10.3)
Chloride: 104 mmol/L (ref 98–111)
Creatinine, Ser: 1.34 mg/dL — ABNORMAL HIGH (ref 0.61–1.24)
GFR, Estimated: >60 mL/min (ref >60)
Glucose, Bld: 90 mg/dL (ref 70–99)
Potassium: 4.1 mmol/L (ref 3.5–5.1)
Sodium: 142 mmol/L (ref 135–145)
Total Bilirubin: 0.6 mg/dL (ref 0.3–1.2)
Total Protein: 6.9 g/dL (ref 6.5–8.1)

## 2023-02-28 LAB — VALPROIC ACID LEVEL: Valproic Acid Lvl: 25 ug/mL — ABNORMAL LOW (ref 50.0–100.0)

## 2023-02-28 MED ORDER — HYDROXYZINE HCL 25 MG PO TABS
25.0000 mg | ORAL_TABLET | Freq: Three times a day (TID) | ORAL | 0 refills | Status: DC | PRN
Start: 2023-02-28 — End: 2023-12-09

## 2023-02-28 MED ORDER — TRAZODONE HCL 50 MG PO TABS: 50.0000 mg | ORAL_TABLET | Freq: Every evening | ORAL | 0 refills | Status: DC | PRN

## 2023-02-28 MED ORDER — OLANZAPINE 20 MG PO TBDP: 20.0000 mg | ORAL_TABLET | Freq: Every day | ORAL | 0 refills | Status: DC

## 2023-02-28 MED ORDER — DIVALPROEX SODIUM 250 MG PO DR TAB: 250.0000 mg | DELAYED_RELEASE_TABLET | Freq: Two times a day (BID) | ORAL | 0 refills | Status: DC

## 2023-02-28 MED ORDER — HYDROXYZINE HCL 25 MG PO TABS
25.0000 mg | ORAL_TABLET | ORAL | Status: AC
  Administered 2023-02-28: 25 mg via ORAL
  Filled 2023-02-28 (×2): qty 1

## 2023-02-28 NOTE — Discharge Instructions (Signed)
-  Follow-up with your outpatient psychiatric provider -instructions on appointment date, time, and address (location) are provided to you in discharge paperwork.  -Take your psychiatric medications as prescribed at discharge - instructions are provided to you in the discharge paperwork  -Follow-up with outpatient primary care doctor and other specialists -for management of preventative medicine and any chronic medical disease.  -Recommend abstinence from alcohol, tobacco, and other illicit drug use at discharge.   -If your psychiatric symptoms recur, worsen, or if you have side effects to your psychiatric medications, call your outpatient psychiatric provider, 911, [phone] or go to the nearest emergency department.  -If suicidal thoughts occur, call your outpatient psychiatric provider, 911, [phone] or go to the nearest emergency department.  Naloxone (Narcan) can help reverse an overdose when given to the victim quickly.  [place] offers free naloxone kits and instructions/training on its use.  Add naloxone to your first aid kit and you can help save a life.   Pick up your free kit at the following locations:   [place]:  [facility], [address] [place] [address] ([phone]) [facility] [address] [place] [id number] ([phone]) [facility] [address]  [place] [address]  [place]: [facility] [address] [place] [address] ([phone]) [facility] [address] [place] [address] ([phone])  

## 2023-02-28 NOTE — Progress Notes (Signed)
   02/27/23 2100  Psych Admission Type (Psych Patients Only)  Admission Status Voluntary  Psychosocial Assessment  Patient Complaints Anxiety  Eye Contact Fair  Facial Expression Animated  Affect Appropriate to circumstance  Speech Logical/coherent  Interaction Assertive  Motor Activity Slow  Appearance/Hygiene Unremarkable  Behavior Characteristics Cooperative  Mood Pleasant  Thought Process  Coherency WDL  Content WDL  Delusions Paranoid  Perception WDL  Hallucination None reported or observed  Judgment Impaired  Confusion None  Danger to Self  Current suicidal ideation? Denies  Agreement Not to Harm Self Yes  Description of Agreement Verbal  Danger to Others  Danger to Others None reported or observed

## 2023-02-28 NOTE — Progress Notes (Signed)
  [facility] Adult Case Management Discharge Plan :  Will you be returning to the same living situation after discharge:  Yes,  Pt will be returning back home with mom  At discharge, do you have transportation home?: Yes,  Mom will pick patient up around 11:30 am  Do you have the ability to pay for your medications: Yes,  BC[employer] Non-participating   Release of information consent forms completed and in the chart;  Patient's signature needed at discharge.  Patient to Follow up at:  Follow-up Information     Nathan Holloway, Nathan Holloway. Go on 03/22/2023.   Why: You have an appointment for medication management services on 03/22/23 at 2:00 pm.  This appointment will be held in person. Contact information: [address] [place] [address] [phone]         [facility]. Schedule an appointment as soon as possible for a visit.   Why: Please contact this provider personally (per provider request) to schedule an appointment for therapy services as soon as possible. Contact information: [address] Nathan Holloway [place], [place] [address] [phone]                 Next level of care provider has access to [facility] Link:no  Safety Planning and Suicide Prevention discussed: Nathan Holloway,  Nestor Wieneke [phone]     Has patient been referred to the Quitline?: Patient does not use tobacco/nicotine products; Pt does smoke marijuana   Patient has been referred for addiction treatment: No known substance use disorder.  Isabella Bowens, LCSWA 02/28/2023, 10:33 AM

## 2023-02-28 NOTE — [facility] Suicide Risk Assessment (Addendum)
[facility] INPATIENT:  Family/Significant Other Suicide Prevention Education  Suicide Prevention Education:  Education Completed; Tandon Linville [phone] ( Mom),  (name of family member/significant other) has been identified by the patient as the family member/significant other with whom the patient will be residing, and identified as the person(s) who will aid the patient in the event of a mental health crisis (suicidal ideations/suicide attempt).  With written consent from the patient, the family member/significant other has been provided the following suicide prevention education, prior to the and/or following the discharge of the patient.  No documentation on file nor from mom saying that she is pt LG. Mom confirmed that there are no weapons or guns are in home.   The suicide prevention education provided includes the following: Suicide risk factors Suicide prevention and interventions National Suicide Hotline telephone number [facility] assessment telephone number [facility] Emergency Assistance 911 [facility] and/or Residential Mobile Crisis Unit telephone number  Request made of family/significant other to: Remove weapons (e.g., guns, rifles, knives), all items previously/currently identified as safety concern.   Remove drugs/medications (over-the-counter, prescriptions, illicit drugs), all items previously/currently identified as a safety concern.  The family member/significant other verbalizes understanding of the suicide prevention education information provided.  The family member/significant other agrees to remove the items of safety concern listed above.  Isabella Bowens 02/28/2023, 10:32 AM

## 2023-02-28 NOTE — Discharge Summary (Addendum)
Physician Discharge Summary Note  Patient:  Nathan Holloway is an 27 y.o., male MRN:  [id number] DOB:  30-Mar-1996 Patient phone:  [phone] (home)  Patient address:   [address] [place] [address],  Total Time spent with patient: 15 minutes  Date of Admission:  02/23/2023 Date of Discharge: 02/28/2023  Reason for Admission:   Nathan Holloway is a 27 year old male voluntarily admitted to [facility] from [facility] for "I did not feel safe" and symptoms consistent with his psychiatric conditions.  Past psychiatric history includes diagnosis of schizoaffective disorder, bipolar type; numerous hospitalizations; no reported history of harm or suicidal thoughts/ideation/attempts.   [name] was at home with his mother and grandma.  He reported that he "did not feel safe" with his mother and grandma, did not trust the meds that he received due to them being different colors, and had a sensation that the skin was peeling from his forehead (although he also says that his grandma put something there).  He also describes "voodoos" which prompted him to call EMS. [name] was recently discharged from [facility] on 02/20/2023, where he had a 7-day stay and received an injection of Abilify Maintena 400 (confirmed by our pharmacist who called [facility]).   Principal Problem: Schizoaffective disorder, bipolar type [facility]) Discharge Diagnoses: Principal Problem:   Schizoaffective disorder, bipolar type (HCC)   Past Psychiatric History: Past psychiatric history includes diagnosis of schizoaffective disorder, bipolar type; numerous hospitalizations; no reported history of harm or suicidal thoughts/ideation/attempts  Past Medical History:  Past Medical History:  Diagnosis Date   Manic behavior (HCC)    Psychotic affective disorder (HCC) 08/30/2015   History reviewed. No pertinent surgical history. Family History:  Family History  Problem Relation Age of Onset   Healthy Mother    Family Psychiatric   History: See H&P Social History:  Social History   Substance and Sexual Activity  Alcohol Use No     Social History   Substance and Sexual Activity  Drug Use Not Currently   Types: Marijuana    Social History   Socioeconomic History   Marital status: Single    Spouse name: Not on file   Number of children: Not on file   Years of education: Not on file   Highest education level: Not on file  Occupational History   Not on file  Tobacco Use   Smoking status: Never   Smokeless tobacco: Never  Vaping Use   Vaping Use: Every day   Substances: Nicotine, Flavoring  Substance and Sexual Activity   Alcohol use: No   Drug use: Not Currently    Types: Marijuana   Sexual activity: Yes  Other Topics Concern   Not on file  Social History Narrative   Not on file   Social Determinants of Health   Financial Resource Strain: Low Risk  (09/18/2020)   Overall Financial Resource Strain (CARDIA)    Difficulty of Paying Living Expenses: Not very hard  Food Insecurity: No Food Insecurity (02/23/2023)   Hunger Vital Sign    Worried About Running Out of Food in the Last Year: Never true    Ran Out of Food in the Last Year: Never true  Transportation Needs: No Transportation Needs (02/23/2023)   PRAPARE - Administrator, Civil Service (Medical): No    Lack of Transportation (Non-Medical): No  Physical Activity: Sufficiently Active (09/18/2020)   Exercise Vital Sign    Days of Exercise per Week: 4 days  Minutes of Exercise per Session: 60 min  Stress: No Stress Concern Present (09/18/2020)   [employer] of Occupational Health - Occupational Stress Questionnaire    Feeling of Stress : Not at all  Social Connections: Socially Isolated (09/18/2020)   Social Connection and Isolation Panel [NHANES]    Frequency of Communication with Friends and Family: More than three times a week    Frequency of Social Gatherings with Friends and Family: Twice a week    Attends Religious  Services: Never    Database administrator or Organizations: No    Attends Engineer, structural: Never    Marital Status: Never married    Hospital Course:   During the patient's hospitalization, patient had extensive initial psychiatric evaluation, and follow-up psychiatric evaluations every day.   Psychiatric diagnoses provided upon initial assessment: Schizoaffective, bipolar type   Patient's psychiatric medications were adjusted on admission: Started Zyprexa 5 twice daily, continued Depakote 250 twice daily, started trazodone 50 as needed for sleep, started hydroxyzine 250 3 times daily as needed   During the hospitalization, other adjustments were made to the patient's psychiatric medication regimen: Zyprexa was titrated up to 20 and then combined to a single nighttime dose due to daytime sedation.   Patient's care was discussed during the interdisciplinary team meeting every day during the hospitalization.   The patient is not having side effects to prescribed psychiatric medication.   Gradually, patient started adjusting to milieu. The patient was evaluated each day by a clinical provider to ascertain response to treatment. Improvement was noted by the patient's report of decreasing symptoms, improved sleep and appetite, affect, medication tolerance, behavior, and participation in unit programming.  Patient was asked each day to complete a self inventory noting mood, mental status, pain, new symptoms, anxiety and concerns.   Symptoms were reported as significantly decreased or resolved completely by discharge.  The patient reports that their mood is stable.  The patient denied having suicidal thoughts for more than 48 hours prior to discharge.  Patient denies having homicidal thoughts.  Patient denies having auditory hallucinations.  Patient denies any visual hallucinations or other symptoms of psychosis.  The patient was motivated to continue taking medication with a goal of  continued improvement in mental health.    The patient reports their target psychiatric symptoms of paranoid delusions responded well to the psychiatric medications, and the patient reports overall benefit other psychiatric hospitalization. Supportive psychotherapy was provided to the patient. The patient also participated in regular group therapy while hospitalized. Coping skills, problem solving as well as relaxation therapies were also part of the unit programming.   Labs were reviewed with the patient, and abnormal results were discussed with the patient.   The patient is able to verbalize their individual safety plan to this provider.   # It is recommended to the patient to continue psychiatric medications as prescribed, after discharge from the hospital.     # It is recommended to the patient to follow up with your outpatient psychiatric provider and PCP.   # It was discussed with the patient, the impact of alcohol, drugs, tobacco have been there overall psychiatric and medical wellbeing, and total abstinence from substance use was recommended the patient.ed.   # Prescriptions provided or sent directly to preferred pharmacy at discharge. Patient agreeable to plan. Given opportunity to ask questions. Appears to feel comfortable with discharge.    # In the event of worsening symptoms, the patient is instructed to call the  crisis hotline, 911 and or go to the nearest ED for appropriate evaluation and treatment of symptoms. To follow-up with primary care provider for other medical issues, concerns and or health care needs   # Patient was discharged home with mom with a plan to follow up as noted below.  Physical Findings: AIMS: 0   Musculoskeletal: Strength & Muscle Tone: within normal limits Gait & Station: normal Patient leans: N/A   Psychiatric Specialty Exam:  Presentation  General Appearance:  Appropriate for Environment  Eye Contact: Good  Speech: Normal Rate  Speech  Volume: Normal  Handedness: Right   Mood and Affect  Mood: Euthymic  Affect: Appropriate   Thought Process  Thought Processes: Coherent  Descriptions of Associations:Intact  Orientation:Full (Time, Place and Person)  Thought Content:Logical  History of Schizophrenia/Schizoaffective disorder:Yes  Duration of Psychotic Symptoms:Greater than six months  Hallucinations:Hallucinations: None  Ideas of Reference:None  Suicidal Thoughts:Suicidal Thoughts: No  Homicidal Thoughts:Homicidal Thoughts: No   Sensorium  Memory: Immediate Good; Recent Good; Remote Good  Judgment: Good  Insight: Good   Executive Functions  Concentration: Good  Attention Span: Good  Recall: Good  Fund of Knowledge: Good  Language: Good   Psychomotor Activity  Psychomotor Activity: Psychomotor Activity: Normal   Assets  Assets: Communication Skills; Desire for Improvement; Housing; Intimacy; Physical Health; Social Support; Talents/Skills; Vocational/Educational   Sleep  Sleep: Sleep: Good Number of Hours of Sleep: 8    Physical Exam: Physical Exam Vitals and nursing note reviewed.  HENT:     Head: Normocephalic and atraumatic.  Pulmonary:     Effort: Pulmonary effort is normal.  Neurological:     General: No focal deficit present.     Mental Status: He is alert.    Review of Systems  Constitutional:  Negative for fever.  Cardiovascular:  Negative for chest pain and palpitations.  Gastrointestinal:  Negative for constipation, diarrhea, nausea and vomiting.  Neurological:  Negative for dizziness, weakness and headaches.   Blood pressure 124/77, pulse 68, temperature 97.8 F (36.6 C), temperature source Oral, resp. rate 16, height 5\' 9"  (1.753 m), weight 73.5 kg, SpO2 97 %. Body mass index is 23.92 kg/m.   Social History   Tobacco Use  Smoking Status Never  Smokeless Tobacco Never   Tobacco Cessation:  N/A, patient does not currently use  tobacco products   Blood Alcohol level:  Lab Results  Component Value Date   ETH <10 02/22/2023   ETH <10 02/14/2023    Metabolic Disorder Labs:  Lab Results  Component Value Date   HGBA1C 4.2 (L) 02/22/2023   MPG 73.84 02/22/2023   MPG 88 02/14/2023   Lab Results  Component Value Date   PROLACTIN 5.7 02/22/2023   PROLACTIN 11.2 04/07/2022   Lab Results  Component Value Date   CHOL 189 02/22/2023   TRIG 134 02/22/2023   HDL 66 02/22/2023   CHOLHDL 2.9 02/22/2023   VLDL 27 02/22/2023   LDLCALC 96 02/22/2023   LDLCALC 117 (H) 02/14/2023    See Psychiatric Specialty Exam and Suicide Risk Assessment completed by Attending Physician prior to discharge.  Discharge destination:  Home  Is patient on multiple antipsychotic therapies at discharge:  No   Has Patient had three or more failed trials of antipsychotic monotherapy by history:  No  Recommended Plan for Multiple Antipsychotic Therapies: NA  Discharge Instructions     Diet - low sodium heart healthy   Complete by: As directed    Increase activity slowly  Complete by: As directed       Allergies as of 02/28/2023       Reactions   Haldol [haloperidol] Other (See Comments)   Severe dystonic reaction   Risperidone And Related Other (See Comments)   Severe dystonic reaction   Milk (cow) Other (See Comments)   GI Upset   Pork-derived Products Other (See Comments)   GI Upset        Medication List     STOP taking these medications    valproic acid 250 MG/5ML solution Commonly known as: DEPAKENE       TAKE these medications      Indication  Abilify Maintena 400 MG Srer injection Generic drug: ARIPiprazole ER Inject 400 mg into the muscle every 28 (twenty-eight) days.  Indication: Schizophrenia   divalproex 250 MG DR tablet Commonly known as: DEPAKOTE Take 1 tablet (250 mg total) by mouth 2 (two) times daily.  Indication: Schizophrenia   hydrOXYzine 25 MG tablet Commonly known as:  ATARAX Take 1 tablet (25 mg total) by mouth 3 (three) times daily as needed for anxiety.  Indication: Feeling Anxious   OLANZapine zydis 20 MG disintegrating tablet Commonly known as: ZYPREXA Take 1 tablet (20 mg total) by mouth at bedtime. What changed:  medication strength how much to take  Indication: Schizophrenia   traZODone 50 MG tablet Commonly known as: DESYREL Take 1 tablet (50 mg total) by mouth at bedtime as needed for sleep. What changed:  medication strength how much to take when to take this reasons to take this  Indication: Trouble Sleeping        Follow-up Information     [facility] Go on 03/22/2023.   Why: You have an appointment for medication management services on 03/22/23 at 2:00 pm.  This appointment will be held in person. Contact information: [address] [place] [address] [phone]         [facility]. Schedule an appointment as soon as possible for a visit.   Why: Please contact this provider personally (per provider request) to schedule an appointment for therapy services as soon as possible. Contact information: [address] [place], [place] [address] [phone]                 Follow-up recommendations:   Activity: as tolerated   Diet: heart healthy   Other: -Follow-up with your outpatient psychiatric provider -instructions on appointment date, time, and address (location) are provided to you in discharge paperwork.   -Take your psychiatric medications as prescribed at discharge - instructions are provided to you in the discharge paperwork   -Follow-up with outpatient primary care doctor and other specialists -for management of chronic medical disease, including: Elevated creatinine.    -Testing: Follow-up with outpatient provider for abnormal lab results: Elevated creatinine follow-up PCP.   -Recommend abstinence from alcohol, tobacco, and  other illicit drug use at discharge.    -If your psychiatric symptoms recur, worsen, or if you have side effects to your psychiatric medications, call your outpatient psychiatric provider, 911, [phone] or go to the nearest emergency department.   -If suicidal thoughts recur, call your outpatient psychiatric provider, 911, [phone] or go to the nearest emergency department.  Signed: Meryl Dare, MD 02/28/2023, 9:43 AM

## 2023-02-28 NOTE — Plan of Care (Signed)
  Problem: Education: Goal: Knowledge of  General Education information/materials will improve Outcome: Progressing   Problem: Education: Goal: Emotional status will improve Outcome: Progressing   Problem: Education: Goal: Verbalization of understanding the information provided will improve Outcome: Progressing   Problem: Safety: Goal: Periods of time without injury will increase Outcome: Progressing

## 2023-02-28 NOTE — Progress Notes (Signed)
   02/28/23 0645  15 Minute Checks  Location Dayroom  Visual Appearance Calm  Behavior Composed  Sleep (Behavioral Health Patients Only)  Calculate sleep? (Click Yes once per 24 hr at 0600 safety check) Yes  Documented sleep last 24 hours 7.75

## 2023-02-28 NOTE — [facility] Suicide Risk Assessment (Signed)
Suicide Risk Assessment  Discharge Assessment    [facility] Discharge Suicide Risk Assessment   Principal Problem: Schizoaffective disorder, bipolar type [facility]) Discharge Diagnoses: Principal Problem:   Schizoaffective disorder, bipolar type (HCC)  During the patient's hospitalization, patient had extensive initial psychiatric evaluation, and follow-up psychiatric evaluations every day.  Psychiatric diagnoses provided upon initial assessment: Schizoaffective, bipolar type  Patient's psychiatric medications were adjusted on admission: Zyprexa 20 at bedtime, continued Depakote 250 twice daily, trazodone 50 at bed.  Hydroxyzine to 25 as needed  During the hospitalization, other adjustments were made to the patient's psychiatric medication regimen: Zyprexa 20 at bedtime, continued Depakote 250 twice daily, trazodone 50 at bed.  Hydroxyzine to 25 as needed  Patient's care was discussed during the interdisciplinary team meeting every day during the hospitalization.  The patient is not having side effects to prescribed psychiatric medication.  Gradually, patient started adjusting to milieu. The patient was evaluated each day by a clinical provider to ascertain response to treatment. Improvement was noted by the patient's report of decreasing symptoms, improved sleep and appetite, affect, medication tolerance, behavior, and participation in unit programming.  Patient was asked each day to complete a self inventory noting mood, mental status, pain, new symptoms, anxiety and concerns.   Symptoms were reported as significantly decreased or resolved completely by discharge.  The patient reports that their mood is stable.  The patient denied having suicidal thoughts for more than 48 hours prior to discharge.  Patient denies having homicidal thoughts.  Patient denies having auditory hallucinations.  Patient denies any visual hallucinations or other symptoms of psychosis.  The patient was motivated to continue  taking medication with a goal of continued improvement in mental health.   The patient reports their target psychiatric symptoms of paranoid delusions responded well to the psychiatric medications, and the patient reports overall benefit other psychiatric hospitalization. Supportive psychotherapy was provided to the patient. The patient also participated in regular group therapy while hospitalized. Coping skills, problem solving as well as relaxation therapies were also part of the unit programming.  Labs were reviewed with the patient, and abnormal results were discussed with the patient.  The patient is able to verbalize their individual safety plan to this provider.  # It is recommended to the patient to continue psychiatric medications as prescribed, after discharge from the hospital.    # It is recommended to the patient to follow up with your outpatient psychiatric provider and PCP.  # It was discussed with the patient, the impact of alcohol, drugs, tobacco have been there overall psychiatric and medical wellbeing, and total abstinence from substance use was recommended the patient.ed.  # Prescriptions provided or sent directly to preferred pharmacy at discharge. Patient agreeable to plan. Given opportunity to ask questions. Appears to feel comfortable with discharge.    # In the event of worsening symptoms, the patient is instructed to call the crisis hotline, 911 and or go to the nearest ED for appropriate evaluation and treatment of symptoms. To follow-up with primary care provider for other medical issues, concerns and or health care needs  # Patient was discharged home with mom with a plan to follow up as noted below.    On day of discharge patient reports adherence to medications and his preference for the nighttime dosing for Zyprexa as it reduces his daytime sedation.  He slept well, is eating well, and reports no SI, HI, AVH.  He reports no paranoia.  He spoke to his mom  yesterday  about discharge and she is amenable to pick him up in town today.  He had questions about outpatient follow-up and we let him know that social workers included this and his discharge paperwork and that he may need to follow-up on scheduled appointment for the therapy services.  Patient reports no side effects of the medications including sedation, stiffness, nausea, bowel changes.  Total Time spent with patient: 15 minutes  Musculoskeletal: Strength & Muscle Tone: within normal limits Gait & Station: normal Patient leans: N/A  Psychiatric Specialty Exam  Presentation  General Appearance:  Appropriate for Environment  Eye Contact: Good  Speech: Normal Rate  Speech Volume: Normal  Handedness: Right   Mood and Affect  Mood: Euthymic  Duration of Depression Symptoms: Greater than two weeks  Affect: Appropriate   Thought Process  Thought Processes: Coherent  Descriptions of Associations:Intact  Orientation:Full (Time, Place and Person)  Thought Content:Logical  History of Schizophrenia/Schizoaffective disorder:Yes  Duration of Psychotic Symptoms:Greater than six months  Hallucinations:Hallucinations: None  Ideas of Reference:None  Suicidal Thoughts:Suicidal Thoughts: No  Homicidal Thoughts:Homicidal Thoughts: No   Sensorium  Memory: Immediate Good; Recent Good; Remote Good  Judgment: Good  Insight: Good   Executive Functions  Concentration: Good  Attention Span: Good  Recall: Good  Fund of Knowledge: Good  Language: Good   Psychomotor Activity  Psychomotor Activity: Psychomotor Activity: Normal   Assets  Assets: Communication Skills; Desire for Improvement; Housing; Intimacy; Physical Health; Social Support; Talents/Skills; Vocational/Educational   Sleep  Sleep: Sleep: Good Number of Hours of Sleep: 8   Physical Exam: Physical Exam Vitals and nursing note reviewed.  HENT:     Head: Normocephalic and  atraumatic.  Pulmonary:     Effort: Pulmonary effort is normal.  Neurological:     General: No focal deficit present.     Mental Status: He is alert.    Review of Systems  Constitutional:  Negative for fever.  Cardiovascular:  Negative for chest pain and palpitations.  Gastrointestinal:  Negative for constipation, diarrhea, nausea and vomiting.  Neurological:  Negative for dizziness, weakness and headaches.   Blood pressure 124/77, pulse 68, temperature 97.8 F (36.6 C), temperature source Oral, resp. rate 16, height 5\' 9"  (1.753 m), weight 73.5 kg, SpO2 97 %. Body mass index is 23.92 kg/m.  AIMS: 0  Mental Status Per Nursing Assessment::   On Admission:  NA  Demographic Factors:  Male  Loss Factors: Decrease in vocational status  Historical Factors: Impulsivity  Risk Reduction Factors:   Sense of responsibility to family, Employed, Living with another person, especially a relative, Positive social support, and Positive therapeutic relationship  Continued Clinical Symptoms:  Previous Psychiatric Diagnoses and Treatments  Cognitive Features That Contribute To Risk:  None    Suicide Risk:  Minimal: No identifiable suicidal ideation.  Patients presenting with no risk factors but with morbid ruminations; may be classified as minimal risk based on the severity of the depressive symptoms   Follow-up Information     [facility] Go on 03/22/2023.   Why: You have an appointment for medication management services on 03/22/23 at 2:00 pm.  This appointment will be held in person. Contact information: [address] [place] [address] [phone]         [facility]. Schedule an appointment as soon as possible for a visit.   Why: Please contact this provider personally (per provider request) to schedule an appointment for therapy services as soon as  possible. Contact information: [address] [place],  [place] [address] [phone]                 Plan Of Care/Follow-up recommendations:  Activity: as tolerated  Diet: heart healthy  Other: -Follow-up with your outpatient psychiatric provider -instructions on appointment date, time, and address (location) are provided to you in discharge paperwork.  -Take your psychiatric medications as prescribed at discharge - instructions are provided to you in the discharge paperwork  -Follow-up with outpatient primary care doctor and other specialists -for management of chronic medical disease, including: Elevated creatinine.   -Testing: Follow-up with outpatient provider for abnormal lab results: Elevated creatinine follow-up PCP.  -Recommend abstinence from alcohol, tobacco, and other illicit drug use at discharge.   -If your psychiatric symptoms recur, worsen, or if you have side effects to your psychiatric medications, call your outpatient psychiatric provider, 911, [phone] or go to the nearest emergency department.  -If suicidal thoughts recur, call your outpatient psychiatric provider, 911, 988 or go to the nearest emergency department.   Meryl Dare, MD 02/28/2023, 9:37 AM

## 2023-02-28 NOTE — Progress Notes (Signed)
Patient verbalizes readiness for discharge. All patient belongings returned to patient. Discharge instructions read and discussed with patient (appointments, medications, resources). Patient expressed gratitude for care provided. Patient discharged to lobby at 1215 where patients mom was waiting.

## 2023-12-03 ENCOUNTER — Ambulatory Visit (HOSPITAL_COMMUNITY)
Admission: EM | Admit: 2023-12-03 | Discharge: 2023-12-04 | Disposition: A | Discharge status: Other Acute Inpatient Hospital | Attending: Psychiatry | Admitting: Psychiatry

## 2023-12-03 DIAGNOSIS — F250 Schizoaffective disorder, bipolar type: Secondary | ICD-10-CM | POA: Diagnosis not present

## 2023-12-03 DIAGNOSIS — I498 Other specified cardiac arrhythmias: Secondary | ICD-10-CM | POA: Insufficient documentation

## 2023-12-03 LAB — CBC WITH DIFFERENTIAL/PLATELET
Abs Immature Granulocytes: 0.03 10*3/uL (ref 0.00–0.07)
Basophils Absolute: 0.1 10*3/uL (ref 0.0–0.1)
Basophils Relative: 1 %
Eosinophils Absolute: 0 10*3/uL (ref 0.0–0.5)
Eosinophils Relative: 0 %
HCT: 48.3 % (ref 39.0–52.0)
Hemoglobin: 16.3 g/dL (ref 13.0–17.0)
Immature Granulocytes: 0 %
Lymphocytes Relative: 21 %
Lymphs Abs: 2 10*3/uL (ref 0.7–4.0)
MCH: 28.9 pg (ref 26.0–34.0)
MCHC: 33.7 g/dL (ref 30.0–36.0)
MCV: 85.6 fL (ref 80.0–100.0)
Monocytes Absolute: 1 10*3/uL (ref 0.1–1.0)
Monocytes Relative: 10 %
Neutro Abs: 6.6 10*3/uL (ref 1.7–7.7)
Neutrophils Relative %: 68 %
Platelets: 200 10*3/uL (ref 150–400)
RBC: 5.64 MIL/uL (ref 4.22–5.81)
RDW: 12 % (ref 11.5–15.5)
WBC: 9.7 10*3/uL (ref 4.0–10.5)
nRBC: 0 % (ref 0.0–0.2)

## 2023-12-03 LAB — LIPID PANEL
Cholesterol: 218 mg/dL — ABNORMAL HIGH (ref 0–200)
HDL: 75 mg/dL (ref >40)
LDL Cholesterol: 134 mg/dL — ABNORMAL HIGH (ref 0–99)
Total CHOL/HDL Ratio: 2.9 ratio
Triglycerides: 46 mg/dL (ref <150)
VLDL: 9 mg/dL (ref 0–40)

## 2023-12-03 LAB — HEMOGLOBIN A1C
Hgb A1c MFr Bld: 4.2 % — ABNORMAL LOW (ref 4.8–5.6)
Mean Plasma Glucose: 73.84 mg/dL

## 2023-12-03 LAB — COMPREHENSIVE METABOLIC PANEL WITH GFR
ALT: 16 U/L (ref 0–44)
AST: 27 U/L (ref 15–41)
Albumin: 5.1 g/dL — ABNORMAL HIGH (ref 3.5–5.0)
Alkaline Phosphatase: 59 U/L (ref 38–126)
Anion gap: 13 (ref 5–15)
BUN: 14 mg/dL (ref 6–20)
CO2: 25 mmol/L (ref 22–32)
Calcium: 10.5 mg/dL — ABNORMAL HIGH (ref 8.9–10.3)
Chloride: 101 mmol/L (ref 98–111)
Creatinine, Ser: 1.51 mg/dL — ABNORMAL HIGH (ref 0.61–1.24)
GFR, Estimated: >60 mL/min (ref >60)
Glucose, Bld: 91 mg/dL (ref 70–99)
Potassium: 4 mmol/L (ref 3.5–5.1)
Sodium: 139 mmol/L (ref 135–145)
Total Bilirubin: 1.2 mg/dL (ref 0.0–1.2)
Total Protein: 8.9 g/dL — ABNORMAL HIGH (ref 6.5–8.1)

## 2023-12-03 LAB — ETHANOL: Alcohol, Ethyl (B): <10 mg/dL (ref <10)

## 2023-12-03 LAB — TSH: TSH: 3.278 u[IU]/mL (ref 0.350–4.500)

## 2023-12-03 LAB — MAGNESIUM: Magnesium: 1.9 mg/dL (ref 1.7–2.4)

## 2023-12-03 MED ORDER — OLANZAPINE 5 MG PO TABS
5.0000 mg | ORAL_TABLET | Freq: Two times a day (BID) | ORAL | Status: DC
  Administered 2023-12-03 – 2023-12-04 (×3): 5 mg via ORAL
  Filled 2023-12-03 (×3): qty 1

## 2023-12-03 MED ORDER — ACETAMINOPHEN 325 MG PO TABS: 650.0000 mg | ORAL_TABLET | Freq: Four times a day (QID) | ORAL | Status: DC | PRN

## 2023-12-03 MED ORDER — TRAZODONE HCL 50 MG PO TABS
50.0000 mg | ORAL_TABLET | Freq: Every evening | ORAL | Status: DC | PRN
  Administered 2023-12-03: 50 mg via ORAL
  Filled 2023-12-03: qty 1

## 2023-12-03 MED ORDER — OLANZAPINE 5 MG PO TABS: 5.0000 mg | ORAL_TABLET | Freq: Two times a day (BID) | ORAL | Status: DC

## 2023-12-03 MED ORDER — OLANZAPINE 10 MG IM SOLR
5.0000 mg | Freq: Three times a day (TID) | INTRAMUSCULAR | Status: DC | PRN
Start: 2023-12-03 — End: 2023-12-04

## 2023-12-03 MED ORDER — OLANZAPINE 5 MG PO TBDP
5.0000 mg | ORAL_TABLET | Freq: Three times a day (TID) | ORAL | Status: DC | PRN
  Administered 2023-12-03: 5 mg via ORAL
  Filled 2023-12-03: qty 1

## 2023-12-03 MED ORDER — HYDROXYZINE HCL 25 MG PO TABS
25.0000 mg | ORAL_TABLET | Freq: Three times a day (TID) | ORAL | Status: DC | PRN
  Administered 2023-12-03 (×2): 25 mg via ORAL
  Filled 2023-12-03 (×2): qty 1

## 2023-12-03 MED ORDER — OLANZAPINE 10 MG IM SOLR: 10.0000 mg | Freq: Three times a day (TID) | INTRAMUSCULAR | Status: DC | PRN

## 2023-12-03 MED ORDER — ALUM & MAG HYDROXIDE-SIMETH 200-200-20 MG/5ML PO SUSP: 30.0000 mL | ORAL | Status: DC | PRN

## 2023-12-03 MED ORDER — MAGNESIUM HYDROXIDE 400 MG/5ML PO SUSP
30.0000 mL | Freq: Every day | ORAL | Status: DC | PRN
Start: 2023-12-03 — End: 2023-12-04

## 2023-12-03 MED ORDER — LORAZEPAM 1 MG PO TABS
1.0000 mg | ORAL_TABLET | Freq: Once | ORAL | Status: AC
  Administered 2023-12-03: 1 mg via ORAL
  Filled 2023-12-03: qty 1

## 2023-12-03 NOTE — Progress Notes (Signed)
   12/03/23 0634  [facility] Triage Screening (Walk-ins at [facility] only)  How Did You Hear About Us ? Family/Friend  What Is the Reason for Your Visit/Call Today? Pt. is voluntary and arrived accompanied by his parents following an episode. He states that he is suicidal and is having SI, HI and AVH. Pt states that he is currently seeing the holy spirit and hearing commands of harming himself and other. Pt. had outbursts screaming for the lord saying that he wants to die and that he wants to be forgiven. Pt. denies alcohol or substance currently.  How Long Has This Been Causing You Problems? > than 6 months  Have You Recently Had Any Thoughts About Hurting Yourself? Yes  How long ago did you have thoughts about hurting yourself? This morning- currently  Are You Planning to Commit Suicide/Harm Yourself At This time? No  Have you Recently Had Thoughts About Hurting Someone [name]? Yes  How long ago did you have thoughts of harming others? Today, currently  Are You Planning To Harm Someone At This Time? No  Physical Abuse Denies  Verbal Abuse Denies  Sexual Abuse Denies  Exploitation of patient/patient's resources Denies  Self-Neglect Denies  Are you currently experiencing any auditory, visual or other hallucinations? Yes  Have You Used Any Alcohol or Drugs in the Past 24 Hours? No  Do you have any current medical co-morbidities that require immediate attention? No  Clinician description of patient physical appearance/behavior: Currently, going through an episode.  If access to [facility] was not available, would you have sought care in the Emergency Department? Yes  Determination of Need Emergent (2 hours)  Options For Referral Facility-Based Crisis;[facility] Urgent Care;Other: Comment;Inpatient Hospitalization  Determination of Need filed? Yes

## 2023-12-03 NOTE — ED Notes (Signed)
 Patient is currently sitting on unit watching television, patient appears to be in no acute distress, will continue to monitor for safety.

## 2023-12-03 NOTE — ED Notes (Signed)
 Pt sleeping at this time. Rise and fall of chest noted. Pt in NAD at this time. Will continue to monitor.

## 2023-12-03 NOTE — ED Notes (Signed)
 Pt presented to [facility] and admitted to Obs voluntarily w/ c/o manic behavior. Hx includes manic behavior, psychotic affective disorder. Pt disorganized, tangential, hyperreligous, labile, hyperverbal. Endorsed SI during skin check.Denies HI/AVH. A&Ox3, disoriented to situation. Pt in NAD at this time. Encouragement and support given. Will continue to monitor.

## 2023-12-03 NOTE — ED Notes (Signed)
 Pt administered PRN zyprexa 5mg  po and hydroxyzine 25 mg po for escalation of anxiety. Pt displaying increasing speed and volume of speech, and fervent focus on hyper-religious themes.

## 2023-12-03 NOTE — ED Notes (Signed)
 Pt was administered PRN hydroxyzine 25mg  PO and olanzapine 5mg  PO for e

## 2023-12-03 NOTE — ED Provider Notes (Signed)
 [facility] Continuous Assessment Admission H&P  Date: 12/03/23 Patient Name: Nathan Holloway MRN: [id number] Chief Complaint: Manic behavior  Diagnoses:  Final diagnoses:  Schizoaffective disorder, bipolar type Nathan Holloway)    HPI: patient presented to [facility] as a walk in  accompanied by his mother with complaints of manic behavior.  Nathan Holloway, 28 y.o., male patient seen face to face by this provider, consulted with Nathan Holloway; and chart reviewed on 12/03/23.  Per chart review patient has a past psychiatric history of schizoaffective disorder bipolar type.  He has a history of multiple inpatient psychiatric admissions with his last admission being at [facility] on 02/2024.  Upon discharge patient did not follow-up with outpatient psychiatric resources and he currently has no  psychiatric services in place.  He takes no medications.   Per triage note, "he states that he is suicidal and is having SI, HI, and AVH.  Patient states that he is currently seeing the [facility] and hearing commands of harming himself and others.  Patient had outburst screaming for the Nathan Holloway saying he wants to die and he wants to be forgiven".  Currently on assessment Nathan Holloway is observed sitting in the assessment room in no acute distress.  He is pleasant upon approach.  He is alert/oriented to self, time, place, but not oriented to situation.  He is cooperative, and attentive, and redirectable.  His speech is  tangential with pressured speech at increased rate and tone. He is hyper verbal. He is grandiose and believes that he can be the Interior and spatial designer and run this mental health institution.  He has flight of ideas.  He is hyperreligious and delusional.  He speaks of the Lord often and appears to be preaching at times.  He believes that [place] is involved with the government and there are people that are conspiring against the healthcare system and it is going to be taken down.  He tells this Clinical research associate, "you wont even have a  job when they are done".  He denies any depression but states it has been a difficult time because he is having girlfriend problems.  He is unable to state when his last slept or eaten.  He endorses auditory hallucinations of hearing the Nathan Holloway talk to him.  He endorses visual hallucinations and states he saw "the enemy" last night, stating it was the devil and he saw shadows all around him.  He does not appear to be responding to internal/external stimuli.  He is paranoid and believes that there are people out there who would want to hurt him, who are criminals.  Patient denies any suicidal or homicidal ideations at this time.  He proceeds to state that suicide in the greek language does not mean to kill yourself and means to take a rest.  He denies that he told staff during triage that he was suicidal or homicidal.  Discussed inpatient psychiatric admission and patient is in agreement.  He would like to be reevaluated because he does not believe that he is truly diagnosed with schizoaffective disorder.  He is adamant that he does not want to take medications.  Nathan Holloway (mom) states patient stopped taking medications roughly 1 month after discharge around March 24, 2023 and has been unmedicated since that time.  However he has been functioning and has been able to hold down his full-time job.  She noticed a difference about 1 week ago after patient had went to the emergency department complaining of shoulder pain and he  received a steroid injection.  About 2 days ago she noticed a significant change in his behavior, he has not slept in the past 2 days, called his sister in the middle of the night and stated that a spirit had been placed on him.   Total Time spent with patient: 45 minutes  Musculoskeletal  Strength & Muscle Tone: within normal limits Gait & Station: normal Patient leans: N/A  Psychiatric Specialty Exam  Presentation General Appearance:  Casual  Eye  Contact: Fleeting  Speech: Pressured  Speech Volume: Increased  Handedness: Right   Mood and Affect  Mood: Euphoric  Affect: Congruent   Thought Process  Thought Processes: Coherent  Descriptions of Associations:Tangential  Orientation:Full (Time, Place and Person)  Thought Content:Paranoid Ideation; Scattered; Tangential; Delusions  Diagnosis of Schizophrenia or Schizoaffective disorder in past: Yes  Duration of Psychotic Symptoms: Greater than six months  Hallucinations:Hallucinations: Auditory; Visual Description of Auditory Hallucinations: Hears the voice of the [facility] Description of Visual Hallucinations: he saw the enemy last night states it was a lot of shadows moving around his room  Ideas of Reference:Delusions; Paranoia  Suicidal Thoughts:Suicidal Thoughts: No  Homicidal Thoughts:Homicidal Thoughts: No   Sensorium  Memory: Recent Fair; Immediate Fair; Remote Fair  Judgment: Impaired  Insight: Lacking   Executive Functions  Concentration: Poor  Attention Span: Poor  Recall: Poor  Fund of Knowledge: Poor  Language: Poor   Psychomotor Activity  Psychomotor Activity:Psychomotor Activity: Increased   Assets  Assets: Physical Health; Resilience; Social Support   Sleep  Sleep:Sleep: Poor Number of Hours of Sleep: 0   Nutritional Assessment (For OBS and FBC admissions only) Has the patient had a weight loss or gain of 10 pounds or more in the last 3 months?: No Has the patient had a decrease in food intake/or appetite?: Yes Does the patient have dental problems?: No Does the patient have eating habits or behaviors that may be indicators of an eating disorder including binging or inducing vomiting?: No Has the patient recently lost weight without trying?: 2.0 Has the patient been eating poorly because of a decreased appetite?: 1 Malnutrition Screening Tool Score: 3    Physical Exam Eyes:     General:         Right eye: No discharge.        Left eye: No discharge.  Cardiovascular:     Rate and Rhythm: Normal rate.  Pulmonary:     Effort: Pulmonary effort is normal. No respiratory distress.  Musculoskeletal:        General: Normal range of motion.     Cervical back: Normal range of motion.  Skin:    Coloration: Skin is not jaundiced or pale.  Neurological:     Mental Status: He is alert and oriented to person, place, and time.  Psychiatric:        Attention and Perception: He is inattentive. He perceives auditory and visual hallucinations.        Mood and Affect: Mood is anxious and elated.        Speech: Speech is rapid and pressured and tangential.        Behavior: Behavior is cooperative.        Thought Content: Thought content is paranoid and delusional.        Cognition and Memory: Cognition normal.        Judgment: Judgment is impulsive.    Review of Systems  Constitutional:  Negative for chills and fever.  HENT:  Negative for  hearing loss.   Respiratory:  Negative for cough and shortness of breath.   Cardiovascular:  Negative for chest pain.  Musculoskeletal: Negative.   Neurological:  Negative for tremors, seizures and headaches.  Psychiatric/Behavioral:  The patient is nervous/anxious and has insomnia.     Blood pressure (!) 133/101, pulse 97, resp. rate 20, SpO2 99%. There is no height or weight on file to calculate BMI.  Past Psychiatric History: Schizoaffective disorder bipolar type  Medication history (per chart review): Olanzapine 15 nightly, Depakene 500 twice daily--good response to this pair but had weight gain. Latuda 80 nightly, Trileptal 150 twice daily --unclear response to this pair  Is the patient at risk to self? No  Has the patient been a risk to self in the past 6 months? No .    Has the patient been a risk to self within the distant past? No   Is the patient a risk to others? No   Has the patient been a risk to others in the past 6 months? No   Has  the patient been a risk to others within the distant past? No   Past Medical History:     Diagnosis Date   Manic behavior (HCC)    Psychotic affective disorder (HCC) 08/30/2015    Family History:  Family History  Problem Relation Age of Onset   Healthy Mother      Social History:   Lives with mother.  Works full-time for [employer] in [place], his position is a Science writer.  He is a [place] and is very involved in charge  Last Labs:  Admission on 12/03/2023  Component Date Value Ref Range Status   WBC 12/03/2023 9.7  4.0 - 10.5 K/uL Final   RBC 12/03/2023 5.64  4.22 - 5.81 MIL/uL Final   Hemoglobin 12/03/2023 16.3  13.0 - 17.0 g/dL Final   HCT 16/05/9603 48.3  39.0 - 52.0 % Final   MCV 12/03/2023 85.6  80.0 - 100.0 fL Final   MCH 12/03/2023 28.9  26.0 - 34.0 pg Final   MCHC 12/03/2023 33.7  30.0 - 36.0 g/dL Final   RDW 54/04/8118 12.0  11.5 - 15.5 % Final   Platelets 12/03/2023 200  150 - 400 K/uL Final   nRBC 12/03/2023 0.0  0.0 - 0.2 % Final   Neutrophils Relative % 12/03/2023 68  % Final   Neutro Abs 12/03/2023 6.6  1.7 - 7.7 K/uL Final   Lymphocytes Relative 12/03/2023 21  % Final   Lymphs Abs 12/03/2023 2.0  0.7 - 4.0 K/uL Final   Monocytes Relative 12/03/2023 10  % Final   Monocytes Absolute 12/03/2023 1.0  0.1 - 1.0 K/uL Final   Eosinophils Relative 12/03/2023 0  % Final   Eosinophils Absolute 12/03/2023 0.0  0.0 - 0.5 K/uL Final   Basophils Relative 12/03/2023 1  % Final   Basophils Absolute 12/03/2023 0.1  0.0 - 0.1 K/uL Final   Immature Granulocytes 12/03/2023 0  % Final   Abs Immature Granulocytes 12/03/2023 0.03  0.00 - 0.07 K/uL Final   Performed at [facility], [address], [place], [place] [address]   Sodium 12/03/2023 139  135 - 145 mmol/L Final   Potassium 12/03/2023 4.0  3.5 - 5.1 mmol/L Final   Chloride 12/03/2023 101  98 - 111 mmol/L Final   CO2 12/03/2023 25  22 - 32 mmol/L Final   Glucose, Bld 12/03/2023 91  70 -  99 mg/dL Final  Glucose reference range applies only to samples taken after fasting for at least 8 hours.   BUN 12/03/2023 14  6 - 20 mg/dL Final   Creatinine, Ser 12/03/2023 1.51 (H)  0.61 - 1.24 mg/dL Final   Calcium 81/19/1478 10.5 (H)  8.9 - 10.3 mg/dL Final   Total Protein 29/56/2130 8.9 (H)  6.5 - 8.1 g/dL Final   Albumin 86/57/8469 5.1 (H)  3.5 - 5.0 g/dL Final   AST 62/95/2841 27  15 - 41 U/L Final   ALT 12/03/2023 16  0 - 44 U/L Final   Alkaline Phosphatase 12/03/2023 59  38 - 126 U/L Final   Total Bilirubin 12/03/2023 1.2  0.0 - 1.2 mg/dL Final   GFR, Estimated 12/03/2023 >60  >60 mL/min Final   Comment: (NOTE) Calculated using the CKD-EPI Creatinine Equation (2021)    Anion gap 12/03/2023 13  5 - 15 Final   Performed at [facility], [address] [place], [place] [address]   Hgb A1c MFr Bld 12/03/2023 4.2 (L)  4.8 - 5.6 % Final   Comment: (NOTE) Pre diabetes:          5.7%-6.4%  Diabetes:              >6.4%  Glycemic control for   <7.0% adults with diabetes    Mean Plasma Glucose 12/03/2023 73.84  mg/dL Final   Performed at [facility], [address], [place], [place] [address]   Magnesium 12/03/2023 1.9  1.7 - 2.4 mg/dL Final   Performed at [facility], [address] [place], [place] [address]   Alcohol, Ethyl (B) 12/03/2023 <10  <10 mg/dL Final   Comment: (NOTE) Lowest detectable limit for serum alcohol is 10 mg/dL.  For medical purposes only. Performed at [facility], [address], [place], [place] [address]    Cholesterol 12/03/2023 218 (H)  0 - 200 mg/dL Final   Triglycerides 42/59/5638 46  <150 mg/dL Final   HDL 75/64/3329 75  >40 mg/dL Final   Total CHOL/HDL Ratio 12/03/2023 2.9  RATIO Final   VLDL 12/03/2023 9  0 - 40 mg/dL Final   LDL Cholesterol 12/03/2023 134 (H)  0 - 99 mg/dL Final   Comment:        Total Cholesterol/HDL:CHD Risk Coronary Heart Disease Risk Table                     Men   Women  1/2 Average  Risk   3.4   3.3  Average Risk       5.0   4.4  2 X Average Risk   9.6   7.1  3 X Average Risk  23.4   11.0        Use the calculated Patient Ratio above and the CHD Risk Table to determine the patient's CHD Risk.        ATP III CLASSIFICATION (LDL):  <100     mg/dL   Optimal  518-841  mg/dL   Near or Above                    Optimal  130-159  mg/dL   Borderline  660-630  mg/dL   High  >160     mg/dL   Very High Performed at [facility], [address], [place], [place] [address]    TSH 12/03/2023 3.278  0.350 - 4.500 uIU/mL Final   Comment:  Performed by a 3rd Generation assay with a functional sensitivity of <=0.01 uIU/mL. Performed at [facility], [address] [place], [place] [address]     Allergies: Haldol [haloperidol], Risperidone and related, Milk (cow), and Pork-derived products  Medications:  Facility Ordered Medications  Medication   acetaminophen (TYLENOL) tablet 650 mg   alum & mag hydroxide-simeth (MAALOX/MYLANTA) 200-200-20 MG/5ML suspension 30 mL   magnesium hydroxide (MILK OF MAGNESIA) suspension 30 mL   OLANZapine zydis (ZYPREXA) disintegrating tablet 5 mg   OLANZapine (ZYPREXA) injection 5 mg   OLANZapine (ZYPREXA) injection 10 mg   traZODone (DESYREL) tablet 50 mg   hydrOXYzine (ATARAX) tablet 25 mg   OLANZapine (ZYPREXA) tablet 5 mg   PTA Medications  Medication Sig   ARIPiprazole ER (ABILIFY MAINTENA) 400 MG SRER injection Inject 400 mg into the muscle every 28 (twenty-eight) days.   hydrOXYzine (ATARAX) 25 MG tablet Take 1 tablet (25 mg total) by mouth 3 (three) times daily as needed for anxiety.   OLANZapine zydis (ZYPREXA) 20 MG disintegrating tablet Take 1 tablet (20 mg total) by mouth at bedtime.   traZODone (DESYREL) 50 MG tablet Take 1 tablet (50 mg total) by mouth at bedtime as needed for sleep.   divalproex (DEPAKOTE) 250 MG DR tablet Take 1 tablet (250 mg total) by mouth 2 (two) times daily.      Medical Decision Making   Patient recommended for inpatient psychiatric admission.  He will be admitted to the continuous assessment unit while awaiting inpatient bed availability.   Medications:  Zyprexa 5 mg twice daily  Lab Orders         CBC with Differential/Platelet         Comprehensive metabolic panel         Hemoglobin A1c         Magnesium         Ethanol         Lipid panel         TSH         POCT Urine Drug Screen - (I-Screen)      EKG    Recommendations  Based on my evaluation the patient does not appear to have an emergency medical condition.  Patient accepted to [facility] for inpatient psychiatric admission  Costella Dirks, NP 12/03/23  11:02 AM

## 2023-12-03 NOTE — BH Assessment (Signed)
 Comprehensive Clinical Assessment (CCA) Note  12/03/2023 Nathan Holloway [id number]  DISPOSITION: Patient meets criteria for an inpatient admission.   The patient demonstrates the following risk factors for suicide: Chronic risk factors for suicide include: N/A. Acute risk factors for suicide include: N/A. Protective factors for this patient include: coping skills. Considering these factors, the overall suicide risk at this point appears to be low. Patient is appropriate for outpatient follow up.   Patient is a 44 old male that presents this date with mania having a PMHx significant for schizoaffective disorder bipolar type although does not currently have any OP services. Patient has a history of multiple inpatient psychiatric admissions with his last admission being at [facility] on 02/2024.  Upon discharge patient did not follow-up with outpatient psychiatric resources and he currently has no psychiatric services in place. He takes no medications. Patient currently resides with his mother Nathan Holloway [phone] who brought him in this date after she reported that patient has not slept in the last 48 hours and has been experiencing a manic episode as patient contnues in triage to be hyper verbal and is difficult to redirect. Patient denies any SI or HI although reports active AVH stating that he, "hears the voice of the lord," and "sees the enemy's at hand." Patient denies any SA history.  Patient speaks at length about the Lord and appears to be preaching at times as this Clinical research associate attempts to assess. He believes that Nathan Holloway is involved with the government and there are people that are conspiring against the healthcare system and it is going to be taken down. He denies any depression but states it has been a difficult time because he is having girlfriend problems and recently started a relationship that, "may not work out.".  He is unable to state when his last slept or eaten. He does not appear to be  responding to internal/external stimuli at the time of assessment. He is paranoid and believes that there are people out there who would want to hurt him and tells this writer to, "watch out for them." Patient denies any suicidal or homicidal ideations at this time. He proceeds to state that suicide in the greek language does not mean to kill yourself and means to take a rest.  He denies that he told staff during triage that he was suicidal or homicidal.      Nathan Holloway (mom) states patient stopped taking medications roughly 1 month after discharge around March 24, 2023 and has been unmedicated since that time. She noticed a difference about 1 week ago after patient had went to the emergency department complaining of shoulder pain and he received a steroid injection. About 2 days ago she noticed a significant change in his behavior, he has not slept in the past 2 days, called his sister in the middle of the night and stated that a spirit had been placed on him.    He is pleasant upon approach.  He is alert/oriented to self, time, place, but not oriented to situation. He is cooperative, and attentive but difficult at times to redirect. His speech is  tangential with pressured speech at increased rate and tone. He is hyper verbal. He is grandiose and believes that he can be the Interior and spatial designer and run this mental health institution. He has flight of ideas. He is hyperreligious and delusional.  Discussed inpatient psychiatric admission and patient is in agreement.  He would like to be reevaluated because he does not believe that he  is truly diagnosed with schizoaffective disorder. He is adamant that he does not want to take medications.Patient is alert and oriented x 5.      Chief Complaint:  Chief Complaint  Patient presents with   Manic Behavior   Visit Diagnosis: Schizoaffective Disorder     CCA Screening, Triage and Referral (STR)  Patient Reported Information How did you hear about [place]? Self  What  Is the Reason for Your Visit/Call Today? Pt. is voluntary and arrived accompanied by his parents following an episode. He states that he is suicidal and is having SI, HI and AVH. Pt states that he is currently seeing the holy spirit and hearing commands of harming himself and other. Pt. had outbursts screaming for the lord saying that he wants to die and that he wants to be forgiven. Pt. denies alcohol or substance currently.  How Long Has This Been Causing You Problems? 1 wk - 1 month  What Do You Feel Would Help You the Most Today? Medication(s)   Have You Recently Had Any Thoughts About Hurting Yourself? No  Are You Planning to Commit Suicide/Harm Yourself At This time? No   Flowsheet Row ED from 12/03/2023 in [facility] Admission (Discharged) from 02/23/2023 in [facility] ED from 02/22/2023 in [facility]  C-SSRS RISK CATEGORY High Risk No Risk No Risk       Have you Recently Had Thoughts About Hurting Someone Nathan Holloway? No  Are You Planning to Harm Someone at This Time? No  Explanation: NA   Have You Used Any Alcohol or Drugs in the Past 24 Hours? No  How Long Ago Did You Use Drugs or Alcohol? NA What Did You Use and How Much? NA   Do You Currently Have a Therapist/Psychiatrist? No  Name of Therapist/Psychiatrist: NA   Have You Been Recently Discharged From Any Office Practice or Programs? No  Explanation of Discharge From Practice/Program: NA     CCA Screening Triage Referral Assessment Type of Contact: Face-to-Face  Telemedicine Service Delivery: face to face   Is this Initial or Reassessment? initial  Date Telepsych consult ordered in [facility]:  12/03/2023  Time Telepsych consult ordered in [facility]:  12/03/2023  Location of Assessment: [facility] Assessment Services  Provider Location: [facility] Assessment Services   Collateral Involvement: Mother who was present   Does Patient Have a  Automotive engineer Guardian? No Mother  Legal Guardian Contact Information: NA  Copy of Legal Guardianship Form: -- (NA)  Legal Guardian Notified of Arrival: -- (NA)  Legal Guardian Notified of Pending Discharge: -- (NA)  If Minor and Not Living with Parent(s), Who has Custody? NA  Is CPS involved or ever been involved? Never  Is APS involved or ever been involved? Never   Patient Determined To Be At Risk for Harm To Self or Others Based on Review of Patient Reported Information or Presenting Complaint? No  Method: No Plan  Availability of Means: No access or NA  Intent: Vague intent or NA  Notification Required: No need or identified person  Additional Information for Danger to Others Potential: -- (NA)  Additional Comments for Danger to Others Potential: None noted  Are There Guns or Other Weapons in Your Home? No  Types of Guns/Weapons: NA  Are These Weapons Safely Secured?                            -- (  NA)  Who Could Verify You Are Able To Have These Secured: NA  Do You Have any Outstanding Charges, Pending Court Dates, Parole/Probation? Pt denies  Contacted To Inform of Risk of Harm To Self or Others: Other: Comment (NA)    Does Patient Present under Involuntary Commitment? No    [place] of Residence: [place]   Patient Currently Receiving the Following Services: Not Receiving Services   Determination of Need: Urgent (48 hours)   Options For Referral: Inpatient Hospitalization     CCA Biopsychosocial Patient Reported Schizophrenia/Schizoaffective Diagnosis in Past: Yes   Strengths: Pt is willing to participate in treatment   Mental Health Symptoms Depression:  Difficulty Concentrating; Change in energy/activity (hypersomnia)   Duration of Depressive symptoms: Duration of Depressive Symptoms: Greater than two weeks   Mania:  Racing thoughts; Change in energy/activity; Increased Energy; Overconfidence; Recklessness; Euphoria;  Irritability   Anxiety:   Restlessness; Worrying   Psychosis:  None   Duration of Psychotic symptoms: Duration of Psychotic Symptoms: Less than six months   Trauma:  None (flashbacks at times about things that didn't happen)   Obsessions:  None (religious preoccupation during a "mania" state)   Compulsions:  None   Inattention:  None   Hyperactivity/Impulsivity:  None   Oppositional/Defiant Behaviors:  None   Emotional Irregularity:  None   Other Mood/Personality Symptoms:  None noted    Mental Status Exam Appearance and self-care  Stature:  Average   Weight:  Average weight   Clothing:  Casual   Grooming:  Normal   Cosmetic use:  None   Posture/gait:  Bizarre   Motor activity:  Repetitive; Restless   Sensorium  Attention:  Distractible   Concentration:  Anxiety interferes; Preoccupied   Orientation:  X5   Recall/memory:  Normal   Affect and Mood  Affect:  Full Range   Mood:  Hypomania (pleasant)   Relating  Eye contact:  Normal   Facial expression:  Responsive; Anxious   Attitude toward examiner:  Cooperative   Thought and Language  Speech flow: Flight of Ideas; Pressured   Thought content:  Appropriate to Mood and Circumstances   Preoccupation:  Religion   Hallucinations:  None   Organization:  Insurance underwriter of Knowledge:  Good   Intelligence:  Average   Abstraction:  Normal   Judgement:  Fair   Brewing technologist   Insight:  Gaps   Decision Making:  Vacilates   Social Functioning  Social Maturity:  Responsible   Social Judgement:  Normal   Stress  Stressors:  Family conflict   Coping Ability:  Human resources officer Deficits:  None   Supports:  Family; Friends/Service system     Religion: Religion/Spirituality Are You A Religious Person?: Yes What is Your Religious Affiliation?: Christian How Might This Affect Treatment?: Pt is hyper religious  Leisure/Recreation: Leisure /  Recreation Do You Have Hobbies?: Yes Leisure and Hobbies: Pt states he has a Editor, commissioning in Administrator, sports"  Exercise/Diet: Exercise/Diet Do You Exercise?: Yes What Type of Exercise Do You Do?: Weight Training How Many Times a Week Do You Exercise?: 1-3 times a week Have You Gained or Lost A Significant Amount of Weight in the Past Six Months?: Yes-Gained Number of Pounds Gained: 10 Do You Follow a Special Diet?: No Do You Have Any Trouble Sleeping?: Yes (pt reports current hypersomnia) Explanation of Sleeping Difficulties: Pt has not been sleeping for the last 48 hours   CCA Employment/Education  Employment/Work Situation: Employment / Work Situation Employment Situation: Unemployed Patient's Job has Been Impacted by Current Illness: No Has Patient ever Been in Equities trader?: No  Education: Education Is Patient Currently Attending School?: No Last Grade Completed: 12 Did You Product manager?: Yes What Type of College Degree Do you Have?: Liberal arts Did You Have An Individualized Education Program (IIEP): No Did You Have Any Difficulty At [employer]?: No Patient's Education Has Been Impacted by Current Illness: No   CCA Family/Childhood History Family and Relationship History: Family history Marital status: Single Does patient have children?: No  Childhood History:  Childhood History By whom was/is the patient raised?: Mother Did patient suffer any verbal/emotional/physical/sexual abuse as a child?: No Did patient suffer from severe childhood neglect?: No Has patient ever been sexually abused/assaulted/raped as an adolescent or adult?: No Was the patient ever a victim of a crime or a disaster?: No Witnessed domestic violence?: No Has patient been affected by domestic violence as an adult?: No       CCA Substance Use Alcohol/Drug Use: Alcohol / Drug Use Pain Medications: SEE MAR Prescriptions: SEE MAR Over the Counter: SEE MAR History of alcohol / drug use?: No  history of alcohol / drug abuse Longest period of sobriety (when/how long): 1 year Negative Consequences of Use:  (NA) Withdrawal Symptoms:  (NA)                         ASAM's:  Six Dimensions of Multidimensional Assessment  Dimension 1:  Acute Intoxication and/or Withdrawal Potential:   Dimension 1:  Description of individual's past and current experiences of substance use and withdrawal: NA  Dimension 2:  Biomedical Conditions and Complications:   Dimension 2:  Description of patient's biomedical conditions and  complications: NA  Dimension 3:  Emotional, Behavioral, or Cognitive Conditions and Complications:  Dimension 3:  Description of emotional, behavioral, or cognitive conditions and complications: NA  Dimension 4:  Readiness to Change:  Dimension 4:  Description of Readiness to Change criteria: NA  Dimension 5:  Relapse, Continued use, or Continued Problem Potential:  Dimension 5:  Relapse, continued use, or continued problem potential critiera description: NA  Dimension 6:  Recovery/Living Environment:  Dimension 6:  Recovery/Iiving environment criteria description: NA  ASAM Severity Score:    ASAM Recommended Level of Treatment: ASAM Recommended Level of Treatment:  (NA)   Substance use Disorder (SUD) Substance Use Disorder (SUD)  Checklist Symptoms of Substance Use:  (NA)  Recommendations for Services/Supports/Treatments: Recommendations for Services/Supports/Treatments Recommendations For Services/Supports/Treatments:  (NA)  Disposition Recommendation per psychiatric provider: We recommend inpatient psychiatric hospitalization when medically cleared. Patient is under voluntary admission status at this time; please IVC if attempts to leave hospital.   DSM5 Diagnoses: Patient Active Problem List   Diagnosis Date Noted   Paranoid ideation (HCC) 02/22/2023   Anxiety state 04/09/2022   Insomnia 04/09/2022   Hypertension 08/11/2020   High risk medication use  08/11/2020   Nicotine abuse 08/11/2020   Schizoaffective disorder, bipolar type (HCC) 07/11/2020     Referrals to Alternative Service(s): Referred to Alternative Service(s):   Place:   Date:   Time:    Referred to Alternative Service(s):   Place:   Date:   Time:    Referred to Alternative Service(s):   Place:   Date:   Time:    Referred to Alternative Service(s):   Place:   Date:   Time:     Ansel Bass,  LCAS

## 2023-12-03 NOTE — Discharge Instructions (Signed)
 Transfer to [facility] for IP admission, Dr. Zouev is the accepting md

## 2023-12-03 NOTE — ED Notes (Signed)
 Pt moved to flex d/t escalating manic behaviors and loud speech.

## 2023-12-03 NOTE — ED Notes (Signed)
 Pt conversing with another pt at this time. Calm and cooperative at this time. Will continue to monitor.

## 2023-12-04 ENCOUNTER — Other Ambulatory Visit: Payer: Self-pay

## 2023-12-04 ENCOUNTER — Inpatient Hospital Stay (HOSPITAL_COMMUNITY)
Admission: AD | Admit: 2023-12-04 | Discharge: 2023-12-10 | DRG: 885 | Disposition: A | Discharge status: Home / Self Care | Source: Intra-hospital | Attending: Psychiatry | Admitting: Psychiatry

## 2023-12-04 ENCOUNTER — Encounter (HOSPITAL_COMMUNITY): Payer: Self-pay | Admitting: Psychiatry

## 2023-12-04 DIAGNOSIS — F419 Anxiety disorder, unspecified: Secondary | ICD-10-CM | POA: Diagnosis present

## 2023-12-04 DIAGNOSIS — I10 Essential (primary) hypertension: Secondary | ICD-10-CM | POA: Diagnosis present

## 2023-12-04 DIAGNOSIS — Z79899 Other long term (current) drug therapy: Secondary | ICD-10-CM

## 2023-12-04 DIAGNOSIS — F600 Paranoid personality disorder: Secondary | ICD-10-CM | POA: Diagnosis present

## 2023-12-04 DIAGNOSIS — F250 Schizoaffective disorder, bipolar type: Secondary | ICD-10-CM | POA: Diagnosis present

## 2023-12-04 DIAGNOSIS — Z91014 Allergy to mammalian meats: Secondary | ICD-10-CM | POA: Diagnosis not present

## 2023-12-04 DIAGNOSIS — Z91148 Patient's other noncompliance with medication regimen for other reason: Secondary | ICD-10-CM

## 2023-12-04 DIAGNOSIS — F17290 Nicotine dependence, other tobacco product, uncomplicated: Secondary | ICD-10-CM | POA: Diagnosis present

## 2023-12-04 DIAGNOSIS — Z9151 Personal history of suicidal behavior: Secondary | ICD-10-CM

## 2023-12-04 DIAGNOSIS — Z560 Unemployment, unspecified: Secondary | ICD-10-CM

## 2023-12-04 DIAGNOSIS — G4700 Insomnia, unspecified: Secondary | ICD-10-CM | POA: Diagnosis present

## 2023-12-04 DIAGNOSIS — Z91011 Allergy to milk products: Secondary | ICD-10-CM | POA: Diagnosis not present

## 2023-12-04 DIAGNOSIS — Z888 Allergy status to other drugs, medicaments and biological substances status: Secondary | ICD-10-CM

## 2023-12-04 LAB — POCT URINE DRUG SCREEN - MANUAL ENTRY (I-SCREEN)
POC Amphetamine UR: NOT DETECTED
POC Buprenorphine (BUP): NOT DETECTED
POC Cocaine UR: NOT DETECTED
POC Marijuana UR: NOT DETECTED
POC Methadone UR: NOT DETECTED
POC Methamphetamine UR: NOT DETECTED
POC Morphine: NOT DETECTED
POC Oxazepam (BZO): POSITIVE — AB
POC Oxycodone UR: NOT DETECTED
POC Secobarbital (BAR): NOT DETECTED

## 2023-12-04 MED ORDER — MAGNESIUM HYDROXIDE 400 MG/5ML PO SUSP: 30.0000 mL | Freq: Every day | ORAL | Status: DC | PRN

## 2023-12-04 MED ORDER — OLANZAPINE 10 MG IM SOLR: 5.0000 mg | Freq: Three times a day (TID) | INTRAMUSCULAR | Status: DC | PRN

## 2023-12-04 MED ORDER — OLANZAPINE 5 MG PO TABS
5.0000 mg | ORAL_TABLET | Freq: Two times a day (BID) | ORAL | Status: DC
  Administered 2023-12-04 – 2023-12-05 (×2): 5 mg via ORAL
  Filled 2023-12-04 (×6): qty 1

## 2023-12-04 MED ORDER — ALUM & MAG HYDROXIDE-SIMETH 200-200-20 MG/5ML PO SUSP: 30.0000 mL | ORAL | Status: DC | PRN

## 2023-12-04 MED ORDER — OLANZAPINE 10 MG IM SOLR: 10.0000 mg | Freq: Three times a day (TID) | INTRAMUSCULAR | Status: DC | PRN

## 2023-12-04 MED ORDER — ACETAMINOPHEN 325 MG PO TABS
650.0000 mg | ORAL_TABLET | Freq: Four times a day (QID) | ORAL | Status: DC | PRN
  Administered 2023-12-04 – 2023-12-08 (×5): 650 mg via ORAL
  Filled 2023-12-04 (×7): qty 2

## 2023-12-04 MED ORDER — HYDROXYZINE HCL 25 MG PO TABS
25.0000 mg | ORAL_TABLET | Freq: Three times a day (TID) | ORAL | Status: DC | PRN
  Administered 2023-12-05 – 2023-12-08 (×5): 25 mg via ORAL
  Filled 2023-12-04 (×5): qty 1

## 2023-12-04 MED ORDER — OLANZAPINE 5 MG PO TBDP: 5.0000 mg | ORAL_TABLET | Freq: Three times a day (TID) | ORAL | Status: DC | PRN

## 2023-12-04 NOTE — ED Notes (Signed)
 Pt eating lunch now, no concerns at this time. No s/sx of distress.

## 2023-12-04 NOTE — ED Notes (Signed)
 Safe transport called & arranged

## 2023-12-04 NOTE — ED Notes (Signed)
 Pt observed/assessed in recliner sleeping. RR even and unlabored, appearing in no noted distress. Environmental check complete, will continue to monitor for safety
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
## 2023-12-04 NOTE — [facility] Group Notes (Signed)

## 2023-12-04 NOTE — [facility] Group Notes (Signed)
 [facility] Group Notes:  (Nursing/MHT/Case Management/Adjunct)  Date:  12/04/2023  Time:  9:24 PM  Type of Therapy:  Group Therapy  Participation Level:  Did Not Attend  Participation Quality:   None  Affect:   None  Cognitive:   None  Insight:  None  Engagement in Group:  None  Modes of Intervention:   None  Summary of Progress/Problems: Wrap up group pt. did not attend  Anniece Kind 12/04/2023, 9:24 PM

## 2023-12-04 NOTE — Tx Team (Signed)
 Initial Treatment Plan 12/04/2023 2:46 PM [name] [id number]    PATIENT STRESSORS: Medication change or noncompliance     PATIENT STRENGTHS: Active sense of humor  Communication skills  General fund of knowledge  Motivation for treatment/growth  Physical Health  Religious Affiliation  Supportive family/friends    PATIENT IDENTIFIED PROBLEMS: Medication management  Wants reevaluation of diagnosis  Medication non compliance  Hyper religious               DISCHARGE CRITERIA:  Improved stabilization in mood, thinking, and/or behavior  PRELIMINARY DISCHARGE PLAN: Return to previous living arrangement  PATIENT/FAMILY INVOLVEMENT: This treatment plan has been presented to and reviewed with the patient, Nathan Holloway.  The patient has been given the opportunity to ask questions and make suggestions.  Christinia Cradle, RN 12/04/2023, 2:46 PM

## 2023-12-04 NOTE — ED Provider Notes (Signed)
 FBC/OBS ASAP Discharge Summary  Date and Time: 12/04/2023 9:40 AM  Name: Nathan Holloway  MRN:  [id number]   Discharge Diagnoses:  Final diagnoses:  Schizoaffective disorder, bipolar type [facility])  HPI: patient presented to [facility] as a walk in  accompanied by his mother with complaints of manic behavior.  He was admitted to the continuous assessment unit while awaiting inpatient psychiatric bed availability   Nathan Holloway, 28 y.o., male patient seen face to face by this provider, consulted with Dr. Enedina Finner; and chart reviewed on 12/04/23.  Per chart review patient has a past psychiatric history of schizoaffective disorder bipolar type.  He has a history of multiple inpatient psychiatric admissions with his last admission being at [facility] on 02/2024.     Subjective:   Currently on assessment patient is walking around the unit.  He is pleasant upon approach.  He continues to deny any depression and has a euphoric affect.  His speech is clear, coherent but at a increased rate and tone.  He is hyper verbal and hyperreligious.  He is grandiose and displays flight of ideas.  He is also a little delusional and believes that he is a peer support specialist here at the hospital.  He denies suicidal and homicidal ideations.  He denies auditory/visual hallucinations.  He does not appear manic.  However he does not appear to be responding to internal/external stimuli.  Stay Summary:   Patient has been compliant with scheduled medications. Patient recommended for inpatient psychiatric admission and has been accepted to [facility].   Total Time spent with patient: 20 minutes  Past Psychiatric History: see H&P Past Medical History: see H&P Family History: see H&P Family Psychiatric History: see H&P Social History: see H&P Tobacco Cessation:  N/A, patient does not currently use tobacco products  Current Medications:  Current Facility-Administered Medications  Medication Dose Route Frequency Provider Last  Rate Last Admin   acetaminophen (TYLENOL) tablet 650 mg  650 mg Oral Q6H PRN Ardis Hughs, NP       alum & mag hydroxide-simeth (MAALOX/MYLANTA) 200-200-20 MG/5ML suspension 30 mL  30 mL Oral Q4H PRN Ardis Hughs, NP       hydrOXYzine (ATARAX) tablet 25 mg  25 mg Oral TID PRN Ardis Hughs, NP   25 mg at 12/03/23 2132   magnesium hydroxide (MILK OF MAGNESIA) suspension 30 mL  30 mL Oral Daily PRN Ardis Hughs, NP       OLANZapine (ZYPREXA) injection 10 mg  10 mg Intramuscular TID PRN Ardis Hughs, NP       OLANZapine (ZYPREXA) injection 5 mg  5 mg Intramuscular TID PRN Ardis Hughs, NP       OLANZapine (ZYPREXA) tablet 5 mg  5 mg Oral BID Ardis Hughs, NP   5 mg at 12/04/23 0907   OLANZapine zydis (ZYPREXA) disintegrating tablet 5 mg  5 mg Oral TID PRN Ardis Hughs, NP   5 mg at 12/03/23 1648   traZODone (DESYREL) tablet 50 mg  50 mg Oral QHS PRN Ardis Hughs, NP   50 mg at 12/03/23 2132   Current Outpatient Medications  Medication Sig Dispense Refill   hydrOXYzine (ATARAX) 25 MG tablet Take 1 tablet (25 mg total) by mouth 3 (three) times daily as needed for anxiety. (Patient not taking: Reported on 12/03/2023) 30 tablet 0   OLANZapine (ZYPREXA) 5 MG tablet Take 1 tablet (5 mg total) by mouth 2 (two) times daily.  PTA Medications:  Facility Ordered Medications  Medication   acetaminophen (TYLENOL) tablet 650 mg   alum & mag hydroxide-simeth (MAALOX/MYLANTA) 200-200-20 MG/5ML suspension 30 mL   magnesium hydroxide (MILK OF MAGNESIA) suspension 30 mL   OLANZapine zydis (ZYPREXA) disintegrating tablet 5 mg   OLANZapine (ZYPREXA) injection 5 mg   OLANZapine (ZYPREXA) injection 10 mg   traZODone (DESYREL) tablet 50 mg   hydrOXYzine (ATARAX) tablet 25 mg   OLANZapine (ZYPREXA) tablet 5 mg   [COMPLETED] LORazepam (ATIVAN) tablet 1 mg   PTA Medications  Medication Sig   hydrOXYzine (ATARAX) 25 MG tablet Take 1 tablet (25 mg total) by  mouth 3 (three) times daily as needed for anxiety. (Patient not taking: Reported on 12/03/2023)       05/17/2022    1:00 PM 05/03/2022    3:41 PM 04/07/2022    7:28 PM  Depression screen PHQ 2/9  Decreased Interest 1 1 0  Down, Depressed, Hopeless 0 0 2  PHQ - 2 Score 1 1 2   Altered sleeping 3  2  Tired, decreased energy 3  0  Change in appetite 3  2  Feeling bad or failure about yourself  1  0  Trouble concentrating 1  1  Moving slowly or fidgety/restless 0  2  Suicidal thoughts 0  0  PHQ-9 Score 12  9  Difficult doing work/chores Somewhat difficult  Very difficult    Flowsheet Row ED from 12/03/2023 in [facility] ([facility]22/2023 in [facility]  C-SSRS RISK CATEGORY High Risk No Risk No Risk       Musculoskeletal  Strength & Muscle Tone: within normal limits Gait & Station: normal Patient leans: N/A  Psychiatric Specialty Exam  Presentation  General Appearance:  Casual  Eye Contact: Fleeting  Speech: Pressured; Clear and Coherent  Speech Volume: Increased  Handedness: Right   Mood and Affect  Mood: Euphoric  Affect: Congruent   Thought Process  Thought Processes: Coherent  Descriptions of Associations:Tangential  Orientation:Full (Time, Place and Person)  Thought Content:Scattered; Tangential  Diagnosis of Schizophrenia or Schizoaffective disorder in past: Yes  Duration of Psychotic Symptoms: Greater than six months   Hallucinations:Hallucinations: None Description of Auditory Hallucinations: Hears the voice of the [facility] Description of Visual Hallucinations: he saw the enemy last night states it was a lot of shadows moving around his room  Ideas of Reference:None  Suicidal Thoughts:Suicidal Thoughts: No  Homicidal Thoughts:Homicidal Thoughts: No   Sensorium  Memory: Immediate Fair;  Recent Fair; Remote Fair  Judgment: Impaired  Insight: Lacking   Executive Functions  Concentration: Poor  Attention Span: Poor  Recall: Poor  Fund of Knowledge: Poor  Language: Poor   Psychomotor Activity  Psychomotor Activity: Psychomotor Activity: Increased   Assets  Assets: Physical Health; Resilience; Social Support   Sleep  Sleep: Sleep: Poor Number of Hours of Sleep: 0   Nutritional Assessment (For OBS and FBC admissions only) Has the patient had a weight loss or gain of 10 pounds or more in the last 3 months?: No Has the patient had a decrease in food intake/or appetite?: Yes Does the patient have dental problems?: No Does the patient have eating habits or behaviors that may be indicators of an eating disorder including binging or inducing vomiting?: No Has the patient recently lost weight without trying?: 2.0 Has the patient been eating poorly because of a decreased  appetite?: 1 Malnutrition Screening Tool Score: 3    Physical Exam  Physical Exam Constitutional:      Appearance: Normal appearance.  Eyes:     General:        Right eye: No discharge.        Left eye: No discharge.  Cardiovascular:     Rate and Rhythm: Normal rate.  Pulmonary:     Effort: No respiratory distress.  Musculoskeletal:        General: Normal range of motion.     Cervical back: Normal range of motion.  Neurological:     Mental Status: He is alert and oriented to person, place, and time.  Psychiatric:        Attention and Perception: He is inattentive.        Mood and Affect: Mood is elated.        Speech: Speech is rapid and pressured and tangential.        Behavior: Behavior is cooperative.        Thought Content: Thought content is delusional.        Cognition and Memory: Cognition normal.        Judgment: Judgment is impulsive.    Review of Systems  Constitutional:  Negative for chills and fever.  HENT:  Negative for hearing loss.   Respiratory:   Negative for cough and shortness of breath.   Cardiovascular:  Negative for chest pain.  Musculoskeletal: Negative.   Neurological:  Negative for tremors.  Psychiatric/Behavioral:  The patient has insomnia.    Blood pressure 116/89, pulse 89, temperature 98.3 F (36.8 C), temperature source Oral, resp. rate 16, SpO2 100%. There is no height or weight on file to calculate BMI.    Disposition: Pt accepted to [facility] for ip admission   Costella Dirks, NP 12/04/2023, 9:40 AM

## 2023-12-04 NOTE — ED Notes (Signed)
 Pt being transported to accepting facility now via [facility] transport. All belongings given to transport service. Pt remains stable, calm and cooperative. No s/sx of distress. Any questions regarding transfer answered. All paperwork present in folder, also being given to transport service. No further concerns.

## 2023-12-04 NOTE — Plan of Care (Signed)

## 2023-12-04 NOTE — ED Notes (Signed)
 Pt awake, sitting up in bed. No concerns or s/sx of distress at this time.

## 2023-12-05 DIAGNOSIS — F250 Schizoaffective disorder, bipolar type: Secondary | ICD-10-CM | POA: Diagnosis not present

## 2023-12-05 LAB — VITAMIN D 25 HYDROXY (VIT D DEFICIENCY, FRACTURES): Vit D, 25-Hydroxy: 42.52 ng/mL (ref 30–100)

## 2023-12-05 LAB — VITAMIN B12: Vitamin B-12: 486 pg/mL (ref 180–914)

## 2023-12-05 MED ORDER — PROPRANOLOL HCL 10 MG PO TABS
10.0000 mg | ORAL_TABLET | Freq: Two times a day (BID) | ORAL | Status: DC
  Administered 2023-12-05 – 2023-12-10 (×10): 10 mg via ORAL
  Filled 2023-12-05 (×18): qty 1

## 2023-12-05 MED ORDER — ARIPIPRAZOLE 10 MG PO TABS
10.0000 mg | ORAL_TABLET | Freq: Every day | ORAL | Status: DC
Start: 2023-12-06 — End: 2023-12-09
  Administered 2023-12-06 – 2023-12-09 (×4): 10 mg via ORAL
  Filled 2023-12-05 (×6): qty 1

## 2023-12-05 MED ORDER — TRAZODONE HCL 50 MG PO TABS
50.0000 mg | ORAL_TABLET | Freq: Every evening | ORAL | Status: DC | PRN
  Administered 2023-12-05 – 2023-12-07 (×3): 50 mg via ORAL
  Filled 2023-12-05 (×3): qty 1

## 2023-12-05 MED ORDER — IBUPROFEN 400 MG PO TABS
400.0000 mg | ORAL_TABLET | Freq: Four times a day (QID) | ORAL | Status: DC | PRN
  Administered 2023-12-05 – 2023-12-06 (×2): 400 mg via ORAL
  Filled 2023-12-05 (×3): qty 1

## 2023-12-05 MED ORDER — ARIPIPRAZOLE 5 MG PO TABS
5.0000 mg | ORAL_TABLET | Freq: Once | ORAL | Status: AC
  Administered 2023-12-05: 5 mg via ORAL
  Filled 2023-12-05 (×2): qty 1

## 2023-12-05 MED ORDER — CLONIDINE HCL 0.1 MG PO TABS
0.1000 mg | ORAL_TABLET | ORAL | Status: AC
  Administered 2023-12-05: 0.1 mg via ORAL
  Filled 2023-12-05 (×2): qty 1

## 2023-12-05 MED ORDER — DIVALPROEX SODIUM 500 MG PO DR TAB
500.0000 mg | DELAYED_RELEASE_TABLET | Freq: Two times a day (BID) | ORAL | Status: DC
  Administered 2023-12-05 – 2023-12-10 (×10): 500 mg via ORAL
  Filled 2023-12-05 (×16): qty 1

## 2023-12-05 NOTE — Group Note (Signed)
 Date:  12/05/2023 Time:  9:53 AM  Group Topic/Focus:  Goals Group:   The focus of this group is to help patients establish daily goals to achieve during treatment and discuss how the patient can incorporate goal setting into their daily lives to aide in recovery. Orientation:   The focus of this group is to educate the patient on the purpose and policies of crisis stabilization and provide a format to answer questions about their admission.  The group details unit policies and expectations of patients while admitted.    Participation Level:  Did Not Attend  Additional Comments:  Did not attend.  Nathan Holloway 12/05/2023, 9:53 AM

## 2023-12-05 NOTE — Progress Notes (Signed)
   12/05/23 2300  Psych Admission Type (Psych Patients Only)  Admission Status Voluntary  Psychosocial Assessment  Patient Complaints Hyperactivity;Anxiety  Eye Contact Fair  Facial Expression Animated;Anxious  Affect Anxious;Euphoric  Speech Rapid;Pressured  Interaction Assertive  Motor Activity Hyperactive;Restless  Appearance/Hygiene Disheveled  Behavior Characteristics Cooperative  Mood Pleasant  Thought Process  Coherency Disorganized;Flight of ideas  Content Religiosity  Delusions Religious  Hallucination None reported or observed  Judgment Impaired  Confusion None  Danger to Self  Current suicidal ideation? Denies  Self-Injurious Behavior No self-injurious ideation or behavior indicators observed or expressed   Agreement Not to Harm Self Yes  Description of Agreement verbal  Danger to Others  Danger to Others None reported or observed

## 2023-12-05 NOTE — Progress Notes (Signed)
   12/04/23 2300  Psych Admission Type (Psych Patients Only)  Admission Status Voluntary  Psychosocial Assessment  Patient Complaints Hyperactivity;Anxiety  Eye Contact Fair  Facial Expression Anxious  Affect Anxious  Speech Rapid;Pressured  Interaction Assertive  Motor Activity Restless;Hyperactive  Appearance/Hygiene Unremarkable  Behavior Characteristics Hyperactive  Thought Process  Coherency Disorganized;Flight of ideas  Content Religiosity  Delusions Controlling;Paranoid  Perception Depersonalization  Hallucination Auditory  Judgment Impaired  Confusion None  Danger to Self  Current suicidal ideation? Denies  Self-Injurious Behavior No self-injurious ideation or behavior indicators observed or expressed   Agreement Not to Harm Self Yes  Description of Agreement verbal  Danger to Others  Danger to Others None reported or observed

## 2023-12-05 NOTE — Plan of Care (Signed)

## 2023-12-05 NOTE — Group Note (Signed)
 Date:  12/05/2023 Time:  9:01 PM  Group Topic/Focus:  Wrap-Up Group:   The focus of this group is to help patients review their daily goal of treatment and discuss progress on daily workbooks.    Participation Level:  Active  Participation Quality:  Appropriate and Attentive  Affect:  Appropriate  Cognitive:  Alert and Appropriate  Insight: Appropriate and Good  Engagement in Group:  Engaged  Modes of Intervention:  Discussion and Education  Additional Comments:  Pt attended and participated in wrap up group this evening and rated their day a 10/10. 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 While they are her, pt would benefit from med changes and is requesting that their diagnosis be reevaluated.   Eligah Grow 12/05/2023, 9:01 PM
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
## 2023-12-05 NOTE — Progress Notes (Signed)
   12/05/23 0645  15 Minute Checks  Location Cafeteria  Visual Appearance Calm  Behavior Composed

## 2023-12-05 NOTE — Group Note (Signed)
 LCSW Group Therapy Note   Group Date: 12/05/2023 Start Time: 1100 End Time: 1200  Participation:  did not attend  Type of Therapy:  Group Therapy  Topic:  Stronger Together:  Building Healthy Relationships   Objective:  To explore loneliness, boundaries, and safe ways to build relationships.  Goals: Recognize healthy vs. unhealthy relationships. Learn safe ways to connect with others. Strengthen communication and [employer].  Summary:  Participants discussed loneliness, healthy connections, and setting boundaries. They explored safe ways to meet people and shared personal experiences. Key insights were reinforced through discussion and quotes.  Therapeutic Modalities Used: Cognitive Behavioral Therapy (CBT) Elements - Identifying unhealthy relationship patterns, challenging negative thoughts about connection. Dialectical Behavior Therapy (DBT) Elements - Interpersonal effectiveness, setting and maintaining boundaries. Supportive Group Therapy - Peer discussion, shared experiences, and emotional validation.   Amerigo Mcglory O Raechal Raben, LCSWA 12/05/2023  6:10 PM

## 2023-12-05 NOTE — Progress Notes (Signed)
 D: Patient is alert, pleasant, and cooperative. Denies SI, HI, AVH, and verbally contracts for safety. Patient reports left foot pain.   A: Scheduled medications administered per MD order. PRN tylenol, ibuprofen, and hydroxyzine administered. Support provided. Patient educated on safety on the unit and medications. Routine safety checks every 15 minutes. Patient stated understanding to tell nurse about any new physical symptoms. Patient understands to tell staff of any needs.     R: No adverse drug reactions noted. Patient remains safe at this time and will continue to monitor.    12/05/23 1000  Psych Admission Type (Psych Patients Only)  Admission Status Voluntary  Psychosocial Assessment  Patient Complaints Anxiety;Restlessness  Eye Contact Fair  Facial Expression Animated  Affect Euphoric  Speech Rapid;Pressured  Interaction Assertive;Flirtatious  Motor Activity Restless  Appearance/Hygiene Unremarkable  Behavior Characteristics Cooperative  Mood Pleasant  Thought Process  Coherency Disorganized;Flight of ideas  Content Religiosity  Delusions Religious  Perception Derealization  Hallucination None reported or observed  Judgment Impaired  Confusion None  Danger to Self  Current suicidal ideation? Denies  Self-Injurious Behavior No self-injurious ideation or behavior indicators observed or expressed   Agreement Not to Harm Self Yes  Description of Agreement verbal  Danger to Others  Danger to Others None reported or observed

## 2023-12-05 NOTE — BHH Suicide Risk Assessment (Signed)
 Suicide Risk Assessment  Admission Assessment    [facility] Admission Suicide Risk Assessment   Nursing information obtained from:  Patient Demographic factors:  Male, Adolescent or young adult Current Mental Status:  NA Loss Factors:  NA Historical Factors:  Impulsivity Risk Reduction Factors:  Sense of responsibility to family, Living with another person, especially a relative  Total Time spent with patient: 1.5 hours Principal Problem: Schizoaffective disorder, bipolar type (HCC) Diagnosis:  Principal Problem:   Schizoaffective disorder, bipolar type (HCC)  Subjective Data: Nathan Holloway is a 28 year old African-American male with prior psychiatric diagnoses significant for schizoaffective disorder bipolar type high risk medication use nicotine abuse anxiety states insomnia paranoid ideation who presents voluntarily to [facility] from [facility] behavioral health urgent care for worsening mood disorder in the context of cycling manic behavior.  Continued Clinical Symptoms:  Alcohol Use Disorder Identification Test Final Score (AUDIT): 0 The "Alcohol Use Disorders Identification Test", Guidelines for Use in Primary Care, Second Edition.  World Science writer [facility]). Score between 0-7:  no or low risk or alcohol related problems. Score between 8-15:  moderate risk of alcohol related problems. Score between 16-19:  high risk of alcohol related problems. Score 20 or above:  warrants further diagnostic evaluation for alcohol dependence and treatment.  CLINICAL FACTORS:   Severe Anxiety and/or Agitation Bipolar Disorder:   Mixed State Depression:   Delusional Hopelessness Severe Chronic Pain More than one psychiatric diagnosis Previous Psychiatric Diagnoses and Treatments Medical Diagnoses and Treatments/Surgeries  Musculoskeletal: Strength & Muscle Tone: within normal limits Gait & Station: normal Patient leans: N/A  Psychiatric Specialty  Exam:  Presentation  General Appearance:  Bizarre; Casual  Eye Contact: Fair  Speech: Clear and Coherent  Speech Volume: Normal  Handedness: Right   Mood and Affect  Mood: Euphoric (Hyper religious)  Affect: Congruent  Thought Process  Thought Processes: Coherent  Descriptions of Associations:Intact  Orientation:Full (Time, Place and Person)  Thought Content:Scattered; Tangential  History of Schizophrenia/Schizoaffective disorder:Yes  Duration of Psychotic Symptoms:Greater than six months  Hallucinations:Hallucinations: None Description of Auditory Hallucinations: He has voices from the [facility] inside him saying to be still Description of Visual Hallucinations: Denies  Ideas of Reference:None  Suicidal Thoughts:Suicidal Thoughts: No  Homicidal Thoughts:Homicidal Thoughts: No  Sensorium  Memory: Immediate Fair; Recent Fair  Judgment: Impaired  Insight: Lacking  Executive Functions  Concentration: Fair  Attention Span: Fair  Recall: Poor  Fund of Knowledge: Poor  Language: Fair  Psychomotor Activity  Psychomotor Activity: Psychomotor Activity: Increased  Assets  Assets: Physical Health; Resilience  Sleep  Sleep: Sleep: Poor Number of Hours of Sleep: 3  Physical Exam: Physical Exam Constitutional:      Appearance: He is normal weight.  HENT:     Head: Normocephalic.     Right Ear: External ear normal.     Left Ear: External ear normal.     Nose: Nose normal.     Mouth/Throat:     Mouth: Mucous membranes are moist.     Pharynx: Oropharynx is clear.  Eyes:     Extraocular Movements: Extraocular movements intact.  Cardiovascular:     Rate and Rhythm: Tachycardia present.     Comments: Blood pressure 151/90, pulse 101, patient remains asymptomatic.  Nursing staff to recheck vital signs Pulmonary:     Effort: Pulmonary effort is normal.  Abdominal:     Comments: Deferred  Genitourinary:    Comments:  Deferred Musculoskeletal:  General: Normal range of motion.     Cervical back: Normal range of motion.  Skin:    General: Skin is warm.  Neurological:     General: No focal deficit present.     Mental Status: He is alert. He is disoriented.  Psychiatric:        Mood and Affect: Mood normal.        Behavior: Behavior normal.    Review of Systems  Constitutional:  Negative for chills and fever.  HENT:  Negative for sore throat.   Eyes:  Negative for blurred vision.  Respiratory:  Negative for cough, sputum production, shortness of breath and wheezing.   Cardiovascular:  Negative for chest pain and palpitations.  Gastrointestinal:  Negative for abdominal pain, constipation, diarrhea, heartburn, nausea and vomiting.  Genitourinary:  Negative for dysuria, frequency and urgency.  Musculoskeletal:  Negative for myalgias.  Skin:  Negative for itching and rash.  Neurological:  Negative for dizziness and headaches.  Endo/Heme/Allergies:        See allergy listing  Psychiatric/Behavioral:  Positive for depression. Negative for hallucinations, substance abuse and suicidal ideas. The patient is nervous/anxious.    Blood pressure (!) 151/90, pulse (!) 101, temperature (!) 97.4 F (36.3 C), temperature source Oral, resp. rate 20, height 5\' 9"  (1.753 m), weight 68.5 kg, SpO2 97%. Body mass index is 22.3 kg/m.  COGNITIVE FEATURES THAT CONTRIBUTE TO RISK:  Polarized thinking    SUICIDE RISK:   Severe:  Frequent, intense, and enduring suicidal ideation, specific plan, no subjective intent, but some objective markers of intent (i.e., choice of lethal method), the method is accessible, some limited preparatory behavior, evidence of impaired self-control, severe dysphoria/symptomatology, multiple risk factors present, and few if any protective factors, particularly a lack of social support.  PLAN OF CARE: Physician Treatment Plan for Primary Diagnosis:  Assessment: Nathan Holloway is a  28 year old African-American male with prior psychiatric diagnoses significant for schizoaffective disorder bipolar type high risk medication use nicotine abuse anxiety states insomnia paranoid ideation who presents voluntarily to [facility] from [facility] behavioral health urgent care for worsening mood disorder in the context of cycling manic behavior.  Schizoaffective disorder, bipolar type (HCC)  Plans: Medications: --Initiate Abilify 5 mg p.o. today.  Plan to increase to Abilify 10 mg starting 12/06/2023.  Plan is to discharge patient on Abilify LAI. --Initiate Depakote 500 mg p.o. twice daily.  Obtained Depakote level on 12/09/2023 --Continue hydroxyzine tablet 25 mg p.o. 3 times daily as needed for anxiety --Initiate trazodone 50 mg p.o. q. nightly as needed for insomnia --Initiate propranolol 10 mg p.o. twice daily for hypertension  Other PRN Medications  -Acetaminophen 650 mg every 6 as needed/mild pain  -Maalox 30 mL oral every 4 as needed/digestion  -Magnesium hydroxide 30 mL daily as needed/mild constipation   Continue BH Agitation Protocol  --Haldol 5 mg, oral, 3 times daily as needed, mild agitation  --Benadryl 50 mg, oral, 3 times daily as needed, mild agitation                                      OR   --Haldol injection 5 mg, IM, 3 times daily as needed, moderate agitation  --Benadryl injection 50 mg, IM, 3 times daily as needed, moderate agitation  --Ativan injection 2 mg, IM, 3 times daily as needed, moderate agitation  OR  --Haldol injection 10 mg, IM, 3 times daily as needed, severe agitation  --Benadryl injection 50 mg, IM, 3 times daily as needed, severe agitation  --Ativan injection 2 mg, IM, 3 times daily as needed, severe agitation   --  The risks/benefits/side-effects/alternatives to this medication were discussed in detail with the patient and time was given for questions. The patient consents  to medication trial.   -- Metabolic profile and EKG monitoring obtained while on an atypical antipsychotic (BMI: Lipid Panel: HbgA1c: QTc:)   -- Encouraged patient to participate in unit milieu and in scheduled group therapies    Admission labs reviewed: CMP: Creatinine 1.51 elevated, calcium 10.5 elevated, albumin 5.1 elevated, total bilirubin 8.9 elevated.  Otherwise normal.  Lipid profile: Cholesterol 218 elevated, LDL 134 elevated, otherwise normal.  CBC with differential was within normal limits.  BAL less than 10.  UDS: Positive for benzos.  Hemoglobin A1c: 4.2.  New labs ordered: Depakote level, vitamin B 12, vitamin D 25-hydroxy.  EKG reviewed: Normal sinus rhythm, ventricular rate 74, QT/QTc 348/380  Safety and Monitoring:  Voluntary admission to inpatient psychiatric unit for safety, stabilization and treatment  Daily contact with patient to assess and evaluate symptoms and progress in treatment  Patient's case to be discussed in multi-disciplinary team meeting  Observation Level : q15 minute checks  Vital signs: q12 hours  Precautions: suicide, but pt currently verbally contracts for safety on unit?   Discharge Planning:  Social work and case management to assist with discharge planning and identification of hospital follow-up needs prior to discharge  Estimated LOS: 5-7?days  Discharge Concerns: Need to establish a safety plan; Medication compliance and effectiveness  Discharge Goals: Return home with outpatient referrals for mental health follow-up including medication management/psychotherapy.   Long Term Goal(s): Improvement in symptoms so as ready for discharge  Short Term Goals: Ability to identify changes in lifestyle to reduce recurrence of condition will improve, Ability to verbalize feelings will improve, Ability to disclose and discuss suicidal ideas, Ability to demonstrate self-control will improve, Ability to identify and develop effective coping behaviors will  improve, Ability to maintain clinical measurements within normal limits will improve, Compliance with prescribed medications will improve, and Ability to identify triggers associated with substance abuse/mental health issues will improve  Physician Treatment Plan for Secondary Diagnosis: Principal Problem:   Schizoaffective disorder, bipolar type (HCC)  I certify that inpatient services furnished can reasonably be expected to improve the patient's condition.   Laurence Pons, FNP 12/05/2023, 1:31 PM

## 2023-12-05 NOTE — Progress Notes (Signed)
   12/05/23 0645  15 Minute Checks  Location Cafeteria  Visual Appearance Calm  Behavior Composed  Sleep (Behavioral Health Patients Only)  Calculate sleep? (Click Yes once per 24 hr at 0600 safety check) Yes  Documented sleep last 24 hours 3

## 2023-12-06 ENCOUNTER — Encounter (HOSPITAL_COMMUNITY): Payer: Self-pay

## 2023-12-06 DIAGNOSIS — F250 Schizoaffective disorder, bipolar type: Secondary | ICD-10-CM

## 2023-12-06 NOTE — Progress Notes (Signed)
 [facility] MD Progress Note  12/06/2023 2:44 PM Nathan Holloway  MRN:  [id number] Subjective:  Nathan Holloway states, "I feel better today.  My thought is not racing too bad." Principal Problem: Schizoaffective disorder, bipolar type (HCC) Diagnosis: Principal Problem:   Schizoaffective disorder, bipolar type (HCC)  Reason for admission:  Nathan Holloway is a 28 year old African-American male with prior psychiatric diagnoses significant for schizoaffective disorder bipolar type, high risk medication use, nicotine abuse, anxiety states, insomnia, paranoid ideation, and PMHx of hypertension, who presents voluntarily to [facility] from [facility] behavioral health urgent care for worsening mood disorder in the context of cycling manic behavior.   Yesterday the psychiatry team made the following recommendations: --Increase Abilify from 5 mg p.o. daily to 10 mg p.o. daily starting 12/06/2023.  Plan is to discharge patient on Abilify LAI. --Initiate Depakote 500 mg p.o. twice daily.  Obtained Depakote level on 12/09/2023 --Continue hydroxyzine tablet 25 mg p.o. 3 times daily as needed for anxiety --Initiate trazodone 50 mg p.o. q. nightly as needed for insomnia --Initiate propranolol 10 mg p.o. twice daily for hypertension  24 hours chart reviewed: Vital signs without critical values, patient continues on propranolol 10 mg p.o. twice daily for hypertension.  No agitation protocol required.  Patient is compliant at this time with his psychotropic medications.  Required Tylenol x 2 for mild foot pain hydroxyzine x 2 for anxiety and ibuprofen x 1 for left foot pain and trazodone x 1 for insomnia.  Patient observed attending therapeutic milieu and unit group activities and interacting with other patients on the unit. Thinking  Today's assessment notes: On assessment today, the pt reports that his mood is less depressed, and report depression as number is 0/10 with 10 being high severity.   Patient's mood is currently enhanced with pressured speech.  Patient is alert, cooperative, and oriented to person, place, time, and situation.  Chart reviewed and findings shared with the treatment team and consult with attending psychiatrist Dr. Abbott Pao.  Patient reports, "I would like to pursue and need help with my disability payments and affordable housing."  Information related to the LCSW to follow up with providing resources for patient's request.  Patient continues to present with delusional thinking, and paranoia.  He denies AVH, however continues to report hearing the [facility] and communicating with these higher beings.  He further denies SI, or HI.  Patient continues to require inpatient psychiatric hospital admission at this time to improve his symptoms and mood stabilization. Reports that anxiety is at manageable level Nursing staff report patient sleeping about 8 hours last night. Appetite is good Concentration is improving Energy level is adequate Denies suicidal thoughts.  Denies suicidal intent and plan.  Denies having any HI.  Denies having psychotic symptoms, however communicating with the [facility] and higher beings.  Denies having side effects to current psychiatric medications.   We discussed compliance to current medication regimen, as patient relates to this provider that his psychotropic medications is preventing him from communicating with the [facility] and other higher beings.  Active listening and emotional support provided to patient for ongoing stressors.   Total Time spent with patient: 45 minutes  Past Psychiatric History: Previous Psych Diagnoses: Schizoaffective disorder, bipolar type, high risk medication use, nicotine abuse, anxiety state, paranoid ideation, and insomnia Prior inpatient treatment: Yes, x 7.  Was recently discharged from [facility] in July 2024 Current/prior outpatient treatment: [facility] [place] Prior rehab hx:  Denies Psychotherapy hx: Yes History of suicide: History of suicide attempt several times as a child. History of homicide or aggression: Denies Psychiatric medication history: Patient has been on trial Zyprexa, Depakote, Risperdal, Abilify, and trazodone Psychiatric medication compliance history: Noncompliance Neuromodulation history: Denies Current Psychiatrist: Denies Current therapist: [facility]    Past Medical History:  Past Medical History:  Diagnosis Date   Manic behavior (HCC)    Psychotic affective disorder (HCC) 08/30/2015   History reviewed. No pertinent surgical history. Family History:  Family History  Problem Relation Age of Onset   Healthy Mother    Family Psychiatric  History: See H&P Social History:  Social History   Substance and Sexual Activity  Alcohol Use No     Social History   Substance and Sexual Activity  Drug Use Not Currently   Types: Marijuana    Social History   Socioeconomic History   Marital status: Single    Spouse name: Not on file   Number of children: Not on file   Years of education: Not on file   Highest education level: Not on file  Occupational History   Not on file  Tobacco Use   Smoking status: Never   Smokeless tobacco: Never  Vaping Use   Vaping status: Every Day   Substances: Nicotine, Flavoring  Substance and Sexual Activity   Alcohol use: No   Drug use: Not Currently    Types: Marijuana   Sexual activity: Yes  Other Topics Concern   Not on file  Social History Narrative   Not on file   Social Drivers of Health   Financial Resource Strain: Low Risk  (09/18/2020)   Overall Financial Resource Strain (CARDIA)    Difficulty of Paying Living Expenses: Not very hard  Food Insecurity: No Food Insecurity (12/04/2023)   Hunger Vital Sign    Worried About Running Out of Food in the Last Year: Never true    Ran Out of Food in the Last Year: Never true  Transportation Needs: No Transportation Needs  (12/04/2023)   PRAPARE - Administrator, Civil Service (Medical): No    Lack of Transportation (Non-Medical): No  Physical Activity: Sufficiently Active (09/18/2020)   Exercise Vital Sign    Days of Exercise per Week: 4 days    Minutes of Exercise per Session: 60 min  Stress: No Stress Concern Present (09/18/2020)   [employer] of Occupational Health - Occupational Stress Questionnaire    Feeling of Stress : Not at all  Social Connections: Unknown (01/04/2022)   [facility] from [facility]   Social Network    Social Network: Not on file   Additional Social History:     Sleep: Good  Appetite:  Good  Current Medications: Current Facility-Administered Medications  Medication Dose Route Frequency Provider Last Rate Last Admin   acetaminophen (TYLENOL) tablet 650 mg  650 mg Oral Q6H PRN Coleman, Carolyn H, NP   650 mg at 12/05/23 0821   alum & mag hydroxide-simeth (MAALOX/MYLANTA) 200-200-20 MG/5ML suspension 30 mL  30 mL Oral Q4H PRN Costella Dirks, NP       ARIPiprazole (ABILIFY) tablet 10 mg  10 mg Oral Daily Hoorain Kozakiewicz C, FNP   10 mg at 12/06/23 0823   divalproex (DEPAKOTE) DR tablet 500 mg  500 mg Oral Q12H Jentri Aye C, FNP   500 mg at 12/06/23 0823   hydrOXYzine (ATARAX) tablet 25 mg  25 mg Oral TID PRN  Ardis Hughs, NP   25 mg at 12/05/23 2325   ibuprofen (ADVIL) tablet 400 mg  400 mg Oral Q6H PRN Cecilie Lowers, FNP   400 mg at 12/06/23 0825   magnesium hydroxide (MILK OF MAGNESIA) suspension 30 mL  30 mL Oral Daily PRN Ardis Hughs, NP       OLANZapine (ZYPREXA) injection 10 mg  10 mg Intramuscular TID PRN Ardis Hughs, NP       OLANZapine (ZYPREXA) injection 5 mg  5 mg Intramuscular TID PRN Ardis Hughs, NP       OLANZapine zydis (ZYPREXA) disintegrating tablet 5 mg  5 mg Oral TID PRN Ardis Hughs, NP       propranolol (INDERAL) tablet 10 mg  10 mg Oral BID Cecilie Lowers, FNP   10 mg at 12/06/23 4098   traZODone  (DESYREL) tablet 50 mg  50 mg Oral QHS PRN Cecilie Lowers, FNP   50 mg at 12/05/23 2325    Lab Results:  Results for orders placed or performed during the hospital encounter of 12/04/23 (from the past 48 hours)  VITAMIN D 25 Hydroxy (Vit-D Deficiency, Fractures)     Status: None   Collection Time: 12/05/23  7:07 PM  Result Value Ref Range   Vit D, 25-Hydroxy 42.52 30 - 100 ng/mL    Comment: (NOTE) Vitamin D deficiency has been defined by the Institute of Medicine  and an Endocrine Society practice guideline as a level of serum 25-OH  vitamin D less than 20 ng/mL (1,2). The Endocrine Society went on to  further define vitamin D insufficiency as a level between 21 and 29  ng/mL (2).  1. IOM (Institute of Medicine). 2010. Dietary reference intakes for  calcium and D. [place]: [employer]. 2. Holick MF, Binkley [place], Bischoff-Ferrari HA, et al. Evaluation,  treatment, and prevention of vitamin D deficiency: an Endocrine  Society clinical practice guideline, JCEM. 2011 Jul; 96(7): 1911-30.  Performed at [facility], [address], [place], [place] [address]   Vitamin B12     Status: None   Collection Time: 12/05/23  7:07 PM  Result Value Ref Range   Vitamin B-12 486 180 - 914 pg/mL    Comment: (NOTE) This assay is not validated for testing neonatal or myeloproliferative syndrome specimens for Vitamin B12 levels. Performed at [facility], [address], [place], [place] [address]     Blood Alcohol level:  Lab Results  Component Value Date   [facility] <10 12/03/2023   ETH <10 02/22/2023    Metabolic Disorder Labs: Lab Results  Component Value Date   HGBA1C 4.2 (L) 12/03/2023   MPG 73.84 12/03/2023   MPG 73.84 02/22/2023   Lab Results  Component Value Date   PROLACTIN 5.7 02/22/2023   PROLACTIN 11.2 04/07/2022   Lab Results  Component Value Date   CHOL 218 (H) 12/03/2023   TRIG 46 12/03/2023   HDL 75 12/03/2023   CHOLHDL  2.9 12/03/2023   VLDL 9 12/03/2023   LDLCALC 134 (H) 12/03/2023   LDLCALC 96 02/22/2023   Physical Findings: AIMS:  , ,  ,  ,    CIWA:    COWS:     Musculoskeletal: Strength & Muscle Tone: within normal limits Gait & Station: normal Patient leans: N/A  Psychiatric Specialty Exam:  Presentation  General Appearance:  Appropriate for Environment; Casual  Eye Contact: Good  Speech: Clear and Coherent  Speech Volume: Increased (Pressured)  Handedness: Right  Mood and Affect  Mood: Euphoric  Affect: Congruent   Thought Process  Thought Processes: Coherent  Descriptions of Associations:Intact  Orientation:Full (Time, Place and Person)  Thought Content:Tangential; Paranoid Ideation  History of Schizophrenia/Schizoaffective disorder:Yes  Duration of Psychotic Symptoms:Greater than six months  Hallucinations:Hallucinations: None (Denies hallucination, however reports continued to talk with the [facility] with him) Description of Auditory Hallucinations: Hearing voices of the [employer] inside him Description of Visual Hallucinations: Denies  Ideas of Reference:None  Suicidal Thoughts:Suicidal Thoughts: No  Homicidal Thoughts:Homicidal Thoughts: No  Sensorium  Memory: Immediate Fair; Recent Fair  Judgment: Impaired  Insight: Shallow  Executive Functions  Concentration: Good  Attention Span: Good  Recall: Fair  Fund of Knowledge: Fair  Language: Good  Psychomotor Activity  Psychomotor Activity: Psychomotor Activity: Normal  Assets  Assets: Physical Health; Resilience  Sleep  Sleep: Sleep: Good Number of Hours of Sleep: 8  Physical Exam: Physical Exam Vitals and nursing note reviewed.  Constitutional:      Appearance: He is normal weight.  HENT:     Head: Normocephalic.     Right Ear: External ear normal.     Left Ear: External ear normal.     Nose: Nose normal.     Mouth/Throat:     Mouth: Mucous membranes are  moist.     Pharynx: Oropharynx is clear.  Eyes:     Extraocular Movements: Extraocular movements intact.  Cardiovascular:     Rate and Rhythm: Normal rate.     Pulses: Normal pulses.  Pulmonary:     Effort: Pulmonary effort is normal.  Abdominal:     Comments: Deferred  Genitourinary:    Comments: Deferred  Musculoskeletal:        General: Normal range of motion.     Cervical back: Normal range of motion.  Skin:    General: Skin is warm.  Neurological:     General: No focal deficit present.     Mental Status: He is alert and oriented to person, place, and time.  Psychiatric:        Mood and Affect: Mood normal.        Behavior: Behavior normal.    Review of Systems  Constitutional:  Negative for chills and fever.  HENT:  Negative for sore throat.   Eyes:  Negative for blurred vision.  Respiratory:  Negative for cough, sputum production, shortness of breath and wheezing.   Cardiovascular:  Negative for chest pain and palpitations.  Gastrointestinal:  Negative for abdominal pain, constipation, diarrhea, heartburn, nausea and vomiting.  Genitourinary:  Negative for dysuria, frequency and urgency.  Musculoskeletal:  Negative for myalgias.       Report of left foot pain due to advancing in church  Skin:  Negative for itching and rash.  Neurological:  Negative for dizziness and headaches.  Endo/Heme/Allergies:        See allergy listing  Psychiatric/Behavioral:  Positive for hallucinations. Negative for depression, substance abuse and suicidal ideas. The patient is nervous/anxious. The patient does not have insomnia.    Blood pressure 139/86, pulse 76, temperature 98.2 F (36.8 C), temperature source Oral, resp. rate 16, height 5\' 9"  (1.753 m), weight 68.5 kg, SpO2 99%. Body mass index is 22.3 kg/m.   Treatment Plan Summary: Daily contact with patient to assess and evaluate symptoms and progress in treatment and Medication management Physician Treatment Plan for Primary  Diagnosis:  Assessment: Nathan Holloway is a 28 year old African-American male  with prior psychiatric diagnoses significant for schizoaffective disorder bipolar type high risk medication use nicotine abuse anxiety states insomnia paranoid ideation who presents voluntarily to [facility] from [facility] behavioral health urgent care for worsening mood disorder in the context of cycling manic behavior.   Schizoaffective disorder, bipolar type (HCC)   Plans: Medications: -- Continue Abilify 10 mg starting 12/06/2023.  Plan is to discharge patient on Abilify LAI. --Continue Depakote 500 mg p.o. twice daily.  Obtained Depakote level on 12/09/2023 --Continue hydroxyzine tablet 25 mg p.o. 3 times daily as needed for anxiety --Continue trazodone 50 mg p.o. q. nightly as needed for insomnia --Continue propranolol 10 mg p.o. twice daily for hypertension   Other PRN Medications  -Acetaminophen 650 mg every 6 as needed/mild pain  -Maalox 30 mL oral every 4 as needed/digestion  -Magnesium hydroxide 30 mL daily as needed/mild constipation    Continue BH Agitation Protocol  --Haldol 5 mg, oral, 3 times daily as needed, mild agitation  --Benadryl 50 mg, oral, 3 times daily as needed, mild agitation                                      OR   --Haldol injection 5 mg, IM, 3 times daily as needed, moderate agitation  --Benadryl injection 50 mg, IM, 3 times daily as needed, moderate agitation  --Ativan injection 2 mg, IM, 3 times daily as needed, moderate agitation                                      OR  --Haldol injection 10 mg, IM, 3 times daily as needed, severe agitation  --Benadryl injection 50 mg, IM, 3 times daily as needed, severe agitation  --Ativan injection 2 mg, IM, 3 times daily as needed, severe agitation    --  The risks/benefits/side-effects/alternatives to this medication were discussed in detail with the patient and time was given for questions. The patient  consents to medication trial.   -- Metabolic profile and EKG monitoring obtained while on an atypical antipsychotic (BMI: Lipid Panel: HbgA1c: QTc:)   -- Encouraged patient to participate in unit milieu and in scheduled group therapies     Admission labs reviewed: CMP: Creatinine 1.51 elevated, calcium 10.5 elevated, albumin 5.1 elevated, total bilirubin 8.9 elevated.  Otherwise normal.  Lipid profile: Cholesterol 218 elevated, LDL 134 elevated, otherwise normal.  CBC with differential was within normal limits.  BAL less than 10.  UDS: Positive for benzos.  Hemoglobin A1c: 4.2.   New labs ordered: Depakote level, vitamin B 12: 486, within normal limits, vitamin D 25-hydroxy: 42.523, within normal limits.   EKG reviewed: Normal sinus rhythm, ventricular rate 74, QT/QTc 348/380   Safety and Monitoring:  Voluntary admission to inpatient psychiatric unit for safety, stabilization and treatment  Daily contact with patient to assess and evaluate symptoms and progress in treatment  Patient's case to be discussed in multi-disciplinary team meeting  Observation Level : q15 minute checks  Vital signs: q12 hours  Precautions: suicide, but pt currently verbally contracts for safety on unit?    Discharge Planning:  Social work and case management to assist with discharge planning and identification of hospital follow-up needs prior to discharge  Estimated LOS: 5-7?days  Discharge Concerns: Need to establish a safety plan; Medication compliance and effectiveness  Discharge Goals: Return home with outpatient referrals for mental health follow-up including medication management/psychotherapy.    Long Term Goal(s): Improvement in symptoms so as ready for discharge   Short Term Goals: Ability to identify changes in lifestyle to reduce recurrence of condition will improve, Ability to verbalize feelings will improve, Ability to disclose and discuss suicidal ideas, Ability to demonstrate self-control will  improve, Ability to identify and develop effective coping behaviors will improve, Ability to maintain clinical measurements within normal limits will improve, Compliance with prescribed medications will improve, and Ability to identify triggers associated with substance abuse/mental health issues will improve   Physician Treatment Plan for Secondary Diagnosis: Principal Problem:   Schizoaffective disorder, bipolar type (HCC)   I certify that inpatient services furnished can reasonably be expected to improve the patient's condition.      Laurence Pons, FNP 12/06/2023, 2:44 PM

## 2023-12-06 NOTE — [facility] Suicide Risk Assessment (Signed)
 [facility] INPATIENT:  Family/Significant Other Suicide Prevention Education  Suicide Prevention Education:  Education Completed; Mom, Nathan Holloway [phone],  (name of family member/significant other) has been identified by the patient as the family member/significant other with whom the patient will be residing, and identified as the person(s) who will aid the patient in the event of a mental health crisis (suicidal ideations/suicide attempt).  With written consent from the patient, the family member/significant other has been provided the following suicide prevention education, prior to the and/or following the discharge of the patient.  Mother would like pt to get injectable for medication as pt does not stay on oral medications regularly. Reports that Zyprexa works well for him, without any side effects.  No safety concerns about patient returning home. Will have a ride at discharge.   The suicide prevention education provided includes the following: Suicide risk factors Suicide prevention and interventions National Suicide Hotline telephone number [facility] assessment telephone number [facility] Emergency Assistance 911 [facility] and/or Residential Mobile Crisis Unit telephone number  Request made of family/significant other to: Remove weapons (e.g., guns, rifles, knives), all items previously/currently identified as safety concern.   Remove drugs/medications (over-the-counter, prescriptions, illicit drugs), all items previously/currently identified as a safety concern.  The family member/significant other verbalizes understanding of the suicide prevention education information provided.  The family member/significant other agrees to remove the items of safety concern listed above.  Nathan Holloway 12/06/2023, 3:27 PM

## 2023-12-06 NOTE — BH Assessment (Signed)
 Patient alert and cooperative this evening. Pleasant to speak with, denies any anxiety or depression, denies SI/HI and AVH. Patient verbally contracted for safety, attended group and had snack with other patients in this evenings group. Cooperative with medications, 15 min. Checks maintained

## 2023-12-06 NOTE — Progress Notes (Signed)
   12/06/23 0545  15 Minute Checks  Location Bedroom  Visual Appearance Calm  Behavior Sleeping  Sleep (Behavioral Health Patients Only)  Calculate sleep? (Click Yes once per 24 hr at 0600 safety check) Yes  Documented sleep last 24 hours 2.75

## 2023-12-06 NOTE — BH IP Treatment Plan (Signed)
 Interdisciplinary Treatment and Diagnostic Plan Update  12/06/2023 Time of Session: 1005 Nathan Holloway MRN: [id number]  Principal Diagnosis: Schizoaffective disorder, bipolar type (HCC)  Secondary Diagnoses: Principal Problem:   Schizoaffective disorder, bipolar type (HCC)   Current Medications:  Current Facility-Administered Medications  Medication Dose Route Frequency Provider Last Rate Last Admin   acetaminophen (TYLENOL) tablet 650 mg  650 mg Oral Q6H PRN Coleman, Carolyn H, NP   650 mg at 12/05/23 0821   alum & mag hydroxide-simeth (MAALOX/MYLANTA) 200-200-20 MG/5ML suspension 30 mL  30 mL Oral Q4H PRN Coleman, Carolyn H, NP       ARIPiprazole (ABILIFY) tablet 10 mg  10 mg Oral Daily Ntuen, Tina C, FNP   10 mg at 12/06/23 0823   divalproex (DEPAKOTE) DR tablet 500 mg  500 mg Oral Q12H Ntuen, Tina C, FNP   500 mg at 12/06/23 9562   hydrOXYzine (ATARAX) tablet 25 mg  25 mg Oral TID PRN Costella Dirks, NP   25 mg at 12/05/23 2325   ibuprofen (ADVIL) tablet 400 mg  400 mg Oral Q6H PRN Ntuen, Tina C, FNP   400 mg at 12/06/23 0825   magnesium hydroxide (MILK OF MAGNESIA) suspension 30 mL  30 mL Oral Daily PRN Costella Dirks, NP       OLANZapine (ZYPREXA) injection 10 mg  10 mg Intramuscular TID PRN Costella Dirks, NP       OLANZapine (ZYPREXA) injection 5 mg  5 mg Intramuscular TID PRN Costella Dirks, NP       OLANZapine zydis (ZYPREXA) disintegrating tablet 5 mg  5 mg Oral TID PRN Costella Dirks, NP       propranolol (INDERAL) tablet 10 mg  10 mg Oral BID Ntuen, Tina C, FNP   10 mg at 12/06/23 0823   traZODone (DESYREL) tablet 50 mg  50 mg Oral QHS PRN Ntuen, Tina C, FNP   50 mg at 12/05/23 2325   PTA Medications: Medications Prior to Admission  Medication Sig Dispense Refill Last Dose/Taking   hydrOXYzine (ATARAX) 25 MG tablet Take 1 tablet (25 mg total) by mouth 3 (three) times daily as needed for anxiety. (Patient not taking: Reported on 12/03/2023) 30 tablet 0     OLANZapine (ZYPREXA) 5 MG tablet Take 1 tablet (5 mg total) by mouth 2 (two) times daily.       Patient Stressors: Medication change or noncompliance    Patient Strengths: Active sense of humor  Communication skills  General fund of knowledge  Motivation for treatment/growth  Physical Health  Religious Affiliation  Supportive family/friends   Treatment Modalities: Medication Management, Group therapy, Case management,  1 to 1 session with clinician, Psychoeducation, Recreational therapy.   Physician Treatment Plan for Primary Diagnosis: Schizoaffective disorder, bipolar type (HCC) Long Term Goal(s): Improvement in symptoms so as ready for discharge   Short Term Goals: Ability to identify changes in lifestyle to reduce recurrence of condition will improve Ability to verbalize feelings will improve Ability to disclose and discuss suicidal ideas Ability to demonstrate self-control will improve Ability to identify and develop effective coping behaviors will improve Ability to maintain clinical measurements within normal limits will improve Compliance with prescribed medications will improve Ability to identify triggers associated with substance abuse/mental health issues will improve  Medication Management: Evaluate patient's response, side effects, and tolerance of medication regimen.  Therapeutic Interventions: 1 to 1 sessions, Unit Group sessions and Medication administration.  Evaluation of Outcomes: Not Progressing  Physician Treatment Plan  for Secondary Diagnosis: Principal Problem:   Schizoaffective disorder, bipolar type (HCC)  Long Term Goal(s): Improvement in symptoms so as ready for discharge   Short Term Goals: Ability to identify changes in lifestyle to reduce recurrence of condition will improve Ability to verbalize feelings will improve Ability to disclose and discuss suicidal ideas Ability to demonstrate self-control will improve Ability to identify and develop  effective coping behaviors will improve Ability to maintain clinical measurements within normal limits will improve Compliance with prescribed medications will improve Ability to identify triggers associated with substance abuse/mental health issues will improve     Medication Management: Evaluate patient's response, side effects, and tolerance of medication regimen.  Therapeutic Interventions: 1 to 1 sessions, Unit Group sessions and Medication administration.  Evaluation of Outcomes: Not Progressing   RN Treatment Plan for Primary Diagnosis: Schizoaffective disorder, bipolar type (HCC) Long Term Goal(s): Knowledge of disease and therapeutic regimen to maintain health will improve  Short Term Goals: Ability to remain free from injury will improve, Ability to verbalize frustration and anger appropriately will improve, Ability to demonstrate self-control, Ability to participate in decision making will improve, Ability to verbalize feelings will improve, Ability to disclose and discuss suicidal ideas, Ability to identify and develop effective coping behaviors will improve, and Compliance with prescribed medications will improve  Medication Management: RN will administer medications as ordered by provider, will assess and evaluate patient's response and provide education to patient for prescribed medication. RN will report any adverse and/or side effects to prescribing provider.  Therapeutic Interventions: 1 on 1 counseling sessions, Psychoeducation, Medication administration, Evaluate responses to treatment, Monitor vital signs and CBGs as ordered, Perform/monitor CIWA, COWS, AIMS and Fall Risk screenings as ordered, Perform wound care treatments as ordered.  Evaluation of Outcomes: Not Progressing   LCSW Treatment Plan for Primary Diagnosis: Schizoaffective disorder, bipolar type (HCC) Long Term Goal(s): Safe transition to appropriate next level of care at discharge, Engage patient in  therapeutic group addressing interpersonal concerns.  Short Term Goals: Engage patient in aftercare planning with referrals and resources, Increase social support, Increase ability to appropriately verbalize feelings, Increase emotional regulation, Facilitate acceptance of mental health diagnosis and concerns, Facilitate patient progression through stages of change regarding substance use diagnoses and concerns, Identify triggers associated with mental health/substance abuse issues, and Increase skills for wellness and recovery  Therapeutic Interventions: Assess for all discharge needs, 1 to 1 time with Social worker, Explore available resources and support systems, Assess for adequacy in community support network, Educate family and significant other(s) on suicide prevention, Complete Psychosocial Assessment, Interpersonal group therapy.  Evaluation of Outcomes: Not Progressing   Progress in Treatment: Attending groups: Yes. Participating in groups: Yes. Taking medication as prescribed: Yes. Toleration medication: Yes. Family/Significant other contact made: No, will contact:  Seanpatrick, [place] [phone] Patient understands diagnosis: Yes. Discussing patient identified problems/goals with staff: Yes. Medical problems stabilized or resolved: Yes. Denies suicidal/homicidal ideation: Yes. Issues/concerns per patient self-inventory: No.  New problem(s) identified: No, Describe:  none  New Short Term/Long Term Goal(s): medication stabilization, elimination of SI thoughts, development of comprehensive mental wellness plan.    Patient Goals:  "Get re-evaluated on my diagnosis. I am not bipolar"  Discharge Plan or Barriers: Patient recently admitted. CSW will continue to follow and assess for appropriate referrals and possible discharge planning.    Reason for Continuation of Hospitalization: Delusions  Mania Medication stabilization  Estimated Length of Stay: 5-7 days  Last 3 [place]  Suicide Severity Risk Score: Flowsheet  Row [facility]Current[facility] from [facility]20[facility] in [facility]Dis[facility]  C-SSRS RISK CATEGORY High Risk High Risk No Risk       Last PHQ 2/9 Scores:    05/17/2022    1:00 PM 05/03/2022    3:41 PM 04/07/2022    7:28 PM  Depression screen PHQ 2/9  Decreased Interest 1 1 0  Down, Depressed, Hopeless 0 0 2  PHQ - 2 Score 1 1 2   Altered sleeping 3  2  Tired, decreased energy 3  0  Change in appetite 3  2  Feeling bad or failure about yourself  1  0  Trouble concentrating 1  1  Moving slowly or fidgety/restless 0  2  Suicidal thoughts 0  0  PHQ-9 Score 12  9  Difficult doing work/chores Somewhat difficult  Very difficult    Scribe for Treatment Team: Rheta Celestine 12/06/2023 2:47 PM

## 2023-12-06 NOTE — Group Note (Signed)
 Recreation Therapy Group Note   Group Topic:Other  Group Date: 12/06/2023 Start Time: 1400 End Time: 1440 Facilitators: Park Beck-McCall, LRT,CTRS Location: 300 [address] Dayroom   Activity Description/Intervention: Therapeutic Drumming. Patients with peers and staff were given the opportunity to engage in a leader facilitated [facility] Empowerment Drumming Circle with staff from the [employer], in partnership with [employer]. Teaching laboratory technician and trained [employer], Kathlyne Parchment leading with LRT observing and documenting intervention and pt response. This evidenced-based practice targets 7 areas of health and wellbeing in the human experience including: stress-reduction, exercise, self-expression, camaraderie/support, nurturing, spirituality, and music-making (leisure).   Goal Area(s) Addresses:  Patient will engage in pro-social way in music group.  Patient will follow directions of drum leader on the first prompt. Patient will demonstrate no behavioral issues during group.  Patient will identify if a reduction in stress level occurs as a result of participation in therapeutic drum circle.    Education: Leisure exposure, Pharmacologist, Musical expression, Discharge Planning   Affect/Mood: N/A   Participation Level: Did not attend    Clinical Observations/Individualized Feedback:     Plan: Continue to engage patient in RT group sessions 2-3x/week.   Island Dohmen-McCall, LRT,CTRS  12/06/2023 4:04 PM

## 2023-12-06 NOTE — Plan of Care (Signed)
   Problem: Education: Goal: Knowledge of [facility] Education information/materials will improve Outcome: Progressing Goal: Emotional status will improve Outcome: Progressing Goal: Mental status will improve Outcome: Progressing Goal: Verbalization of understanding the information provided will improve Outcome: Progressing

## 2023-12-06 NOTE — Plan of Care (Signed)
   Problem: Activity: Goal: Interest or engagement in activities will improve Outcome: Progressing   Problem: Coping: Goal: Ability to verbalize frustrations and anger appropriately will improve Outcome: Progressing   Problem: Safety: Goal: Periods of time without injury will increase Outcome: Progressing

## 2023-12-06 NOTE — Group Note (Signed)
 Date:  12/06/2023 Time:  9:39 AM  Group Topic/Focus:  Goals Group:   The focus of this group is to help patients establish daily goals to achieve during treatment and discuss how the patient can incorporate goal setting into their daily lives to aide in recovery.    Participation Level:  Active  Participation Quality:  Appropriate  Affect:  Appropriate   Nathan Holloway 12/06/2023, 9:39 AM

## 2023-12-06 NOTE — Progress Notes (Signed)
   12/06/23 0800  Psych Admission Type (Psych Patients Only)  Admission Status Voluntary  Psychosocial Assessment  Patient Complaints Hyperactivity;Anxiety  Eye Contact Fair  Facial Expression Anxious;Animated  Affect Anxious;Euphoric  Speech Rapid;Pressured  Interaction Assertive  Motor Activity Hyperactive  Appearance/Hygiene Unremarkable  Behavior Characteristics Cooperative  Mood Pleasant  Thought Process  Coherency Disorganized  Content Religiosity  Delusions Religious  Hallucination None reported or observed  Judgment Impaired  Confusion None  Danger to Self  Current suicidal ideation? Denies  Agreement Not to Harm Self Yes  Description of Agreement verbal  Danger to Others  Danger to Others None reported or observed

## 2023-12-07 DIAGNOSIS — F250 Schizoaffective disorder, bipolar type: Secondary | ICD-10-CM | POA: Diagnosis not present

## 2023-12-07 NOTE — Group Note (Signed)
 Date:  12/07/2023 Time:  3:29 PM  Group Topic/Focus:  Emotional Education:   The focus of this group is to discuss what feelings/emotions are, and how they are experienced.    Participation Level:  Active  Participation Quality:  Attentive  Affect:  Appropriate  Cognitive:  Alert  Insight: Good  Engagement in Group:  Engaged  Modes of Intervention:  Education  Additional Comments:  Nathan Holloway is ready to go home and live a healthy life.  Hughie Mae 12/07/2023, 3:29 PM

## 2023-12-07 NOTE — Plan of Care (Signed)

## 2023-12-07 NOTE — Plan of Care (Signed)
   Problem: Education: Goal: Knowledge of [facility] Education information/materials will improve Outcome: Progressing Goal: Emotional status will improve Outcome: Progressing Goal: Mental status will improve Outcome: Progressing Goal: Verbalization of understanding the information provided will improve Outcome: Progressing

## 2023-12-07 NOTE — Group Note (Signed)
 Date:  12/07/2023 Time:  10:00 AM  Group Topic/Focus:  Goals Group:   The focus of this group is to help patients establish daily goals to achieve during treatment and discuss how the patient can incorporate goal setting into their daily lives to aide in recovery.    Participation Level:  Active  Participation Quality:  Attentive  Affect:  Appropriate  Cognitive:  Alert  Insight: Good  Engagement in Group:  Engaged  Modes of Intervention:  Education  Additional Comments:  Nathan Holloway is attentive and engaged in the group.   Nathan Holloway 12/07/2023, 10:00 AM

## 2023-12-07 NOTE — Progress Notes (Signed)
 D: Patient is alert, pleasant, and cooperative. Endorses AVH of "the holy spirit". Denies SI, HI, and verbally contracts for safety. Patient reports he slept good last night without sleeping medication. Patient reports his appetite as good, energy level as normal, and concentration as good. Patient rates his depression 0/10, hopelessness 0/10, and anxiety 0/10. Patient denies physical symptoms/pain.    A: Scheduled medications administered per MD order. Support provided. Patient educated on safety on the unit and medications. Routine safety checks every 15 minutes. Patient stated understanding to tell nurse about any new physical symptoms. Patient understands to tell staff of any needs.     R: No adverse drug reactions noted. Patient remains safe at this time and will continue to monitor.    12/07/23 0900  Psych Admission Type (Psych Patients Only)  Admission Status Voluntary  Psychosocial Assessment  Patient Complaints None  Eye Contact Fair  Facial Expression Animated  Affect Appropriate to circumstance  Speech Logical/coherent  Interaction Assertive  Motor Activity Other (Comment) (WNL)  Appearance/Hygiene Bizarre  Behavior Characteristics Cooperative  Mood Pleasant  Thought Process  Coherency WDL  Content Religiosity  Delusions Religious  Perception Derealization;Hallucinations  Hallucination Auditory;Visual ("the holy spirit")  Judgment Limited  Confusion None  Danger to Self  Current suicidal ideation? Denies  Self-Injurious Behavior No self-injurious ideation or behavior indicators observed or expressed   Agreement Not to Harm Self Yes  Description of Agreement verbal  Danger to Others  Danger to Others None reported or observed

## 2023-12-07 NOTE — Progress Notes (Signed)
 [facility] MD Progress Note  12/07/2023 5:15 PM Nathan Holloway  MRN:  [id number]  Reason for admission: 28 year old African-American male with prior psychiatric diagnoses significant for schizoaffective disorder, bipolar-type, high risk medication use, nicotine abuse, anxiety, insomnia & paranoid ideation. Patient presents voluntarily to the [facility] for worsening symptoms of schizoaffective disorder, bipolar-type (cycling mood)..   Daily notes: Krystle is seen. Chart reviewed. The chart findings discussed with the treatment team. He presents alert, oriented & aware of situation. He presents with a good affect, good eye contact. He is visible on the unit, attending group sessions. He presents with pressured speech & a bit hypomanic. He reports, "I'm doing okay. It is just that my mom noticed that I was having an episode. She called & insisted that I need to go to the hospital to be checked-out. That was just what I did, then I ended-up in this hospital. I'm doing very well. I feel great today. I'm not depressed or having any anxiety symptoms. I'm taking my medicines & I'm doing well on them. Is it possible that I can be discharged today? I'm sleeping well. There are no side effects to report at this time". Marti currently denies any SIHI, AVH, delusional thoughts or paranoia. He does not appear to be responding to any internal stimuli. Although stating how well he is doing, patient is informed that it is unlikely that he will be discharged today or tomorrow because his treatment team are still titrating  his medications to get his mood stable again. Patient at this time is in no apparent distress. His Depakote ER level to be drawn on 12-09-23. There are no changes made on his current plan of care. Continue as already in progress. Vital signs remain, stable.  Principal Problem: Schizoaffective disorder, bipolar type (HCC)  Diagnosis: Principal Problem:   Schizoaffective disorder, bipolar type (HCC)  Total Time spent  with patient: 45 minutes  Past Psychiatric History: See H&P.  Past Medical History:  Past Medical History:  Diagnosis Date   Manic behavior (HCC)    Psychotic affective disorder (HCC) 08/30/2015   History reviewed. No pertinent surgical history.  Family History:  Family History  Problem Relation Age of Onset   Healthy Mother    Family Psychiatric  History: See H&P.  Social History:  Social History   Substance and Sexual Activity  Alcohol Use No     Social History   Substance and Sexual Activity  Drug Use Not Currently   Types: Marijuana    Social History   Socioeconomic History   Marital status: Single    Spouse name: Not on file   Number of children: Not on file   Years of education: Not on file   Highest education level: Not on file  Occupational History   Not on file  Tobacco Use   Smoking status: Never   Smokeless tobacco: Never  Vaping Use   Vaping status: Every Day   Substances: Nicotine, Flavoring  Substance and Sexual Activity   Alcohol use: No   Drug use: Not Currently    Types: Marijuana   Sexual activity: Yes  Other Topics Concern   Not on file  Social History Narrative   Not on file   Social Drivers of Health   Financial Resource Strain: Low Risk  (09/18/2020)   Overall Financial Resource Strain (CARDIA)    Difficulty of Paying Living Expenses: Not very hard  Food Insecurity: No Food Insecurity (12/04/2023)   Hunger Vital Sign    Worried  About Running Out of Food in the Last Year: Never true    Ran Out of Food in the Last Year: Never true  Transportation Needs: No Transportation Needs (12/04/2023)   PRAPARE - Administrator, Civil Service (Medical): No    Lack of Transportation (Non-Medical): No  Physical Activity: Sufficiently Active (09/18/2020)   Exercise Vital Sign    Days of Exercise per Week: 4 days    Minutes of Exercise per Session: 60 min  Stress: No Stress Concern Present (09/18/2020)   [employer] of  Occupational Health - Occupational Stress Questionnaire    Feeling of Stress : Not at all  Social Connections: Unknown (01/04/2022)   [facility] from [facility]   Social Network    Social Network: Not on file   Additional Social History:   Sleep: Good  Appetite:  Good  Current Medications: Current Facility-Administered Medications  Medication Dose Route Frequency Provider Last Rate Last Admin   acetaminophen (TYLENOL) tablet 650 mg  650 mg Oral Q6H PRN Ardis Hughs, NP   650 mg at 12/06/23 1722   alum & mag hydroxide-simeth (MAALOX/MYLANTA) 200-200-20 MG/5ML suspension 30 mL  30 mL Oral Q4H PRN Ardis Hughs, NP       ARIPiprazole (ABILIFY) tablet 10 mg  10 mg Oral Daily Ntuen, Jesusita Oka, FNP   10 mg at 12/07/23 0750   divalproex (DEPAKOTE) DR tablet 500 mg  500 mg Oral Q12H Cecilie Lowers, FNP   500 mg at 12/07/23 0750   hydrOXYzine (ATARAX) tablet 25 mg  25 mg Oral TID PRN Ardis Hughs, NP   25 mg at 12/07/23 0348   ibuprofen (ADVIL) tablet 400 mg  400 mg Oral Q6H PRN Cecilie Lowers, FNP   400 mg at 12/06/23 0825   magnesium hydroxide (MILK OF MAGNESIA) suspension 30 mL  30 mL Oral Daily PRN Ardis Hughs, NP       OLANZapine (ZYPREXA) injection 10 mg  10 mg Intramuscular TID PRN Ardis Hughs, NP       OLANZapine (ZYPREXA) injection 5 mg  5 mg Intramuscular TID PRN Ardis Hughs, NP       OLANZapine zydis (ZYPREXA) disintegrating tablet 5 mg  5 mg Oral TID PRN Ardis Hughs, NP       propranolol (INDERAL) tablet 10 mg  10 mg Oral BID Ntuen, Tina C, FNP   10 mg at 12/07/23 1714   traZODone (DESYREL) tablet 50 mg  50 mg Oral QHS PRN Cecilie Lowers, FNP   50 mg at 12/06/23 2138    Lab Results:  Results for orders placed or performed during the hospital encounter of 12/04/23 (from the past 48 hours)  VITAMIN D 25 Hydroxy (Vit-D Deficiency, Fractures)     Status: None   Collection Time: 12/05/23  7:07 PM  Result Value Ref Range   Vit D,  25-Hydroxy 42.52 30 - 100 ng/mL    Comment: (NOTE) Vitamin D deficiency has been defined by the Institute of Medicine  and an Endocrine Society practice guideline as a level of serum 25-OH  vitamin D less than 20 ng/mL (1,2). The Endocrine Society went on to  further define vitamin D insufficiency as a level between 21 and 29  ng/mL (2).  1. IOM (Institute of Medicine). 2010. Dietary reference intakes for  calcium and D. [place]: [employer]. 2. Holick MF, [place], Bischoff-Ferrari HA, et al. Evaluation,  treatment,  and prevention of vitamin D deficiency: an Endocrine  Society clinical practice guideline, JCEM. 2011 Jul; 96(7): 1911-30.  Performed at [facility], [address], [place], [place] [address]   Vitamin B12     Status: None   Collection Time: 12/05/23  7:07 PM  Result Value Ref Range   Vitamin B-12 486 180 - 914 pg/mL    Comment: (NOTE) This assay is not validated for testing neonatal or myeloproliferative syndrome specimens for Vitamin B12 levels. Performed at [facility], [address], [place], [place] [address]     Blood Alcohol level:  Lab Results  Component Value Date   [facility] <10 12/03/2023   ETH <10 02/22/2023    Metabolic Disorder Labs: Lab Results  Component Value Date   HGBA1C 4.2 (L) 12/03/2023   MPG 73.84 12/03/2023   MPG 73.84 02/22/2023   Lab Results  Component Value Date   PROLACTIN 5.7 02/22/2023   PROLACTIN 11.2 04/07/2022   Lab Results  Component Value Date   CHOL 218 (H) 12/03/2023   TRIG 46 12/03/2023   HDL 75 12/03/2023   CHOLHDL 2.9 12/03/2023   VLDL 9 12/03/2023   LDLCALC 134 (H) 12/03/2023   LDLCALC 96 02/22/2023    Physical Findings: AIMS:  , ,  ,  ,    CIWA:    COWS:     Musculoskeletal: Strength & Muscle Tone: within normal limits Gait & Station: normal Patient leans: N/A  Psychiatric Specialty Exam:  Presentation  General Appearance:  Appropriate for  Environment; Casual  Eye Contact: Good  Speech: Clear and Coherent  Speech Volume: Increased (Pressured)  Handedness: Right   Mood and Affect  Mood: Euphoric  Affect: Congruent   Thought Process  Thought Processes: Coherent  Descriptions of Associations:Intact  Orientation:Full (Time, Place and Person)  Thought Content:Tangential; Paranoid Ideation  History of Schizophrenia/Schizoaffective disorder:Yes  Duration of Psychotic Symptoms:Greater than six months  Hallucinations:Hallucinations: None (Denies hallucination, however reports continued to talk with the [facility] with him) Description of Auditory Hallucinations: Hearing voices of the [employer] inside him Description of Visual Hallucinations: Denies  Ideas of Reference:None  Suicidal Thoughts:Suicidal Thoughts: No  Homicidal Thoughts:Homicidal Thoughts: No   Sensorium  Memory: Immediate Fair; Recent Fair  Judgment: Impaired  Insight: Shallow   Executive Functions  Concentration: Good  Attention Span: Good  Recall: Fair  Fund of Knowledge: Fair  Language: Good   Psychomotor Activity  Psychomotor Activity: Psychomotor Activity: Normal   Assets  Assets: Physical Health; Resilience   Sleep  Sleep: Sleep: Good Number of Hours of Sleep: 8  Physical Exam: Physical Exam Vitals and nursing note reviewed.  HENT:     Nose: Nose normal.  Cardiovascular:     Rate and Rhythm: Normal rate.     Pulses: Normal pulses.  Pulmonary:     Effort: Pulmonary effort is normal.  Genitourinary:    Comments: Deferred Musculoskeletal:        General: Normal range of motion.     Cervical back: Normal range of motion.  Skin:    General: Skin is dry.  Neurological:     General: No focal deficit present.     Mental Status: He is oriented to person, place, and time.    Review of Systems  Constitutional:  Negative for chills, diaphoresis and fever.  HENT:  Negative for congestion  and sore throat.   Respiratory:  Negative for cough, shortness of breath and wheezing.   Cardiovascular:  Negative for chest pain and palpitations.  Gastrointestinal:  Negative for abdominal pain, constipation, diarrhea, heartburn, nausea and vomiting.  Genitourinary:  Negative for dysuria.  Musculoskeletal:  Negative for joint pain and myalgias.  Neurological:  Negative for dizziness, tingling, tremors, sensory change, speech change, focal weakness, seizures, loss of consciousness, weakness and headaches.   Blood pressure 132/89, pulse 93, temperature 97.8 F (36.6 C), temperature source Oral, resp. rate 18, height 5\' 9"  (1.753 m), weight 68.5 kg, SpO2 97%. Body mass index is 22.3 kg/m.  Treatment Plan Summary: Daily contact with patient to assess and evaluate symptoms and progress in treatment and Medication management.   Principal/active diagnosis. Schizoaffective disorder, bipolar type (HCC)   Plans: -Continue Abilify 10 mg po daily 12/06/2023 for mood control. Plan is to discharge patient on Abilify LAI. -Continue Depakote 500 mg p.o. twice daily.  Obtained Depakote level on 12/09/2023 -Continue hydroxyzine tablet 25 mg p.o. 3 times daily as needed for anxiety -Continue trazodone 50 mg po q hs for insomnia -Continue propranolol 10 mg p.o. twice daily for hypertension   Other PRN Medications  -Acetaminophen 650 mg every 6 as needed/mild pain  -Maalox 30 mL oral every 4 as needed/digestion  -Magnesium hydroxide 30 mL daily as needed/mild constipation    Continue BH Agitation Protocol  --Haldol 5 mg, oral, 3 times daily as needed, mild agitation  --Benadryl 50 mg, oral, 3 times daily as needed, mild agitation                                      OR   --Haldol injection 5 mg, IM, 3 times daily as needed, moderate agitation  --Benadryl injection 50 mg, IM, 3 times daily as needed, moderate agitation  --Ativan injection 2 mg, IM, 3 times daily as needed, moderate agitation                                       OR  --Haldol injection 10 mg, IM, 3 times daily as needed, severe agitation  --Benadryl injection 50 mg, IM, 3 times daily as needed, severe agitation  --Ativan injection 2 mg, IM, 3 times daily as needed, severe agitation    --  The risks/benefits/side-effects/alternatives to this medication were discussed in detail with the patient and time was given for questions. The patient consents to medication trial.   -- Metabolic profile and EKG monitoring obtained while on an atypical antipsychotic (BMI: Lipid Panel: HbgA1c: QTc:)   -- Encouraged patient to participate in unit milieu and in scheduled group therapies      Asuncion Layer, NP, pmhnp, fnp-bc. 12/07/2023, 5:15 PM

## 2023-12-07 NOTE — Group Note (Signed)
 LCSW Group Therapy Note   Group Date: 12/07/2023 Start Time: 1100 End Time: 1200   Participation:  did not attend  Type of Therapy Topic:  Group Therapy  Title: Lifestyle:  from "One Day" to "Today is Day One"  Objective:  To promote mental and physical well-being through lifestyle changes in routine, nutrition, sleep, and movement.  Goals: Increase awareness of how lifestyle habits impact mental health. Encourage one small, achievable wellness goal. Support group sharing and accountability.  Summary:  Group members explored how daily habits influence mental health and discussed the importance of starting with small, manageable changes. Participants identified personal goals and shared reflections on improving structure, sleep, diet, and physical activity.  Therapeutic Modalities: CBT - Identifying and challenging all-or-nothing thinking; promoting realistic, helpful thoughts about change. Psychoeducation - Teaching about the impact of sleep, nutrition, movement, and routine on mental health. Motivational Interviewing - Eliciting personal motivation and exploring readiness for change. Goal-Setting - Supporting SMART goals to build self-efficacy and encourage follow-through.   Saafir Abdullah O Charna Neeb, LCSWA 12/07/2023  12:34 PM

## 2023-12-08 DIAGNOSIS — F250 Schizoaffective disorder, bipolar type: Secondary | ICD-10-CM | POA: Diagnosis not present

## 2023-12-08 MED ORDER — WHITE PETROLATUM EX OINT
TOPICAL_OINTMENT | CUTANEOUS | Status: AC
  Filled 2023-12-08: qty 5

## 2023-12-08 MED ORDER — TRAZODONE HCL 50 MG PO TABS
50.0000 mg | ORAL_TABLET | Freq: Every evening | ORAL | Status: DC | PRN
  Filled 2023-12-08: qty 1

## 2023-12-08 NOTE — Group Note (Signed)
 Occupational Therapy Group Note  Group Topic:Coping Skills  Group Date: 12/07/2023 Start Time: 1400 End Time: 1430 Facilitators: Lynnda Sas, OT   Group Description: Group encouraged increased engagement and participation through discussion and activity focused on "Coping Ahead." Patients were split up into teams and selected a card from a stack of positive coping strategies. Patients were instructed to act out/charade the coping skill for other peers to guess and receive points for their team. Discussion followed with a focus on identifying additional positive coping strategies and patients shared how they were going to cope ahead over the weekend while continuing hospitalization stay.  Therapeutic Goal(s): Identify positive vs negative coping strategies. Identify coping skills to be used during hospitalization vs coping skills outside of hospital/at home Increase participation in therapeutic group environment and promote engagement in treatment   Participation Level: Engaged   Participation Quality: Independent   Behavior: Appropriate   Speech/Thought Process: Relevant   Affect/Mood: Appropriate   Insight: Fair   Judgement: Fair      Modes of Intervention: Education  Patient Response to Interventions:  Attentive   Plan: Continue to engage patient in OT groups 2 - 3x/week.  12/07/2023  Lynnda Sas, OT  Trelon Plush, OT

## 2023-12-08 NOTE — Progress Notes (Signed)
   12/08/23 2045  Psych Admission Type (Psych Patients Only)  Admission Status Voluntary  Psychosocial Assessment  Patient Complaints None  Eye Contact Fair  Facial Expression Animated  Affect Appropriate to circumstance  Speech Logical/coherent  Interaction Assertive  Motor Activity Slow  Appearance/Hygiene Unremarkable  Behavior Characteristics Cooperative  Mood Anxious;Pleasant  Thought Process  Coherency WDL  Content Religiosity  Delusions Religious  Perception Hallucinations  Hallucination None reported or observed  Judgment Poor  Confusion Mild  Danger to Self  Current suicidal ideation? Denies  Self-Injurious Behavior No self-injurious ideation or behavior indicators observed or expressed   Agreement Not to Harm Self Yes  Description of Agreement Verbal Contract  Danger to Others  Danger to Others None reported or observed

## 2023-12-08 NOTE — Group Note (Signed)
 Date:  12/08/2023 Time:  9:54 AM  Group Topic/Focus:  Goals Group:   The focus of this group is to help patients establish daily goals to achieve during treatment and discuss how the patient can incorporate goal setting into their daily lives to aide in recovery. Orientation:   The focus of this group is to educate the patient on the purpose and policies of crisis stabilization and provide a format to answer questions about their admission.  The group details unit policies and expectations of patients while admitted.    Participation Level:  Active  Participation Quality:  Appropriate  Affect:  Appropriate  Cognitive:  Appropriate  Insight: Appropriate  Engagement in Group:  Engaged  Modes of Intervention:  Discussion and Orientation  Additional Comments:    Koehn Salehi D Kanasia Gayman 12/08/2023, 9:54 AM

## 2023-12-08 NOTE — Progress Notes (Signed)
   12/08/23 0905  Psych Admission Type (Psych Patients Only)  Admission Status Voluntary  Psychosocial Assessment  Patient Complaints None  Eye Contact Fair  Facial Expression Animated  Affect Appropriate to circumstance  Speech Logical/coherent  Interaction Assertive  Motor Activity Other (Comment) (WNL)  Appearance/Hygiene Unremarkable  Behavior Characteristics Cooperative  Mood Anxious;Pleasant  Thought Process  Coherency WDL  Content Religiosity  Delusions Religious  Perception Hallucinations  Hallucination Tactile (Pt stated," I feel the presence of the [employer]".)  Judgment Limited  Confusion Mild  Danger to Self  Current suicidal ideation? Denies  Self-Injurious Behavior No self-injurious ideation or behavior indicators observed or expressed   Agreement Not to Harm Self Yes  Description of Agreement Verbal  Danger to Others  Danger to Others None reported or observed

## 2023-12-08 NOTE — BH Assessment (Signed)
 Patient is awake, stating he did sleep most of the day today. Pt is slightly anxious and ask for (and was given) prn hydroxyzine  and tylenol . Pt was cooperative tonight and attended group and snack with other patients. He denies depression and anxiety. Pt states he is still hearing the [facility] at times. Behavior is appropriate. 15 min checks maintained.

## 2023-12-08 NOTE — Progress Notes (Signed)
 [facility] MD Progress Note  12/08/2023 3:50 PM Nathan Holloway  MRN:  [id number]  Reason for admission: 28 year old African-American male with prior psychiatric diagnoses significant for schizoaffective disorder, bipolar-type, high risk medication use, nicotine  abuse, anxiety, insomnia & paranoid ideation. Patient presents voluntarily to the [facility] for worsening symptoms of schizoaffective disorder, bipolar-type (cycling mood)..   Daily notes: Nathan Holloway is seen, chart reviewed. The chart findings discussed with the treatment team. He presents with a good affect, good eye contact & verbally responsive. He presents well groomed. He reports, "I have no complaint today. I'm just doing good. The medicines I'm taking are working form. There are no side effects to report. The little problem I have is, I think the Trazodone  is making feel drowsy in the morning after I woke-up. I take it at night between 10:00 to 11:00 pm. Other than this, I'm doing good. Do you think I could be discharged tomorrow? I have not been to church since last Sunday & I do not  want miss church again this coming Sunday & it is Easter Sunday too". Norm currently denies any SIHI, AVH, delusional thoughts or paranoia. He does not appear to be responding to any internal stimuli. Although stating how well he is doing, patient is informed that it is unlikely that he will be discharged today or tomorrow  morning, his Depakote  ER level will be drawn (12-09-23). He is instructed to keep taking his medications & attend group sessions as recommended. There are no changes made on his current plan of care. Continue as already in progress. Vital signs reviewed. Blood pressure remains elevated. Patient is on Propranolol  10 mg po bid for HTN. Patient is currently in no apparent distress. His Trazodone  is scheduled to be given at 2000 to prevent morning drowsiness.  Principal Problem: Schizoaffective disorder, bipolar type (HCC)  Diagnosis: Principal Problem:    Schizoaffective disorder, bipolar type (HCC)  Total Time spent with patient: 45 minutes  Past Psychiatric History: See H&P.  Past Medical History:  Past Medical History:  Diagnosis Date   Manic behavior (HCC)    Psychotic affective disorder (HCC) 08/30/2015   History reviewed. No pertinent surgical history.  Family History:  Family History  Problem Relation Age of Onset   Healthy Mother    Family Psychiatric  History: See H&P.  Social History:  Social History   Substance and Sexual Activity  Alcohol Use No     Social History   Substance and Sexual Activity  Drug Use Not Currently   Types: Marijuana    Social History   Socioeconomic History   Marital status: Single    Spouse name: Not on file   Number of children: Not on file   Years of education: Not on file   Highest education level: Not on file  Occupational History   Not on file  Tobacco Use   Smoking status: Never   Smokeless tobacco: Never  Vaping Use   Vaping status: Every Day   Substances: Nicotine , Flavoring  Substance and Sexual Activity   Alcohol use: No   Drug use: Not Currently    Types: Marijuana   Sexual activity: Yes  Other Topics Concern   Not on file  Social History Narrative   Not on file   Social Drivers of Health   Financial Resource Strain: Low Risk  (09/18/2020)   Overall Financial Resource Strain (CARDIA)    Difficulty of Paying Living Expenses: Not very hard  Food Insecurity: No Food Insecurity (12/04/2023)  Hunger Vital Sign    Worried About Running Out of Food in the Last Year: Never true    Ran Out of Food in the Last Year: Never true  Transportation Needs: No Transportation Needs (12/04/2023)   PRAPARE - Administrator, Civil Service (Medical): No    Lack of Transportation (Non-Medical): No  Physical Activity: Sufficiently Active (09/18/2020)   Exercise Vital Sign    Days of Exercise per Week: 4 days    Minutes of Exercise per Session: 60 min  Stress: No  Stress Concern Present (09/18/2020)   [employer] of Occupational Health - Occupational Stress Questionnaire    Feeling of Stress : Not at all  Social Connections: Unknown (01/04/2022)   Received from [facility]   Social Network    Social Network: Not on file   Additional Social History:   Sleep: Good  Appetite:  Good  Current Medications: Current Facility-Administered Medications  Medication Dose Route Frequency Provider Last Rate Last Admin   acetaminophen  (TYLENOL ) tablet 650 mg  650 mg Oral Q6H PRN Coleman, Carolyn H, NP   650 mg at 12/08/23 0000   alum & mag hydroxide-simeth (MAALOX/MYLANTA) 200-200-20 MG/5ML suspension 30 mL  30 mL Oral Q4H PRN Coleman, Carolyn H, NP       ARIPiprazole  (ABILIFY ) tablet 10 mg  10 mg Oral Daily Ntuen, Tina C, FNP   10 mg at 12/08/23 0813   divalproex  (DEPAKOTE ) DR tablet 500 mg  500 mg Oral Q12H Ntuen, Tina C, FNP   500 mg at 12/08/23 1610   hydrOXYzine  (ATARAX ) tablet 25 mg  25 mg Oral TID PRN Coleman, Carolyn H, NP   25 mg at 12/08/23 0001   ibuprofen  (ADVIL ) tablet 400 mg  400 mg Oral Q6H PRN Ntuen, Tina C, FNP   400 mg at 12/06/23 0825   magnesium  hydroxide (MILK OF MAGNESIA) suspension 30 mL  30 mL Oral Daily PRN Coleman, Carolyn H, NP       OLANZapine  (ZYPREXA ) injection 10 mg  10 mg Intramuscular TID PRN Costella Dirks, NP       OLANZapine  (ZYPREXA ) injection 5 mg  5 mg Intramuscular TID PRN Coleman, Carolyn H, NP       OLANZapine  zydis (ZYPREXA ) disintegrating tablet 5 mg  5 mg Oral TID PRN Coleman, Carolyn H, NP       propranolol  (INDERAL ) tablet 10 mg  10 mg Oral BID Ntuen, Tina C, FNP   10 mg at 12/08/23 9604   traZODone  (DESYREL ) tablet 50 mg  50 mg Oral QHS PRN Asuncion Layer I, NP        Lab Results:  No results found for this or any previous visit (from the past 48 hours).   Blood Alcohol level:  Lab Results  Component Value Date   ETH <10 12/03/2023   ETH <10 02/22/2023    Metabolic Disorder  Labs: Lab Results  Component Value Date   HGBA1C 4.2 (L) 12/03/2023   MPG 73.84 12/03/2023   MPG 73.84 02/22/2023   Lab Results  Component Value Date   PROLACTIN 5.7 02/22/2023   PROLACTIN 11.2 04/07/2022   Lab Results  Component Value Date   CHOL 218 (H) 12/03/2023   TRIG 46 12/03/2023   HDL 75 12/03/2023   CHOLHDL 2.9 12/03/2023   VLDL 9 12/03/2023   LDLCALC 134 (H) 12/03/2023   LDLCALC 96 02/22/2023    Physical Findings: AIMS:  , ,  ,  ,  CIWA:    COWS:     Musculoskeletal: Strength & Muscle Tone: within normal limits Gait & Station: normal Patient leans: N/A  Psychiatric Specialty Exam:  Presentation  General Appearance:  Appropriate for Environment; Casual; Well Groomed  Eye Contact: Good  Speech: Clear and Coherent  Speech Volume: Normal  Handedness: Right   Mood and Affect  Mood: -- ("My mood is improving".)  Affect: Congruent   Thought Process  Thought Processes: Coherent; Goal Directed; Linear  Descriptions of Associations:Intact  Orientation:Full (Time, Place and Person)  Thought Content:Logical  History of Schizophrenia/Schizoaffective disorder:Yes  Duration of Psychotic Symptoms:Greater than six months  Hallucinations:Hallucinations: None Description of Auditory Hallucinations: NA Description of Visual Hallucinations: NA  Ideas of Reference:None  Suicidal Thoughts:Suicidal Thoughts: No  Homicidal Thoughts:Homicidal Thoughts: No  Sensorium  Memory: Immediate Good; Recent Good; Remote Good  Judgment: Fair  Insight: Fair  Art therapist  Concentration: Good  Attention Span: Good  Recall: Fair  Fund of Knowledge: Fair  Language: Good  Psychomotor Activity  Psychomotor Activity: Psychomotor Activity: Normal  Assets  Assets: Communication Skills; Desire for Improvement; Housing; Physical Health; Social Support   Sleep  Sleep: Sleep: Good Number of Hours of Sleep: 8  Physical  Exam: Physical Exam Vitals and nursing note reviewed.  HENT:     Nose: Nose normal.  Cardiovascular:     Rate and Rhythm: Normal rate.     Pulses: Normal pulses.  Pulmonary:     Effort: Pulmonary effort is normal.  Genitourinary:    Comments: Deferred Musculoskeletal:        General: Normal range of motion.     Cervical back: Normal range of motion.  Skin:    General: Skin is dry.  Neurological:     General: No focal deficit present.     Mental Status: He is oriented to person, place, and time.    Review of Systems  Constitutional:  Negative for chills, diaphoresis and fever.  HENT:  Negative for congestion and sore throat.   Respiratory:  Negative for cough, shortness of breath and wheezing.   Cardiovascular:  Negative for chest pain and palpitations.  Gastrointestinal:  Negative for abdominal pain, constipation, diarrhea, heartburn, nausea and vomiting.  Genitourinary:  Negative for dysuria.  Musculoskeletal:  Negative for joint pain and myalgias.  Neurological:  Negative for dizziness, tingling, tremors, sensory change, speech change, focal weakness, seizures, loss of consciousness, weakness and headaches.   Blood pressure (!) 147/99, pulse 68, temperature 97.7 F (36.5 C), temperature source Oral, resp. rate 18, height 5\' 9"  (1.753 m), weight 68.5 kg, SpO2 98%. Body mass index is 22.3 kg/m.  Treatment Plan Summary: Daily contact with patient to assess and evaluate symptoms and progress in treatment and Medication management.   Principal/active diagnosis. Schizoaffective disorder, bipolar type (HCC)   Plans: -Continue Abilify  10 mg po daily 12/06/2023 for mood control. Plan is to discharge Abilify  LAI. -Continue Depakote  500 mg p.o. twice daily.  Obtained Depakote  level on 12/09/2023 -Continue hydroxyzine  tablet 25 mg p.o. 3 times daily as needed for anxiety -Continue trazodone  50 mg po q hs for insomnia -Continue propranolol  10 mg p.o. twice daily for hypertension    Other PRN Medications  -Acetaminophen  650 mg every 6 as needed/mild pain  -Maalox 30 mL oral every 4 as needed/digestion  -Magnesium  hydroxide 30 mL daily as needed/mild constipation    Continue BH Agitation Protocol  --Haldol  5 mg, oral, 3 times daily as needed, mild agitation  --Benadryl  50 mg, oral,  3 times daily as needed, mild agitation                                      OR   --Haldol  injection 5 mg, IM, 3 times daily as needed, moderate agitation  --Benadryl  injection 50 mg, IM, 3 times daily as needed, moderate agitation  --Ativan  injection 2 mg, IM, 3 times daily as needed, moderate agitation                                      OR  --Haldol  injection 10 mg, IM, 3 times daily as needed, severe agitation  --Benadryl  injection 50 mg, IM, 3 times daily as needed, severe agitation  --Ativan  injection 2 mg, IM, 3 times daily as needed, severe agitation    --  The risks/benefits/side-effects/alternatives to this medication were discussed in detail with the patient and time was given for questions. The patient consents to medication trial.   -- Metabolic profile and EKG monitoring obtained while on an atypical antipsychotic (BMI: Lipid Panel: HbgA1c: QTc:)   -- Encouraged patient to participate in unit milieu and in scheduled group therapies      Asuncion Layer, NP, pmhnp, fnp-bc. 12/08/2023, 3:50 PM Patient ID: [name], male   DOB: February 21, 1996, 28 y.o.   MRN: [id number]

## 2023-12-08 NOTE — Progress Notes (Signed)
  Nathan Holloway   Type of Note: Resources  Patient was given information for -   - Low income apartments around/near the [place] - [employer] contact information - [employer] contact information/property information - [employer] information to get assistance with applying for disability  Signed:  Helmi Hechavarria, LCSW-A 12/08/2023  10:23 AM

## 2023-12-08 NOTE — Group Note (Signed)
 Recreation Therapy Group Note   Group Topic:Leisure Education  Group Date: 12/08/2023 Start Time: 0930 End Time: 1030 Facilitators: Destyni Hoppel-McCall, LRT,CTRS Location: [address] Day[address]   Group Topic: Leisure Education   Goal Area(s) Addresses:  Patient will successfully identify positive leisure and recreation activities.  Patient will acknowledge benefits of participation in healthy leisure activities post discharge.  Patient will actively work with peers toward a shared goal.   Intervention: Competitive Group Game   Activity: Guess The Lyric. The game consisted of 6 categories ([address] [place], Indie, [employer], Hip Hop and Dance). In groups of 3-4, patients took turns flicking the spinner. Which every category, the team landed on, they had to finish the lyric to a song from that category. If they guessed the lyric correctly, the team keeps the card. There were also spaces on the spinner where a group could randomly pick a category of their choosing or steal a card from another team. The team with the most cards, wins the game.   Education:  Teacher, English as a foreign language, Stress Management, Discharge Planning  Education Outcome: Acknowledges education/In group clarification offered/Needs additional education   Affect/Mood: Appropriate   Participation Level: Minimal   Participation Quality: Independent   Behavior: Attentive    Speech/Thought Process: Relevant   Insight: Moderate   Judgement: Moderate   Modes of Intervention: Competitive Play   Patient Response to Interventions:  Attentive   Education Outcome:  In group clarification offered    Clinical Observations/Individualized Feedback: Pt came in late to group. Pt was more observant and attentive to peers. Pt took two turns in trying to guess the lyrics. Pt was able to get one of the answers correct.    Plan: Continue to engage patient in RT group sessions 2-3x/week.   Derrell Milanes-McCall, LRT,CTRS 12/08/2023  12:49 PM

## 2023-12-08 NOTE — Group Note (Signed)
 Date:  12/08/2023 Time:  8:52 PM  Group Topic/Focus:  Wrap-Up Group:   The focus of this group is to help patients review their daily goal of treatment and discuss progress on daily workbooks.    Participation Level:  Active  Participation Quality:  Appropriate  Affect:  Appropriate  Cognitive:  Appropriate  Insight: Appropriate  Engagement in Group:  Engaged  Modes of Intervention:  Activity and Socialization  Additional Comments:  Patient attended wrap up group.  Eward Mace 12/08/2023, 8:52 PM

## 2023-12-08 NOTE — [facility] Group Notes (Signed)
 [facility] Group Notes:  (Nursing/MHT/Case Management/Adjunct)  Date:  12/08/2023  Time:  8:30 PM  Type of Therapy:   Wrap Up Group  Participation Level:  Active  Participation Quality:  Appropriate  Affect:  Appropriate  Cognitive:  Appropriate  Insight:  Appropriate and Improving  Engagement in Group:  Engaged  Modes of Intervention:  Discussion and Support  Summary of Progress/Problems: Patient participated in group appropriately.  Nathan Holloway 12/08/2023, 8:30 PM

## 2023-12-08 NOTE — Plan of Care (Signed)
 ?  Problem: Activity: ?Goal: Interest or engagement in activities will improve ?Outcome: Progressing ?Goal: Sleeping patterns will improve ?Outcome: Progressing ?  ?Problem: Coping: ?Goal: Ability to verbalize frustrations and anger appropriately will improve ?Outcome: Progressing ?Goal: Ability to demonstrate self-control will improve ?Outcome: Progressing ?  ?Problem: Safety: ?Goal: Periods of time without injury will increase ?Outcome: Progressing ?  ?

## 2023-12-08 NOTE — Group Note (Signed)
 Date:  12/08/2023 Time:  4:30 PM  Setbacks in Recovery: The purpose of the group was to utilize concepts from CBT therapy to promote emotional wellness. More specifically, we identified and defined unhelpful thought patterns that could be the first step in changing the way participants think while in recovery from a mental health crisis.    Participation Level:  Active  Participation Quality:  Appropriate  Affect:  Appropriate  Cognitive:  Appropriate  Insight: Appropriate  Engagement in Group:  Engaged  Modes of Intervention:  Discussion  Additional Comments:    Ouita Nish D Catharina Pica 12/08/2023, 4:30 PM

## 2023-12-09 DIAGNOSIS — F250 Schizoaffective disorder, bipolar type: Secondary | ICD-10-CM | POA: Diagnosis not present

## 2023-12-09 LAB — VALPROIC ACID LEVEL: Valproic Acid Lvl: 86 ug/mL (ref 50.0–100.0)

## 2023-12-09 MED ORDER — HYDROXYZINE HCL 25 MG PO TABS: 25.0000 mg | ORAL_TABLET | Freq: Three times a day (TID) | ORAL | 0 refills | Status: AC | PRN

## 2023-12-09 MED ORDER — DIVALPROEX SODIUM 500 MG PO DR TAB: 500.0000 mg | DELAYED_RELEASE_TABLET | Freq: Two times a day (BID) | ORAL | 0 refills | Status: DC

## 2023-12-09 MED ORDER — ARIPIPRAZOLE 10 MG PO TABS
10.0000 mg | ORAL_TABLET | Freq: Every day | ORAL | Status: DC
  Administered 2023-12-10: 10 mg via ORAL
  Filled 2023-12-09 (×3): qty 1

## 2023-12-09 MED ORDER — ARIPIPRAZOLE ER 400 MG IM PRSY: 400.0000 mg | PREFILLED_SYRINGE | INTRAMUSCULAR | 0 refills | Status: AC

## 2023-12-09 MED ORDER — PROPRANOLOL HCL 10 MG PO TABS: 10.0000 mg | ORAL_TABLET | Freq: Two times a day (BID) | ORAL | 0 refills | Status: AC

## 2023-12-09 MED ORDER — ARIPIPRAZOLE 10 MG PO TABS: 10.0000 mg | ORAL_TABLET | Freq: Every day | ORAL | 0 refills | Status: AC

## 2023-12-09 MED ORDER — ARIPIPRAZOLE ER 400 MG IM PRSY
400.0000 mg | PREFILLED_SYRINGE | INTRAMUSCULAR | Status: DC
  Administered 2023-12-09: 400 mg via INTRAMUSCULAR
  Filled 2023-12-09: qty 400

## 2023-12-09 NOTE — Progress Notes (Signed)
   12/09/23 2220  Psych Admission Type (Psych Patients Only)  Admission Status Voluntary  Psychosocial Assessment  Patient Complaints None  Eye Contact Fair  Facial Expression Animated  Affect Appropriate to circumstance  Speech Logical/coherent  Interaction Assertive  Motor Activity Other (Comment) (WDL)  Appearance/Hygiene Unremarkable  Behavior Characteristics Appropriate to situation  Mood Pleasant  Thought Process  Coherency WDL  Content WDL  Delusions None reported or observed  Perception WDL  Hallucination None reported or observed  Judgment Impaired  Confusion None  Danger to Self  Current suicidal ideation? Denies  Self-Injurious Behavior No self-injurious ideation or behavior indicators observed or expressed   Agreement Not to Harm Self Yes  Description of Agreement verbal  Danger to Others  Danger to Others None reported or observed

## 2023-12-09 NOTE — Plan of Care (Signed)
  Problem: Activity: Goal: Interest or engagement in activities will improve Outcome: Progressing Goal: Sleeping patterns will improve Outcome: Progressing   Problem: Coping: Goal: Ability to verbalize frustrations and anger appropriately will improve Outcome: Progressing Goal: Ability to demonstrate self-control will improve Outcome: Progressing   Problem: Physical Regulation: Goal: Ability to maintain clinical measurements within normal limits will improve Outcome: Progressing   Problem: Safety: Goal: Periods of time without injury will increase Outcome: Progressing

## 2023-12-09 NOTE — Progress Notes (Addendum)
 [facility] MD Progress Note  12/09/2023 1:28 PM Nathan Holloway  MRN:  [id number]  Reason for admission: 28 year old African-American male with prior psychiatric diagnoses significant for schizoaffective disorder, bipolar-type, high risk medication use, nicotine  abuse, anxiety, insomnia & paranoid ideation. Patient presents voluntarily to the [facility] for worsening symptoms of schizoaffective disorder, bipolar-type (cycling mood)..   Daily notes: Nathan Holloway is seen, chart reviewed. The chart findings discussed with the treatment team. He presents with a good affect, good eye contact & verbally responsive. He presents well groomed.  He reports overall doing well.  Denies SI/HI/AVH.  Reports eating and sleeping well.  Continues to be amenable to receiving Abilify  Maintena injection.  He did inquire about clarifying diagnoses and I discussed all with indicate in the chart but stated that he should work with his outpatient psychiatrist regularly to identify and clarify any changes to his ongoing diagnoses.  He was amenable to this plan.  He was amenable to me calling mom to ensure that there are no acute further concerns at this time.    Collateral Nathan Holloway (mom) Mom reports he is coherent and overall doing much better than on admission. She feels if he continues to medication, he will do well.  Mom was amenable to plan of starting long acting injectable and is aware of his medication regiment. Mom is aware of appointment with [facility] upon discharge for medication management.  All questions were addressed and mom was amenable to discharge tomorrow.  Principal Problem: Schizoaffective disorder, bipolar type (HCC)  Diagnosis: Principal Problem:   Schizoaffective disorder, bipolar type (HCC)  Total Time spent with patient: 45 minutes  Past Psychiatric History: See H&P.  Past Medical History:  Past Medical History:  Diagnosis Date   Manic behavior (HCC)    Psychotic affective disorder (HCC) 08/30/2015    History reviewed. No pertinent surgical history.  Family History:  Family History  Problem Relation Age of Onset   Healthy Mother    Family Psychiatric  History: See H&P.  Social History:  Social History   Substance and Sexual Activity  Alcohol Use No     Social History   Substance and Sexual Activity  Drug Use Not Currently   Types: Marijuana    Social History   Socioeconomic History   Marital status: Single    Spouse name: Not on file   Number of children: Not on file   Years of education: Not on file   Highest education level: Not on file  Occupational History   Not on file  Tobacco Use   Smoking status: Never   Smokeless tobacco: Never  Vaping Use   Vaping status: Every Day   Substances: Nicotine , Flavoring  Substance and Sexual Activity   Alcohol use: No   Drug use: Not Currently    Types: Marijuana   Sexual activity: Yes  Other Topics Concern   Not on file  Social History Narrative   Not on file   Social Drivers of Health   Financial Resource Strain: Low Risk  (09/18/2020)   Overall Financial Resource Strain (CARDIA)    Difficulty of Paying Living Expenses: Not very hard  Food Insecurity: No Food Insecurity (12/04/2023)   Hunger Vital Sign    Worried About Running Out of Food in the Last Year: Never true    Ran Out of Food in the Last Year: Never true  Transportation Needs: No Transportation Needs (12/04/2023)   PRAPARE - Administrator, Civil Service (Medical): No  Lack of Transportation (Non-Medical): No  Physical Activity: Sufficiently Active (09/18/2020)   Exercise Vital Sign    Days of Exercise per Week: 4 days    Minutes of Exercise per Session: 60 min  Stress: No Stress Concern Present (09/18/2020)   [employer] of Occupational Health - Occupational Stress Questionnaire    Feeling of Stress : Not at all  Social Connections: Unknown (01/04/2022)   Received from [facility]   Social Network    Social  Network: Not on file   Additional Social History:   Sleep: Good  Appetite:  Good  Current Medications: Current Facility-Administered Medications  Medication Dose Route Frequency Provider Last Rate Last Admin   acetaminophen  (TYLENOL ) tablet 650 mg  650 mg Oral Q6H PRN Coleman, Carolyn H, NP   650 mg at 12/08/23 0000   alum & mag hydroxide-simeth (MAALOX/MYLANTA) 200-200-20 MG/5ML suspension 30 mL  30 mL Oral Q4H PRN Costella Dirks, NP       [START ON 12/10/2023] ARIPiprazole  (ABILIFY ) tablet 10 mg  10 mg Oral Daily Augusta Blizzard, MD       ARIPiprazole  ER (ABILIFY  MAINTENA) 400 MG prefilled syringe 400 mg  400 mg Intramuscular Q28 days Augusta Blizzard, MD   400 mg at 12/09/23 1116   divalproex  (DEPAKOTE ) DR tablet 500 mg  500 mg Oral Q12H Ntuen, Tina C, FNP   500 mg at 12/09/23 0810   hydrOXYzine  (ATARAX ) tablet 25 mg  25 mg Oral TID PRN Coleman, Carolyn H, NP   25 mg at 12/08/23 0001   ibuprofen  (ADVIL ) tablet 400 mg  400 mg Oral Q6H PRN Ntuen, Tina C, FNP   400 mg at 12/06/23 0825   magnesium  hydroxide (MILK OF MAGNESIA) suspension 30 mL  30 mL Oral Daily PRN Coleman, Carolyn H, NP       OLANZapine  (ZYPREXA ) injection 10 mg  10 mg Intramuscular TID PRN Costella Dirks, NP       OLANZapine  (ZYPREXA ) injection 5 mg  5 mg Intramuscular TID PRN Coleman, Carolyn H, NP       OLANZapine  zydis (ZYPREXA ) disintegrating tablet 5 mg  5 mg Oral TID PRN Coleman, Carolyn H, NP       propranolol  (INDERAL ) tablet 10 mg  10 mg Oral BID Ntuen, Tina C, FNP   10 mg at 12/09/23 0810   traZODone  (DESYREL ) tablet 50 mg  50 mg Oral QHS PRN Asuncion Layer I, NP        Lab Results:  Results for orders placed or performed during the hospital encounter of 12/04/23 (from the past 48 hours)  Valproic  acid level     Status: None   Collection Time: 12/09/23  6:29 AM  Result Value Ref Range   Valproic  Acid Lvl 86 50.0 - 100.0 ug/mL    Comment: Performed at [facility], [address],  [place], [place] [address]     Blood Alcohol level:  Lab Results  Component Value Date   [facility] <10 12/03/2023   ETH <10 02/22/2023    Metabolic Disorder Labs: Lab Results  Component Value Date   HGBA1C 4.2 (L) 12/03/2023   MPG 73.84 12/03/2023   MPG 73.84 02/22/2023   Lab Results  Component Value Date   PROLACTIN 5.7 02/22/2023   PROLACTIN 11.2 04/07/2022   Lab Results  Component Value Date   CHOL 218 (H) 12/03/2023   TRIG 46 12/03/2023   HDL 75 12/03/2023   CHOLHDL 2.9 12/03/2023   VLDL  9 12/03/2023   LDLCALC 134 (H) 12/03/2023   LDLCALC 96 02/22/2023    Physical Findings: AIMS:  , ,  ,  ,    CIWA:    COWS:     Musculoskeletal: Strength & Muscle Tone: within normal limits Gait & Station: normal Patient leans: N/A  Psychiatric Specialty Exam:  Presentation  General Appearance:  Appropriate for Environment; Casual; Well Groomed  Eye Contact: Good  Speech: Clear and Coherent  Speech Volume: Normal  Handedness: Right   Mood and Affect  Mood: -- ("My mood is improving".)  Affect: Congruent   Thought Process  Thought Processes: Coherent; Goal Directed; Linear  Descriptions of Associations:Intact  Orientation:Full (Time, Place and Person)  Thought Content:Logical  History of Schizophrenia/Schizoaffective disorder:Yes  Duration of Psychotic Symptoms:Greater than six months  Hallucinations:Hallucinations: None Description of Auditory Hallucinations: NA Description of Visual Hallucinations: NA  Ideas of Reference:None  Suicidal Thoughts:Suicidal Thoughts: No  Homicidal Thoughts:Homicidal Thoughts: No  Sensorium  Memory: Immediate Good; Recent Good; Remote Good  Judgment: Fair  Insight: Fair  Art therapist  Concentration: Good  Attention Span: Good  Recall: Fair  Fund of Knowledge: Fair  Language: Good  Psychomotor Activity  Psychomotor Activity: Psychomotor Activity: Normal  Assets   Assets: Communication Skills; Desire for Improvement; Housing; Physical Health; Social Support   Sleep  Sleep: Sleep: Good Number of Hours of Sleep: 8  Physical Exam: Physical Exam Vitals and nursing note reviewed.  HENT:     Nose: Nose normal.  Cardiovascular:     Rate and Rhythm: Normal rate.     Pulses: Normal pulses.  Pulmonary:     Effort: Pulmonary effort is normal.  Genitourinary:    Comments: Deferred Musculoskeletal:        General: Normal range of motion.     Cervical back: Normal range of motion.  Skin:    General: Skin is dry.  Neurological:     General: No focal deficit present.     Mental Status: He is oriented to person, place, and time.    Review of Systems  Constitutional:  Negative for chills, diaphoresis and fever.  HENT:  Negative for congestion and sore throat.   Respiratory:  Negative for cough, shortness of breath and wheezing.   Cardiovascular:  Negative for chest pain and palpitations.  Gastrointestinal:  Negative for abdominal pain, constipation, diarrhea, heartburn, nausea and vomiting.  Genitourinary:  Negative for dysuria.  Musculoskeletal:  Negative for joint pain and myalgias.  Neurological:  Negative for dizziness, tingling, tremors, sensory change, speech change, focal weakness, seizures, loss of consciousness, weakness and headaches.   Blood pressure 136/81, pulse 76, temperature 98 F (36.7 C), temperature source Oral, resp. rate 18, height 5\' 9"  (1.753 m), weight 68.5 kg, SpO2 99%. Body mass index is 22.3 kg/m.  Treatment Plan Summary: Daily contact with patient to assess and evaluate symptoms and progress in treatment and Medication management.   Principal/active diagnosis. Schizoaffective disorder, bipolar type (HCC)   Plans: -Continue Abilify  10 mg po daily 12/06/2023 for mood control. Plan is to discharge Abilify  LAI. -Continue Depakote  500 mg p.o. twice daily.  Obtained Depakote  level on 12/09/2023 -Continue hydroxyzine   tablet 25 mg p.o. 3 times daily as needed for anxiety -Continue trazodone  50 mg po q hs for insomnia -Continue propranolol  10 mg p.o. twice daily for hypertension   Other PRN Medications  -Acetaminophen  650 mg every 6 as needed/mild pain  -Maalox 30 mL oral every 4 as needed/digestion  -Magnesium  hydroxide 30 mL  daily as needed/mild constipation    Continue BH Agitation Protocol  --Haldol  5 mg, oral, 3 times daily as needed, mild agitation  --Benadryl  50 mg, oral, 3 times daily as needed, mild agitation                                      OR   --Haldol  injection 5 mg, IM, 3 times daily as needed, moderate agitation  --Benadryl  injection 50 mg, IM, 3 times daily as needed, moderate agitation  --Ativan  injection 2 mg, IM, 3 times daily as needed, moderate agitation                                      OR  --Haldol  injection 10 mg, IM, 3 times daily as needed, severe agitation  --Benadryl  injection 50 mg, IM, 3 times daily as needed, severe agitation  --Ativan  injection 2 mg, IM, 3 times daily as needed, severe agitation    --  The risks/benefits/side-effects/alternatives to this medication were discussed in detail with the patient and time was given for questions. The patient consents to medication trial.   -- Metabolic profile and EKG monitoring obtained while on an atypical antipsychotic (BMI: Lipid Panel: HbgA1c: QTc:)   -- Encouraged patient to participate in unit milieu and in scheduled group therapies      Augusta Blizzard, MD, 12/09/2023, 1:28 PM Patient ID: [name], male   DOB: [birth year], 28 y.o.   MRN: [id number]

## 2023-12-09 NOTE — Plan of Care (Signed)
   Problem: Education: Goal: Emotional status will improve Outcome: Progressing   Problem: Activity: Goal: Interest or engagement in activities will improve Outcome: Progressing

## 2023-12-09 NOTE — Progress Notes (Signed)
 Patient rated his anxiety level 0/10 and his depression level 0/10 with 10 being the highest and 0 none. Patient identified his goal for today as, " Serving others and helping where I'm applicable". Medication and group compliant. Patient observed interacting well with some peers. Pt tolerated Abilify  Maintena 40 mg IM well. Appetite good on shift. Safety maintained.  12/09/23 0905  Psych Admission Type (Psych Patients Only)  Admission Status Voluntary  Psychosocial Assessment  Patient Complaints None  Eye Contact Fair  Facial Expression Animated  Affect Appropriate to circumstance  Speech Logical/coherent  Interaction Assertive  Motor Activity Other (Comment) (WNL)  Appearance/Hygiene Unremarkable  Behavior Characteristics Cooperative  Mood Anxious;Pleasant  Thought Process  Coherency WDL  Content WDL  Delusions None reported or observed  Perception WDL  Hallucination None reported or observed  Judgment Impaired  Confusion None  Danger to Self  Current suicidal ideation? Denies  Self-Injurious Behavior No self-injurious ideation or behavior indicators observed or expressed   Agreement Not to Harm Self Yes  Description of Agreement Verbal  Danger to Others  Danger to Others None reported or observed

## 2023-12-09 NOTE — Group Note (Signed)
  LCSW Group Therapy Note  Group Date: 12/09/2023 Start Time: 10:00am End Time: 11:10am  Group Therapy:   Anger Management Skills  Participation Level:  Active  Description of Group:   In this group, patients learned how to recognize the physical, cognitive, emotional, and behavioral responses they have to anger-provoking situations.  They identified a recent time they became angry and how they reacted.  They analyzed how their reaction was possibly beneficial and how it was possibly unhelpful.  The group discussed a variety of healthier coping skills that could help with such a situation in the future.  Focus was placed on how helpful it is to recognize the underlying emotions to our anger, because working on those can lead to a more permanent solution as well as our ability to focus on the important rather than the urgent.  The group explored the issue of boundaries and long-term resentments as related to their anger.  CSW taught briefly about states of mind and reviewed TIPP skills, practicing a couple of these with the group as a whole.   Therapeutic Goals: Patients will identify someone/something that typically triggers them to anger as well as their typical coping skill, whether healthy or unhealthy. Patients will learn that anger is a secondary emotion and will see how working on the primary emotion is beneficial in the long-term to deal with angry reactions. Patients will learn basic CBT concept of Thought-Feeling-Action sequence and how helpful it is to challenge the thought in order to have a different feeling and behavior. Patients will be introduced to DBT concept of Emotional/Rational/Wise Mind and how to use TIPP skills to rapidly exit emotional state. Patients will explore possible new behaviors to use in future anger situations.   Summary of Patient Progress:  Patient was active during the group. Patient shared that he typically becomes angry when he calmly explains the Bible to  his family but they react poorly and say he needs to be hospitalized and his reaction is often to listen to music, shut down, and let it go. He demonstrated good insight into the subject matter, was respectful of peers, and participated fully and enthusiastically throughout the entire session.  At the conclusion of group, patient shared a willingness to try implementing boundaries and speak quietly in the future to improve his anger management.  Therapeutic Modalities:   Cognitive Behavioral Therapy Dialectical Behavioral Therapy Processing  Ancel Kass, LCSW 12/09/2023  11:49 AM

## 2023-12-10 DIAGNOSIS — F250 Schizoaffective disorder, bipolar type: Secondary | ICD-10-CM | POA: Diagnosis not present

## 2023-12-10 NOTE — Progress Notes (Signed)
   12/10/23 0545  15 Minute Checks  Location Bedroom  Visual Appearance Calm  Behavior Sleeping  Sleep (Behavioral Health Patients Only)  Calculate sleep? (Click Yes once per 24 hr at 0600 safety check) Yes  Documented sleep last 24 hours 7.75

## 2023-12-10 NOTE — Progress Notes (Signed)
 Discharge Note:  Patient discharged with mother to home.  Suicide prevention information given and discussed with patient who stated he understood and had no questions.  Patient denied SI and HI.  Denied A/V hallucinations.  Patient stated he appreciated all assistance received from [facility] staff.  Patient stated he received all his belongings, clothing, toiletries, misc items, etc.  All discharge information given patient.

## 2023-12-10 NOTE — Progress Notes (Signed)
 D:  Patient denied SI and HI, contracts for safety.  Denied A/V hallucinations.  Denied pain. A:  Medications administered per MD orders.  Emotional support and encouragement given patient. R:  Safety maintained with 15 minute checks.

## 2023-12-10 NOTE — [facility] Suicide Risk Assessment (Signed)
 [facility] Discharge Suicide Risk Assessment   Principal Problem: Schizoaffective disorder, bipolar type [facility]) Discharge Diagnoses: Principal Problem:   Schizoaffective disorder, bipolar type (HCC)   Reason for Admission: 28 year old African-American male with prior psychiatric diagnoses significant for schizoaffective disorder, bipolar-type, medication noncompliance, nicotine  abuse, and anxiety presents voluntarily to the [facility] for worsening symptoms of schizoaffective disorder, bipolar-type including AH of holy spirit telling him to harm self and others.  Hospital Course  During the patient's hospitalization, patient had extensive initial psychiatric evaluation, and follow-up psychiatric evaluations every day.  Psychiatric diagnoses provided upon initial assessment:  Schizoaffective Disorder, Bipolar Type  Patient's psychiatric medications were adjusted on admission:  -started Abilify  5 mg daily for psychosis -start Depakote  500 mg twice daily for mood stabilization -start trazodone  50 mg at bedtime for sleep -start propranolol  10 mg twice a day for HTN  During the hospitalization, other adjustments were made to the patient's psychiatric medication regimen:  -increased abilify  to 10 mg daily -transitioned to Abilify  Maintena 400 mg every 28 days on 12/09/23  Patient's care was discussed during the interdisciplinary team meeting every day during the hospitalization.  The patient denies having side effects to prescribed psychiatric medication.  Gradually, patient started adjusting to milieu. The patient was evaluated each day by a clinical provider to ascertain response to treatment. Improvement was noted by the patient's report of decreasing symptoms, improved sleep and appetite, affect, medication tolerance, behavior, and participation in unit programming.  Patient was asked each day to complete a self inventory noting mood, mental status, pain, new symptoms, anxiety and concerns.     Symptoms were reported as significantly decreased or resolved completely by discharge.   On day of discharge, the patient reports that their mood is stable. The patient denied having suicidal thoughts for more than 48 hours prior to discharge.  Patient denies having homicidal thoughts.  Patient denies having auditory hallucinations.  Patient denies any visual hallucinations or other symptoms of psychosis. The patient was motivated to continue taking medication with a goal of continued improvement in mental health.   The patient reports their target psychiatric symptoms of psychosis and mania responded well to the psychiatric medications, and the patient reports overall benefit other psychiatric hospitalization. Supportive psychotherapy was provided to the patient. The patient also participated in regular group therapy while hospitalized. Coping skills, problem solving as well as relaxation therapies were also part of the unit programming.  Labs were reviewed with the patient, and abnormal results were discussed with the patient.  The patient is able to verbalize their individual safety plan to this provider.  # It is recommended to the patient to continue psychiatric medications as prescribed, after discharge from the hospital.    # It is recommended to the patient to follow up with your outpatient psychiatric provider and PCP.  # It was discussed with the patient, the impact of alcohol, drugs, tobacco have been there overall psychiatric and medical wellbeing, and total abstinence from substance use was recommended the patient.ed.  # Prescriptions provided or sent directly to preferred pharmacy at discharge. Patient agreeable to plan. Given opportunity to ask questions. Appears to feel comfortable with discharge.    # In the event of worsening symptoms, the patient is instructed to call the crisis hotline, 911 and or go to the nearest ED for appropriate evaluation and treatment of symptoms. To  follow-up with primary care provider for other medical issues, concerns and or health care needs  # Patient was discharged home with a  plan to follow up as noted below.   Total Time spent with patient: 1 hour  Musculoskeletal: Strength & Muscle Tone: within normal limits Gait & Station: normal Patient leans: N/A  Psychiatric Specialty Exam  Presentation  General Appearance: Appropriate for Environment; Casual   Eye Contact:Good   Speech:Clear and Coherent; Normal Rate   Speech Volume:Normal   Handedness:Right    Mood and Affect  Mood:Euthymic   Duration of Depression Symptoms: Greater than two weeks   Affect:Appropriate; Congruent    Thought Process  Thought Processes:Coherent; Goal Directed; Linear   Descriptions of Associations:Intact   Orientation:Full (Time, Place and Person)   Thought Content:Logical   History of Schizophrenia/Schizoaffective disorder:Yes   Duration of Psychotic Symptoms:Greater than six months   Hallucinations:Hallucinations: None  Ideas of Reference:None   Suicidal Thoughts:Suicidal Thoughts: No  Homicidal Thoughts:Homicidal Thoughts: No   Sensorium  Memory:Immediate Good; Recent Good; Remote Good   Judgment:Fair   Insight:Fair    Executive Functions  Concentration:Good   Attention Span:Good   Recall:Good   Fund of Knowledge:Good   Language:Good    Psychomotor Activity  Psychomotor Activity:Psychomotor Activity: Normal   Assets  Assets:Communication Skills; Desire for Improvement; Physical Health    Sleep  Sleep:Sleep: Good   Physical Exam: Physical Exam ROS Blood pressure (!) 141/87, pulse 88, temperature 98.5 F (36.9 C), temperature source Oral, resp. rate 18, height 5\' 9"  (1.753 m), weight 68.5 kg, SpO2 99%. Body mass index is 22.3 kg/m.  Mental Status Per Nursing Assessment::   On Admission:  NA  Demographic Factors:  Male  Loss Factors: NA  Historical  Factors: NA  Risk Reduction Factors:   Living with another person, especially a relative, Positive social support, Positive therapeutic relationship, and Positive coping skills or problem solving skills  Continued Clinical Symptoms:  Severe Anxiety and/or Agitation More than one psychiatric diagnosis Previous Psychiatric Diagnoses and Treatments  Cognitive Features That Contribute To Risk:  None    Suicide Risk:  Minimal: No identifiable suicidal ideation.  Patients presenting with no risk factors but with morbid ruminations; may be classified as minimal risk based on the severity of the depressive symptoms   Follow-up Information     [facility] Go on 01/02/2024.   Why: You have an appointment for medication management services on 01/02/24 at 2:30 pm. The appointment will be held in person, but you may switch to Virtual. Contact information: [address] [place] [address] [phone]         [facility] Follow up on 12/13/2023.   Why: Please call this provider personally on 12/13/23 at 9:00 am, to schedule an appointment for therapy services as soon as possible. Contact information: [address] [place], [place] [address] [phone]                 Plan Of Care/Follow-up recommendations:  Activity: as tolerated  Diet: heart healthy  Other: -Follow-up with your outpatient psychiatric provider -instructions on appointment date, time, and address (location) are provided to you in discharge paperwork.  -Take your psychiatric medications as prescribed at discharge - instructions are provided to you in the discharge paperwork  -Follow-up with outpatient primary care doctor and other specialists -for management of chronic medical disease, including: hypertension and dyslipidemia  -Testing: Follow-up with outpatient provider for abnormal lab results:  elevated cholesterol LDL cholesterol  134 Elevated creatinine 1.51  -Recommend abstinence from alcohol, tobacco, and other illicit drug use at  discharge.   -If your psychiatric symptoms recur, worsen, or if you have side effects to your psychiatric medications, call your outpatient psychiatric provider, 911, [phone] or go to the nearest emergency department.  -If suicidal thoughts recur, call your outpatient psychiatric provider, 911, [phone] or go to the nearest emergency department.   Augusta Blizzard, MD 12/10/2023, 8:44 AM

## 2023-12-10 NOTE — Group Note (Signed)
 Date:  12/10/2023 Time:  9:17 AM  Group Topic/Focus:  Goals Group:   The focus of this group is to help patients establish daily goals to achieve during treatment and discuss how the patient can incorporate goal setting into their daily lives to aide in recovery. Orientation:   The focus of this group is to educate the patient on the purpose and policies of crisis stabilization and provide a format to answer questions about their admission.  The group details unit policies and expectations of patients while admitted.    Participation Level:  Active  Participation Quality:  Appropriate  Affect:  Appropriate  Cognitive:  Appropriate  Insight: Good  Engagement in Group:  Engaged  Modes of Intervention:  Orientation  Additional Comments:  Goal is to smile and help others  Violette Grief 12/10/2023, 9:17 AM

## 2023-12-10 NOTE — Discharge Summary (Signed)
 Physician Discharge Summary Note Patient:  Nathan Holloway is an 28 y.o., male MRN:  [id number] DOB:  Mar 23, 1996 Patient phone:  [phone] (home)  Patient address:   [address] [place] [address],  Total Time spent with patient: 1 hour  Date of Admission:  12/04/2023 Date of Discharge: 12/10/23  Reason for Admission: 28 year old African-American male with prior psychiatric diagnoses significant for schizoaffective disorder, bipolar-type, medication noncompliance, nicotine  abuse, and anxiety presents voluntarily to the [facility] for worsening symptoms of schizoaffective disorder, bipolar-type including AH of holy spirit telling him to harm self and others.  Hospital Course  During the patient's hospitalization, patient had extensive initial psychiatric evaluation, and follow-up psychiatric evaluations every day.  Psychiatric diagnoses provided upon initial assessment:  Schizoaffective Disorder, Bipolar Type  Patient's psychiatric medications were adjusted on admission:  -started Abilify  5 mg daily for psychosis -start Depakote  500 mg twice daily for mood stabilization -start trazodone  50 mg at bedtime for sleep -start propranolol  10 mg twice a day for HTN  During the hospitalization, other adjustments were made to the patient's psychiatric medication regimen:  -increased abilify  to 10 mg daily -transitioned to Abilify  Maintena 400 mg every 28 days on 12/09/23  Patient's care was discussed during the interdisciplinary team meeting every day during the hospitalization.  The patient denies having side effects to prescribed psychiatric medication.  Gradually, patient started adjusting to milieu. The patient was evaluated each day by a clinical provider to ascertain response to treatment. Improvement was noted by the patient's report of decreasing symptoms, improved sleep and appetite, affect, medication tolerance, behavior, and participation in unit programming.  Patient was asked  each day to complete a self inventory noting mood, mental status, pain, new symptoms, anxiety and concerns.    Symptoms were reported as significantly decreased or resolved completely by discharge.   On day of discharge, the patient reports that their mood is stable. The patient denied having suicidal thoughts for more than 48 hours prior to discharge.  Patient denies having homicidal thoughts.  Patient denies having auditory hallucinations.  Patient denies any visual hallucinations or other symptoms of psychosis. The patient was motivated to continue taking medication with a goal of continued improvement in mental health.   The patient reports their target psychiatric symptoms of psychosis and mania responded well to the psychiatric medications, and the patient reports overall benefit other psychiatric hospitalization. Supportive psychotherapy was provided to the patient. The patient also participated in regular group therapy while hospitalized. Coping skills, problem solving as well as relaxation therapies were also part of the unit programming.  Labs were reviewed with the patient, and abnormal results were discussed with the patient.  The patient is able to verbalize their individual safety plan to this provider.  # It is recommended to the patient to continue psychiatric medications as prescribed, after discharge from the hospital.    # It is recommended to the patient to follow up with your outpatient psychiatric provider and PCP.  # It was discussed with the patient, the impact of alcohol, drugs, tobacco have been there overall psychiatric and medical wellbeing, and total abstinence from substance use was recommended the patient.ed.  # Prescriptions provided or sent directly to preferred pharmacy at discharge. Patient agreeable to plan. Given opportunity to ask questions. Appears to feel comfortable with discharge.    # In the event of worsening symptoms, the patient is instructed to call  the crisis hotline, 911 and or go to the nearest ED for appropriate evaluation and  treatment of symptoms. To follow-up with primary care provider for other medical issues, concerns and or health care needs  # Patient was discharged home with a plan to follow up as noted below.    Principal Problem: Schizoaffective disorder, bipolar type [facility]) Discharge Diagnoses: Principal Problem:   Schizoaffective disorder, bipolar type (HCC)   Past Psychiatric History: Previous Psych Diagnoses: Schizoaffective disorder, bipolar type, high risk medication use, nicotine  abuse, anxiety state, paranoid ideation, and insomnia Prior inpatient treatment: Yes, x 7.  Was recently discharged from [facility] in July 2024 Current/prior outpatient treatment: Yes at [facility] [place] Prior rehab hx: Denies Psychotherapy hx: Yes History of suicide: History of suicide attempt several times as a child. History of homicide or aggression: Denies Psychiatric medication history: Patient has been on trial Zyprexa , Depakote , Risperdal, Abilify , and trazodone  Psychiatric medication compliance history: Noncompliance Neuromodulation history: Denies Current Psychiatrist: Denies Current therapist: Yes at [facility]   Substance Abuse Hx: Alcohol: Denies alcohol drinking Tobacco: Reports vaping nicotine  with last vape July for 2023 Illicit drugs: Last drug use 2021 Rx drug abuse: Denies Rehab hx: Denies  Past Medical History:  Past Medical History:  Diagnosis Date   Manic behavior (HCC)    Psychotic affective disorder (HCC) 08/30/2015   History reviewed. No pertinent surgical history.  Family History:  Family History  Problem Relation Age of Onset   Healthy Mother     Social History:  Social History   Socioeconomic History   Marital status: Single    Spouse name: Not on file   Number of children: Not on file   Years of education: Not on file   Highest education level: Not on file   Occupational History   Not on file  Tobacco Use   Smoking status: Never   Smokeless tobacco: Never  Vaping Use   Vaping status: Every Day   Substances: Nicotine , Flavoring  Substance and Sexual Activity   Alcohol use: No   Drug use: Not Currently    Types: Marijuana   Sexual activity: Yes  Other Topics Concern   Not on file  Social History Narrative   Not on file   Social Drivers of Health   Financial Resource Strain: Low Risk  (09/18/2020)   Overall Financial Resource Strain (CARDIA)    Difficulty of Paying Living Expenses: Not very hard  Food Insecurity: No Food Insecurity (12/04/2023)   Hunger Vital Sign    Worried About Running Out of Food in the Last Year: Never true    Ran Out of Food in the Last Year: Never true  Transportation Needs: No Transportation Needs (12/04/2023)   PRAPARE - Administrator, Civil Service (Medical): No    Lack of Transportation (Non-Medical): No  Physical Activity: Sufficiently Active (09/18/2020)   Exercise Vital Sign    Days of Exercise per Week: 4 days    Minutes of Exercise per Session: 60 min  Stress: No Stress Concern Present (09/18/2020)   [employer] of Occupational Health - Occupational Stress Questionnaire    Feeling of Stress : Not at all  Social Connections: Unknown (01/04/2022)   [facility]   Social Network    Social Network: Not on file  Intimate Partner Violence: Not At Risk (12/04/2023)   Humiliation, Afraid, Rape, and Kick questionnaire    Fear of Current or Ex-Partner: No    Emotionally Abused: No    Physically Abused: No    Sexually Abused: No  Physical Findings: AIMS: 0  Musculoskeletal: Strength & Muscle Tone: within normal limits Gait & Station: normal Patient leans: N/A  Psychiatric Specialty Exam  Presentation  General Appearance: Appropriate for Environment; Casual  Eye Contact:Good  Speech:Clear and Coherent; Normal Rate  Speech  Volume:Normal   Mood and Affect  Mood:Euthymic  Affect:Appropriate; Congruent   Thought Process  Thought Processes:Coherent; Goal Directed; Linear  Descriptions of Associations:Intact  Orientation:Full (Time, Place and Person)  Thought Content:Logical  Hallucinations:Hallucinations: None  Ideas of Reference:None  Suicidal Thoughts:Suicidal Thoughts: No  Homicidal Thoughts:Homicidal Thoughts: No   Sensorium  Memory:Immediate Good; Recent Good; Remote Good  Judgment:Fair  Insight:Fair   Executive Functions  Concentration:Good  Attention Span:Good  Recall:Good  Fund of Knowledge:Good  Language:Good   Psychomotor Activity  Psychomotor Activity:Psychomotor Activity: Normal   Assets  Assets:Communication Skills; Desire for Improvement; Physical Health   Sleep  Sleep:Sleep: Good   Physical Exam: Physical Exam ROS Blood pressure (!) 141/87, pulse 88, temperature 98.5 F (36.9 C), temperature source Oral, resp. rate 18, height 5\' 9"  (1.753 m), weight 68.5 kg, SpO2 99%. Body mass index is 22.3 kg/m.  Social History   Tobacco Use  Smoking Status Never  Smokeless Tobacco Never   Tobacco Cessation:  N/A, patient does not currently use tobacco products  Blood Alcohol level:  Lab Results  Component Value Date   ETH <10 12/03/2023   ETH <10 02/22/2023    Metabolic Disorder Labs:  Lab Results  Component Value Date   HGBA1C 4.2 (L) 12/03/2023   MPG 73.84 12/03/2023   MPG 73.84 02/22/2023   Lab Results  Component Value Date   PROLACTIN 5.7 02/22/2023   PROLACTIN 11.2 04/07/2022   Lab Results  Component Value Date   CHOL 218 (H) 12/03/2023   TRIG 46 12/03/2023   HDL 75 12/03/2023   CHOLHDL 2.9 12/03/2023   VLDL 9 12/03/2023   LDLCALC 134 (H) 12/03/2023   LDLCALC 96 02/22/2023    See Psychiatric Specialty Exam and Suicide Risk Assessment completed by Attending Physician prior to discharge.  Discharge destination:  Home  Is patient  on multiple antipsychotic therapies at discharge:  No   Has Patient had three or more failed trials of antipsychotic monotherapy by history:  No  Recommended Plan for Multiple Antipsychotic Therapies: NA  Discharge Instructions     Diet - low sodium heart healthy   Complete by: As directed    Discharge instructions   Complete by: As directed    Take all medications as prescribed by his/her mental healthcare provider. Report any adverse effects and or reactions from the medicines to your outpatient provider promptly. Do not engage in alcohol and or illegal drug use while on prescription medicines. In the event of worsening symptoms, call the crisis hotline, 911 and or go to the nearest ED for appropriate evaluation and treatment of symptoms. follow-up with your primary care provider for your other medical issues, concerns and or health care needs.   Increase activity slowly   Complete by: As directed       Allergies as of 12/10/2023       Reactions   Haldol  [haloperidol ] Other (See Comments)   Severe dystonic reaction   Risperidone And Related Other (See Comments)   Severe dystonic reaction   Milk (cow) Other (See Comments)   GI Upset   Pork-derived Products Other (See Comments)   GI Upset        Medication List     STOP taking these medications  OLANZapine  5 MG tablet Commonly known as: ZYPREXA        TAKE these medications      Indication  ARIPiprazole  10 MG tablet Commonly known as: ABILIFY  Take 1 tablet (10 mg total) by mouth daily.  Indication: MIXED BIPOLAR AFFECTIVE DISORDER   ARIPiprazole  ER 400 MG Prsy prefilled syringe Commonly known as: ABILIFY  MAINTENA Inject 400 mg into the muscle every 28 (twenty-eight) days. Start taking on: Jan 05, 2024  Indication: Manic-Depression   divalproex  500 MG DR tablet Commonly known as: DEPAKOTE  Take 1 tablet (500 mg total) by mouth every 12 (twelve) hours.  Indication: MIXED BIPOLAR AFFECTIVE DISORDER    hydrOXYzine  25 MG tablet Commonly known as: ATARAX  Take 1 tablet (25 mg total) by mouth 3 (three) times daily as needed for anxiety.  Indication: Feeling Anxious   propranolol  10 MG tablet Commonly known as: INDERAL  Take 1 tablet (10 mg total) by mouth 2 (two) times daily.  Indication: High Blood Pressure, Anxiety Related to Current Life Problems        Follow-up Information     [facility] Go on 01/02/2024.   Why: You have an appointment for medication management services on 01/02/24 at 2:30 pm. The appointment will be held in person, but you may switch to Virtual. Contact information: [address] [place] [address] [phone]         [facility] Follow up on 12/13/2023.   Why: Please call this provider personally on 12/13/23 at 9:00 am, to schedule an appointment for therapy services as soon as possible. Contact information: [address] [place], [place] [address] [phone]                  Follow-up recommendations:   Activity:  as tolerated Diet:  heart healthy   Comments:  Prescriptions were given at discharge.  Patient is agreeable with the discharge plan.  Patient was given an opportunity to ask questions.  Patient appears to feel comfortable with discharge and denies any current suicidal or homicidal thoughts.    Patient is instructed prior to discharge to: Take all medications as prescribed by mental healthcare provider. Report any adverse effects and or reactions from the medicines to outpatient provider promptly. In the event of worsening symptoms, patient is instructed to call the crisis hotline, 911 and or go to the nearest ED for appropriate evaluation and treatment of symptoms. Patient is to follow-up with primary care provider for other medical issues, concerns and or health care needs.     Signed: Augusta Blizzard, MD 12/10/2023, 8:49 AM

## 2023-12-10 NOTE — Progress Notes (Signed)
  [facility] Adult Case Management Discharge Plan :  Will you be returning to the same living situation after discharge:  Yes,  pt is returning home at discharge At discharge, do you have transportation home?: Yes,  pt will be picked up by mother around 1030am Do you have the ability to pay for your medications: Yes,  pt has active health insurance coverage  Release of information consent forms completed and in the chart;  Patient's signature needed at discharge.  Patient to Follow up at:  Follow-up Information     [facility] Go on 01/02/2024.   Why: You have an appointment for medication management services on 01/02/24 at 2:30 pm. The appointment will be held in person, but you may switch to Virtual. Contact information: [address] [place] [address] [phone]         [facility] Follow up on 12/13/2023.   Why: Please call this provider personally on 12/13/23 at 9:00 am, to schedule an appointment for therapy services as soon as possible. Contact information: [address] Alana Hoyle [place], [place] [address] [phone]                 Next level of care provider has access to [facility] Link:no  Safety Planning and Suicide Prevention discussed: Yes,   Mom, Tevita Gomer [phone]     Has patient been referred to the Quitline?: Patient does not use tobacco/nicotine  products  Patient has been referred for addiction treatment: No known substance use disorder.  Vonzell Guerin, LCSWA 12/10/2023, 9:14 AM

## 2023-12-10 NOTE — Progress Notes (Signed)
 Adult Psychoeducational Group Note  Date:  12/10/2023 Time:  12:32 AM  Group Topic/Focus:  Wrap-Up Group:   The focus of this group is to help patients review their daily goal of treatment and discuss progress on daily workbooks.  Participation Level:  Active  Participation Quality:  Appropriate  Affect:  Appropriate  Cognitive:  Appropriate  Insight: Appropriate  Engagement in Group:  Engaged  Modes of Intervention:  Discussion  Additional Comments:  Pt stated he had a great day.  Pt goal was to talk to doctor and get a reevaluation. Pt met goal.  Aquilla Bayley 12/10/2023, 12:32 AM

## 2023-12-10 NOTE — Group Note (Signed)
  LCSW Group Therapy Note  Group Date: 12/10/2023  Start Time: 10:00am End Time: 11:10am  Group Therapy:   Boundaries  Participation Level:  Active  Description of Group: This group was used to explore boundary styles (porous, rigid, healthy), boundary types (physical, emotional, intellectual, sexual, material, time), basics about setting boundaries, possible phrases to use, and guidelines of how to approach someone by planning ahead, being respectful, using confident body language, and compromising.  Handouts were used to further reinforce teaching and to remind patients after hospitalization.  Patients identified current problems with setting boundaries in their own lives and discussed what they have tried to date.  Patients were encouraged to ask questions, express concerns, and bring up specific situations for group discussion.  Humor and role play were used to further healthy discussion on the topic.  Therapeutic Goals:  1.  Patient will identify one areas in their life where setting clear boundaries have been needed but unsuccessful to date.   2.  Patient will learn about boundary styles, boundary types, and setting boundaries. 3.  Patient will be provided with multiple illustrations and examples of healthy boundary setting and how to make corrections when boundaries are violated 4.  Patient will demonstrate ability to recognize the need for boundaries and make a plan on how to set boundaries in vignettes provided.  Summary of Patient Progress:   Patient demonstrated some insight into the subject matter of boundaries, was respectful of peers, and was attentive.  He shared that one difficulty with boundaries currently experienced is with his partner and to date he has tried talking to her with limited success.    Therapeutic Modalities:   Psychoeducation Solution-focused Processing   Ancel Kass, LCSW 12/10/2023  11:58 AM

## 2023-12-10 NOTE — Hospital Course (Signed)
 Reason for Admission: 28 year old African-American male with prior psychiatric diagnoses significant for schizoaffective disorder, bipolar-type, medication noncompliance, nicotine  abuse, and anxiety presents voluntarily to the [facility] for worsening symptoms of schizoaffective disorder, bipolar-type including AH of holy spirit telling him to harm self and others.  Hospital Course  During the patient's hospitalization, patient had extensive initial psychiatric evaluation, and follow-up psychiatric evaluations every day.  Psychiatric diagnoses provided upon initial assessment:  Schizoaffective Disorder, Bipolar Type  Patient's psychiatric medications were adjusted on admission:  -started Abilify  5 mg daily for psychosis -start Depakote  500 mg twice daily for mood stabilization -start trazodone  50 mg at bedtime for sleep -start propranolol  10 mg twice a day for HTN  During the hospitalization, other adjustments were made to the patient's psychiatric medication regimen:  -increased abilify  to 10 mg daily -transitioned to Abilify  Maintena 400 mg every 28 days on 12/09/23  Patient's care was discussed during the interdisciplinary team meeting every day during the hospitalization.  The patient denies having side effects to prescribed psychiatric medication.  Gradually, patient started adjusting to milieu. The patient was evaluated each day by a clinical provider to ascertain response to treatment. Improvement was noted by the patient's report of decreasing symptoms, improved sleep and appetite, affect, medication tolerance, behavior, and participation in unit programming.  Patient was asked each day to complete a self inventory noting mood, mental status, pain, new symptoms, anxiety and concerns.    Symptoms were reported as significantly decreased or resolved completely by discharge.   On day of discharge, the patient reports that their mood is stable. The patient denied having suicidal thoughts for  more than 48 hours prior to discharge.  Patient denies having homicidal thoughts.  Patient denies having auditory hallucinations.  Patient denies any visual hallucinations or other symptoms of psychosis. The patient was motivated to continue taking medication with a goal of continued improvement in mental health.   The patient reports their target psychiatric symptoms of psychosis and mania responded well to the psychiatric medications, and the patient reports overall benefit other psychiatric hospitalization. Supportive psychotherapy was provided to the patient. The patient also participated in regular group therapy while hospitalized. Coping skills, problem solving as well as relaxation therapies were also part of the unit programming.  Labs were reviewed with the patient, and abnormal results were discussed with the patient.  The patient is able to verbalize their individual safety plan to this provider.  # It is recommended to the patient to continue psychiatric medications as prescribed, after discharge from the hospital.    # It is recommended to the patient to follow up with your outpatient psychiatric provider and PCP.  # It was discussed with the patient, the impact of alcohol, drugs, tobacco have been there overall psychiatric and medical wellbeing, and total abstinence from substance use was recommended the patient.ed.  # Prescriptions provided or sent directly to preferred pharmacy at discharge. Patient agreeable to plan. Given opportunity to ask questions. Appears to feel comfortable with discharge.    # In the event of worsening symptoms, the patient is instructed to call the crisis hotline, 911 and or go to the nearest ED for appropriate evaluation and treatment of symptoms. To follow-up with primary care provider for other medical issues, concerns and or health care needs  # Patient was discharged home with a plan to follow up as noted below.

## 2023-12-10 NOTE — Plan of Care (Signed)
  Problem: Activity: Goal: Interest or engagement in activities will improve Outcome: Progressing   Problem: Activity: Goal: Sleeping patterns will improve Outcome: Progressing   

## 2023-12-10 NOTE — Plan of Care (Signed)
 Nurse discussed anxiety, depression and coping skills with patient.

## 2023-12-28 ENCOUNTER — Ambulatory Visit (HOSPITAL_COMMUNITY)
Admission: EM | Admit: 2023-12-28 | Discharge: 2023-12-29 | Disposition: A | Discharge status: Other Acute Inpatient Hospital | Attending: Psychiatry | Admitting: Psychiatry

## 2023-12-28 DIAGNOSIS — F250 Schizoaffective disorder, bipolar type: Secondary | ICD-10-CM | POA: Insufficient documentation

## 2023-12-28 DIAGNOSIS — F309 Manic episode, unspecified: Secondary | ICD-10-CM

## 2023-12-28 DIAGNOSIS — F419 Anxiety disorder, unspecified: Secondary | ICD-10-CM | POA: Insufficient documentation

## 2023-12-28 DIAGNOSIS — I10 Essential (primary) hypertension: Secondary | ICD-10-CM | POA: Insufficient documentation

## 2023-12-28 DIAGNOSIS — Z79899 Other long term (current) drug therapy: Secondary | ICD-10-CM | POA: Diagnosis not present

## 2023-12-28 LAB — LIPID PANEL
Cholesterol: 229 mg/dL — ABNORMAL HIGH (ref 0–200)
HDL: 76 mg/dL (ref >40)
LDL Cholesterol: 141 mg/dL — ABNORMAL HIGH (ref 0–99)
Total CHOL/HDL Ratio: 3 ratio
Triglycerides: 62 mg/dL (ref <150)
VLDL: 12 mg/dL (ref 0–40)

## 2023-12-28 LAB — URINALYSIS, ROUTINE W REFLEX MICROSCOPIC
Bilirubin Urine: NEGATIVE
Glucose, UA: NEGATIVE mg/dL
Hgb urine dipstick: NEGATIVE
Ketones, ur: 5 mg/dL — AB
Leukocytes,Ua: NEGATIVE
Nitrite: NEGATIVE
Protein, ur: NEGATIVE mg/dL
Specific Gravity, Urine: 1.026 (ref 1.005–1.030)
pH: 5 (ref 5.0–8.0)

## 2023-12-28 LAB — POCT URINE DRUG SCREEN - MANUAL ENTRY (I-SCREEN)
POC Amphetamine UR: NOT DETECTED
POC Buprenorphine (BUP): NOT DETECTED
POC Cocaine UR: NOT DETECTED
POC Marijuana UR: NOT DETECTED
POC Methadone UR: NOT DETECTED
POC Methamphetamine UR: NOT DETECTED
POC Morphine: NOT DETECTED
POC Oxazepam (BZO): NOT DETECTED
POC Oxycodone UR: NOT DETECTED
POC Secobarbital (BAR): NOT DETECTED

## 2023-12-28 LAB — CBC WITH DIFFERENTIAL/PLATELET
Abs Immature Granulocytes: 0.04 10*3/uL (ref 0.00–0.07)
Basophils Absolute: 0.1 10*3/uL (ref 0.0–0.1)
Basophils Relative: 1 %
Eosinophils Absolute: 0 10*3/uL (ref 0.0–0.5)
Eosinophils Relative: 0 %
HCT: 48 % (ref 39.0–52.0)
Hemoglobin: 15.5 g/dL (ref 13.0–17.0)
Immature Granulocytes: 0 %
Lymphocytes Relative: 27 %
Lymphs Abs: 2.8 10*3/uL (ref 0.7–4.0)
MCH: 29 pg (ref 26.0–34.0)
MCHC: 32.3 g/dL (ref 30.0–36.0)
MCV: 89.7 fL (ref 80.0–100.0)
Monocytes Absolute: 1.1 10*3/uL — ABNORMAL HIGH (ref 0.1–1.0)
Monocytes Relative: 11 %
Neutro Abs: 6.4 10*3/uL (ref 1.7–7.7)
Neutrophils Relative %: 61 %
Platelets: 218 10*3/uL (ref 150–400)
RBC: 5.35 MIL/uL (ref 4.22–5.81)
RDW: 12.9 % (ref 11.5–15.5)
WBC: 10.4 10*3/uL (ref 4.0–10.5)
nRBC: 0 % (ref 0.0–0.2)

## 2023-12-28 LAB — COMPREHENSIVE METABOLIC PANEL WITH GFR
ALT: 8 U/L (ref 0–44)
AST: 20 U/L (ref 15–41)
Albumin: 4.4 g/dL (ref 3.5–5.0)
Alkaline Phosphatase: 48 U/L (ref 38–126)
Anion gap: 13 (ref 5–15)
BUN: 19 mg/dL (ref 6–20)
CO2: 26 mmol/L (ref 22–32)
Calcium: 10 mg/dL (ref 8.9–10.3)
Chloride: 102 mmol/L (ref 98–111)
Creatinine, Ser: 1.51 mg/dL — ABNORMAL HIGH (ref 0.61–1.24)
GFR, Estimated: >60 mL/min (ref >60)
Glucose, Bld: 71 mg/dL (ref 70–99)
Potassium: 4.3 mmol/L (ref 3.5–5.1)
Sodium: 141 mmol/L (ref 135–145)
Total Bilirubin: 0.6 mg/dL (ref 0.0–1.2)
Total Protein: 7.2 g/dL (ref 6.5–8.1)

## 2023-12-28 LAB — MAGNESIUM: Magnesium: 2 mg/dL (ref 1.7–2.4)

## 2023-12-28 LAB — TSH: TSH: 1.945 u[IU]/mL (ref 0.350–4.500)

## 2023-12-28 LAB — HEMOGLOBIN A1C
Hgb A1c MFr Bld: 4 % — ABNORMAL LOW (ref 4.8–5.6)
Mean Plasma Glucose: 68.1 mg/dL

## 2023-12-28 LAB — ETHANOL: Alcohol, Ethyl (B): <15 mg/dL (ref <15)

## 2023-12-28 LAB — VALPROIC ACID LEVEL: Valproic Acid Lvl: 92 ug/mL (ref 50–100)

## 2023-12-28 LAB — HIV ANTIBODY (ROUTINE TESTING W REFLEX): HIV Screen 4th Generation wRfx: NONREACTIVE

## 2023-12-28 MED ORDER — OLANZAPINE 5 MG PO TBDP: 5.0000 mg | ORAL_TABLET | Freq: Three times a day (TID) | ORAL | Status: DC | PRN

## 2023-12-28 MED ORDER — DIVALPROEX SODIUM 500 MG PO DR TAB
500.0000 mg | DELAYED_RELEASE_TABLET | Freq: Two times a day (BID) | ORAL | Status: DC
  Administered 2023-12-28 – 2023-12-29 (×2): 500 mg via ORAL
  Filled 2023-12-28 (×2): qty 1

## 2023-12-28 MED ORDER — MAGNESIUM HYDROXIDE 400 MG/5ML PO SUSP: 30.0000 mL | Freq: Every day | ORAL | Status: DC | PRN

## 2023-12-28 MED ORDER — ARIPIPRAZOLE 10 MG PO TABS
10.0000 mg | ORAL_TABLET | Freq: Every day | ORAL | Status: DC
Start: 2023-12-29 — End: 2023-12-29
  Administered 2023-12-29: 10 mg via ORAL
  Filled 2023-12-28: qty 1

## 2023-12-28 MED ORDER — TRAZODONE HCL 50 MG PO TABS
50.0000 mg | ORAL_TABLET | Freq: Every evening | ORAL | Status: DC | PRN
  Administered 2023-12-28: 50 mg via ORAL
  Filled 2023-12-28: qty 1

## 2023-12-28 MED ORDER — HYDROXYZINE HCL 25 MG PO TABS: 25.0000 mg | ORAL_TABLET | Freq: Three times a day (TID) | ORAL | Status: DC | PRN

## 2023-12-28 MED ORDER — HYDROXYZINE HCL 25 MG PO TABS
25.0000 mg | ORAL_TABLET | Freq: Three times a day (TID) | ORAL | Status: DC | PRN
  Administered 2023-12-28 – 2023-12-29 (×2): 25 mg via ORAL
  Filled 2023-12-28 (×2): qty 1

## 2023-12-28 MED ORDER — ALUM & MAG HYDROXIDE-SIMETH 200-200-20 MG/5ML PO SUSP: 30.0000 mL | ORAL | Status: DC | PRN

## 2023-12-28 MED ORDER — ACETAMINOPHEN 325 MG PO TABS: 650.0000 mg | ORAL_TABLET | Freq: Four times a day (QID) | ORAL | Status: DC | PRN

## 2023-12-28 MED ORDER — OLANZAPINE 10 MG PO TBDP
10.0000 mg | ORAL_TABLET | Freq: Every day | ORAL | Status: DC
  Administered 2023-12-28: 10 mg via ORAL
  Filled 2023-12-28: qty 1

## 2023-12-28 MED ORDER — OLANZAPINE 10 MG IM SOLR: 5.0000 mg | Freq: Three times a day (TID) | INTRAMUSCULAR | Status: DC | PRN

## 2023-12-28 MED ORDER — OLANZAPINE 10 MG IM SOLR
10.0000 mg | Freq: Three times a day (TID) | INTRAMUSCULAR | Status: DC | PRN
Start: 2023-12-28 — End: 2023-12-29
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
## 2023-12-28 NOTE — ED Notes (Signed)
 Patient is A&O X 4. He denies SI/HI or VH. He denies physical pain or discomfort. Skin check conducted by this nurse and Val, MHT. Patient's speech is rapid. Thought process is disorganized. He asks about promotions for staff. While conducting the skin check pt began to dance. Pt oriented to the unit and provided meal and beverage. He denies additional needs at this time. We will continue to monitor for safety.

## 2023-12-28 NOTE — Progress Notes (Signed)
 Patient has been denied by [facility] due to no appropriate beds available. Patient meets BH inpatient criteria per Elston Halsted, NP. Patient has been faxed out to the following facilities:   [facility] [address] [place] [place] [address] [phone]  [facility] [address] [place] [address] [phone]  [facility] [address] [place] [address] [phone]  [facility] [address] [place], [place] [address] [phone]  [facility] [address] [place] [address] [phone]  [facility] [address], [place] [address] [phone]  [facility] [address] [place] [address] [phone]  [facility] EFAX [address] [place] [phone]  [facility] [address], [place] [address] [phone]  [facility] [address] [place], [place] [address] [phone]  [facility] [address] [place] [address] [phone]  [facility] [address] [place] [address] [phone]  [facility] [address] [place] [address] [phone]  [facility] [address] [place] [address] [phone]  [facility] [address], [place] [address] [phone]  [facility] [address], [place] [address] [phone]  [facility] Patient Placement [facility], [place] [phone]  [phone]   Phares Brasher, MSW, LCSW-A  5:41 PM 12/28/2023
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
## 2023-12-28 NOTE — ED Provider Notes (Signed)
 [facility] Continuous Assessment Admission H&P  Date: 12/28/23 Patient Name: Nathan Holloway MRN: [id number] Chief Complaint: "I am here for reevaluation"  Diagnoses:  Final diagnoses:  Schizoaffective disorder, bipolar type (HCC)  Mania (HCC)    HPI: Nathan Holloway is a 28 year-old-male who presents to [facility] voluntarily, accompanied by his mother Starlin Bick [phone]. He presents with increased manic behaviors including intrusiveness, talkativeness, anxiety, and disorganized thinking.  Patient's mother reports that he has been diagnosed with Schizoaffective Disorder, Bipolar type. His current medications include Abilify , Depakote  and Hydroxyzine . This episode was precipitated by a break up with his girlfriend. Prior to  having the presenting symptoms, patient was dating a girl who would stress him out with long, nocturnal phone conversations and patient would look overwhelmed. Patient also was experiencing difficulty completing his work assignments and his boss was sending him frequent reminders about it.  7 months ago, patient had a shoulder injury and was treated with a steroid shot and patient mood has changed since.  Patient was admitted here last month and was stabilized  and medications were adjusted as follow:  -started Abilify  5 mg daily for psychosis -start Depakote  500 mg twice daily for mood stabilization -start trazodone  50 mg at bedtime for sleep -start propranolol  10 mg twice a day for HTN  He reports that he currently does not take propranolol  but takes the rest of his medications. Mother states there has been some inconsistencies with medication schedule but he takes his medications most of the time. His mother reports that he becomes overwhelmed each time he starts a relationship and it would be best for him to stabilize first before he gets into another relationship.   Assessment: Patient is evaluated face-to-face and he allows his mother to participate.  He is alert and  oriented x 4  but displaying bizarre behaviors such as grimacing, dancing, and staring. He is intrusive and flirting.  He appears healthy and well nourished. He is appropriately dressed and groomed and has good hygiene.  Patient states "I think I am going to remain single from now, I am in celibacy".  When asked about hx of trauma, patient states "to tell you the truth, I will never be truly happy, and my mother knows". [Patient's mother reports that his father died when patient was 79 years-old and that is the only trauma he has been carrying].  Patient's mother has had other male friends and were supportive to the family.  She shares that pt responds well to Zyprexa  whenever he is in crisis.   Patient admits to having a mental breakdown and  needs help with stabilization. He denies SI/HI/AVH but appears to be overwhelmed. During this evaluation, patient stands behind his mother and starts displaying bizarre behaviors, dancing, grimacing and laughing inappropriately.  Patient and mother agree to inpatient admission for stabilization.      Total Time spent with patient: 1 hour  Musculoskeletal  Strength & Muscle Tone: within normal limits Gait & Station: normal Patient leans: N/A  Psychiatric Specialty Exam  Presentation General Appearance:  Appropriate for Environment  Eye Contact: Good  Speech: Other (comment) (rapid)  Speech Volume: Normal  Handedness: Right   Mood and Affect  Mood: Anxious; Labile  Affect: Labile   Thought Process  Thought Processes: Irrevelant; Disorganized  Descriptions of Associations:Tangential  Orientation:Full (Time, Place and Person)  Thought Content:Paranoid Ideation; Obsessions; Tangential  Diagnosis of Schizophrenia or Schizoaffective disorder in past: Yes  Duration of Psychotic Symptoms: Greater than six months  Hallucinations:Hallucinations: None  Ideas of Reference:Delusions  Suicidal Thoughts:Suicidal Thoughts: No  Homicidal  Thoughts:Homicidal Thoughts: No   Sensorium  Memory: Immediate Fair; Recent Fair; Remote Fair  Judgment: Fair  Insight: Fair   Art therapist  Concentration: Fair  Attention Span: Fair  Recall: [employer] of Knowledge: Fair  Language: Fair   Psychomotor Activity  Psychomotor Activity: Psychomotor Activity: Restlessness   Assets  Assets: Manufacturing systems engineer; Desire for Improvement; Social Support; Resilience; Physical Health; Vocational/Educational; Financial Resources/Insurance   Sleep  Sleep: Sleep: Poor   Nutritional Assessment (For OBS and FBC admissions only) Has the patient had a weight loss or gain of 10 pounds or more in the last 3 months?: No Has the patient had a decrease in food intake/or appetite?: No Does the patient have dental problems?: No Does the patient have eating habits or behaviors that may be indicators of an eating disorder including binging or inducing vomiting?: No Has the patient recently lost weight without trying?: 0 Has the patient been eating poorly because of a decreased appetite?: 0 Malnutrition Screening Tool Score: 0    Physical Exam Vitals and nursing note reviewed.  Constitutional:      Appearance: Normal appearance. He is normal weight.  HENT:     Head: Normocephalic and atraumatic.     Right Ear: Tympanic membrane normal.     Left Ear: Tympanic membrane normal.     Nose: Nose normal.     Mouth/Throat:     Mouth: Mucous membranes are moist.  Eyes:     Extraocular Movements: Extraocular movements intact.     Pupils: Pupils are equal, round, and reactive to light.  Cardiovascular:     Rate and Rhythm: Normal rate.     Pulses: Normal pulses.  Pulmonary:     Effort: Pulmonary effort is normal.  Musculoskeletal:        General: Normal range of motion.     Cervical back: Normal range of motion and neck supple.  Neurological:     General: No focal deficit present.     Mental Status: He is alert and  oriented to person, place, and time.    Review of Systems  Constitutional: Negative.   HENT: Negative.    Eyes: Negative.   Respiratory: Negative.    Cardiovascular: Negative.   Gastrointestinal: Negative.   Genitourinary: Negative.   Musculoskeletal: Negative.   Skin: Negative.   Neurological: Negative.   Endo/Heme/Allergies: Negative.   Psychiatric/Behavioral:  The patient is nervous/anxious.     Blood pressure (!) 142/93, pulse 85, temperature 98.3 F (36.8 C), temperature source Oral, resp. rate 17, SpO2 100%. There is no height or weight on file to calculate BMI.  Past Psychiatric History: Schizoaffective disorder, Bipolar type   Is the patient at risk to self? No  Has the patient been a risk to self in the past 6 months? No .    Has the patient been a risk to self within the distant past? No   Is the patient a risk to others? No   Has the patient been a risk to others in the past 6 months? No   Has the patient been a risk to others within the distant past? No   Past Medical History: NA  Family History: NA  Social History: Lives   Last Labs:  Admission on 12/04/2023, Discharged on 12/10/2023  Component Date Value Ref Range Status   Vit D, 25-Hydroxy 12/05/2023 42.52  30 - 100 ng/mL Final   Comment: (  NOTE) Vitamin D  deficiency has been defined by the Institute of Medicine  and an Endocrine Society practice guideline as a level of serum 25-OH  vitamin D  less than 20 ng/mL (1,2). The Endocrine Society went on to  further define vitamin D  insufficiency as a level between 21 and 29  ng/mL (2).  1. IOM (Institute of Medicine). 2010. Dietary reference intakes for  calcium and D. [place]: [employer]. 2. Holick MF, Binkley Mango, Bischoff-Ferrari HA, et al. Evaluation,  treatment, and prevention of vitamin D  deficiency: an Endocrine  Society clinical practice guideline, JCEM. 2011 Jul; 96(7): 1911-30.  Performed at [facility], [address], [place], [place] [address]    Vitamin B-12 12/05/2023 486  180 - 914 pg/mL Final   Comment: (NOTE) This assay is not validated for testing neonatal or myeloproliferative syndrome specimens for Vitamin B12 levels. Performed at [facility], [address] [place], [place] [address]    Valproic  Acid Lvl 12/09/2023 86  50.0 - 100.0 ug/mL Final   Performed at [facility], [address], [place], [place] [address]  Admission on 12/03/2023, Discharged on 12/04/2023  Component Date Value Ref Range Status   WBC 12/03/2023 9.7  4.0 - 10.5 K/uL Final   RBC 12/03/2023 5.64  4.22 - 5.81 MIL/uL Final   Hemoglobin 12/03/2023 16.3  13.0 - 17.0 g/dL Final   HCT 91/47/8295 48.3  39.0 - 52.0 % Final   MCV 12/03/2023 85.6  80.0 - 100.0 fL Final   MCH 12/03/2023 28.9  26.0 - 34.0 pg Final   MCHC 12/03/2023 33.7  30.0 - 36.0 g/dL Final   RDW 62/13/0865 12.0  11.5 - 15.5 % Final   Platelets 12/03/2023 200  150 - 400 K/uL Final   nRBC 12/03/2023 0.0  0.0 - 0.2 % Final   Neutrophils Relative % 12/03/2023 68  % Final   Neutro Abs 12/03/2023 6.6  1.7 - 7.7 K/uL Final   Lymphocytes Relative 12/03/2023 21  % Final   Lymphs Abs 12/03/2023 2.0  0.7 - 4.0 K/uL Final   Monocytes Relative 12/03/2023 10  % Final   Monocytes Absolute 12/03/2023 1.0  0.1 - 1.0 K/uL Final   Eosinophils Relative 12/03/2023 0  % Final   Eosinophils Absolute 12/03/2023 0.0  0.0 - 0.5 K/uL Final   Basophils Relative 12/03/2023 1  % Final   Basophils Absolute 12/03/2023 0.1  0.0 - 0.1 K/uL Final   Immature Granulocytes 12/03/2023 0  % Final   Abs Immature Granulocytes 12/03/2023 0.03  0.00 - 0.07 K/uL Final   Performed at [facility], [address], [place], [place] [address]   Sodium 12/03/2023 139  135 - 145 mmol/L Final   Potassium 12/03/2023 4.0  3.5 - 5.1 mmol/L Final   Chloride 12/03/2023 101  98 - 111 mmol/L Final   CO2 12/03/2023 25  22 - 32 mmol/L Final   Glucose, Bld  12/03/2023 91  70 - 99 mg/dL Final   Glucose reference range applies only to samples taken after fasting for at least 8 hours.   BUN 12/03/2023 14  6 - 20 mg/dL Final   Creatinine, Ser 12/03/2023 1.51 (H)  0.61 - 1.24 mg/dL Final   Calcium 62/95/2841 10.5 (H)  8.9 - 10.3 mg/dL Final   Total Protein 32/44/0102 8.9 (H)  6.5 - 8.1 g/dL Final   Albumin 72/53/6644 5.1 (H)  3.5 - 5.0 g/dL Final  AST 12/03/2023 27  15 - 41 U/L Final   ALT 12/03/2023 16  0 - 44 U/L Final   Alkaline Phosphatase 12/03/2023 59  38 - 126 U/L Final   Total Bilirubin 12/03/2023 1.2  0.0 - 1.2 mg/dL Final   GFR, Estimated 12/03/2023 >60  >60 mL/min Final   Comment: (NOTE) Calculated using the CKD-EPI Creatinine Equation (2021)    Anion gap 12/03/2023 13  5 - 15 Final   Performed at [facility], [address] [place], [place] [address]   Hgb A1c MFr Bld 12/03/2023 4.2 (L)  4.8 - 5.6 % Final   Comment: (NOTE) Pre diabetes:          5.7%-6.4%  Diabetes:              >6.4%  Glycemic control for   <7.0% adults with diabetes    Mean Plasma Glucose 12/03/2023 73.84  mg/dL Final   Performed at [facility], [address] [place], [place] [address]   Magnesium  12/03/2023 1.9  1.7 - 2.4 mg/dL Final   Performed at [facility], [address], [place], [place] [address]   Alcohol, Ethyl (B) 12/03/2023 <10  <10 mg/dL Final   Comment: (NOTE) Lowest detectable limit for serum alcohol is 10 mg/dL.  For medical purposes only. Performed at [facility], [address], [place], [place] [address]    Cholesterol 12/03/2023 218 (H)  0 - 200 mg/dL Final   Triglycerides 65/78/4696 46  <150 mg/dL Final   HDL 29/52/8413 75  >40 mg/dL Final   Total CHOL/HDL Ratio 12/03/2023 2.9  RATIO Final   VLDL 12/03/2023 9  0 - 40 mg/dL Final   LDL Cholesterol 12/03/2023 134 (H)  0 - 99 mg/dL Final   Comment:        Total Cholesterol/HDL:CHD Risk Coronary Heart Disease Risk Table                     Men    Women  1/2 Average Risk   3.4   3.3  Average Risk       5.0   4.4  2 X Average Risk   9.6   7.1  3 X Average Risk  23.4   11.0        Use the calculated Patient Ratio above and the CHD Risk Table to determine the patient's CHD Risk.        ATP III CLASSIFICATION (LDL):  <100     mg/dL   Optimal  244-010  mg/dL   Near or Above                    Optimal  130-159  mg/dL   Borderline  272-536  mg/dL   High  >644     mg/dL   Very High Performed at [facility], [address] [place], [place] [address]    TSH 12/03/2023 3.278  0.350 - 4.500 uIU/mL Final   Comment: Performed by a 3rd Generation assay with a functional sensitivity of <=0.01 uIU/mL. Performed at [facility], [address] [place], [place] [address]    POC Amphetamine UR 12/04/2023 None Detected  NONE DETECTED (Cut Off Level 1000 ng/mL) Final   POC Secobarbital (BAR) 12/04/2023 None Detected  NONE DETECTED (Cut Off Level 300 ng/mL) Final   POC Buprenorphine (BUP) 12/04/2023 None Detected  NONE DETECTED (Cut Off Level 10 ng/mL) Final  POC Oxazepam (BZO) 12/04/2023 Positive (A)  NONE DETECTED (Cut Off Level 300 ng/mL) Final   POC Cocaine UR 12/04/2023 None Detected  NONE DETECTED (Cut Off Level 300 ng/mL) Final   POC Methamphetamine UR 12/04/2023 None Detected  NONE DETECTED (Cut Off Level 1000 ng/mL) Final   POC Morphine 12/04/2023 None Detected  NONE DETECTED (Cut Off Level 300 ng/mL) Final   POC Methadone UR 12/04/2023 None Detected  NONE DETECTED (Cut Off Level 300 ng/mL) Final   POC Oxycodone UR 12/04/2023 None Detected  NONE DETECTED (Cut Off Level 100 ng/mL) Final   POC Marijuana UR 12/04/2023 None Detected  NONE DETECTED (Cut Off Level 50 ng/mL) Final    Allergies: Haldol  [haloperidol ], Risperidone and related, Milk (cow), and Pork-derived products  Medications:  PTA Medications  Medication Sig   propranolol  (INDERAL ) 10 MG tablet Take 1 tablet (10 mg total) by mouth 2 (two) times daily.    ARIPiprazole  (ABILIFY ) 10 MG tablet Take 1 tablet (10 mg total) by mouth daily.   [START ON 01/05/2024] ARIPiprazole  ER (ABILIFY  MAINTENA) 400 MG PRSY prefilled syringe Inject 400 mg into the muscle every 28 (twenty-eight) days.   hydrOXYzine  (ATARAX ) 25 MG tablet Take 1 tablet (25 mg total) by mouth 3 (three) times daily as needed for anxiety.   divalproex  (DEPAKOTE ) 500 MG DR tablet Take 1 tablet (500 mg total) by mouth every 12 (twelve) hours.      Medical Decision Making  Admit to observation unit. Inpatient treatment recommended.  Medications: Acetaminophen  650 mg PO Q 6PRN  Maalox 30 ml  PO Q 4  Milk of Magnesia 30 ml PO daily PRN Olanzapine  Zydis 10 mg PO once    Labs: Valproic  Acid level, CBC, CMP, RPR, A1C, TSH, Ethanol, Magnesium , Lipid panel, Hepatic function panel, UA, UDS  EKG  Agitation protocol     Recommendations  Based on my evaluation the patient does not appear to have an emergency medical condition.  Elston Halsted, NP 12/28/23  12:16 PM

## 2023-12-28 NOTE — Progress Notes (Signed)
 [facility] LCSW Progress Note   12/28/2023    5:54 PM  [name]   [phone]   Type of Contact and Topic:  Psychiatric Bed Placement   Pt accepted to [facility]ay    Patient meets inpatient criteria per Elston Halsted, NP  The attending provider will be Dr. Berl Breed  Call report to [phone]  Keri Peat, RN @ [facility] notified.     Pt scheduled  to arrive at [facility] TOMORROW (5/9) AFTER 0900.    Darsh Vandevoort, MSW, LCSW-Holloway  5:56 PM 12/28/2023

## 2023-12-29 DIAGNOSIS — F250 Schizoaffective disorder, bipolar type: Secondary | ICD-10-CM | POA: Diagnosis not present

## 2023-12-29 DIAGNOSIS — F309 Manic episode, unspecified: Secondary | ICD-10-CM

## 2023-12-29 LAB — RPR: RPR Ser Ql: NONREACTIVE

## 2023-12-29 NOTE — ED Notes (Signed)
 Pt was provided breakfast.

## 2023-12-29 NOTE — ED Provider Notes (Signed)
 [facility]BC/OBS ASAP Discharge Summary  Date and Time: 12/29/2023 9:17 AM  Name: Nathan Holloway  MRN:  [id number]   Discharge Diagnoses:  Final diagnoses:  Schizoaffective disorder, bipolar type (HCC)  Mania (HCC)    Subjective:  Nathan Holloway is a 28 year-old-male who presented  to [facility] voluntarily, accompanied by his mother Elieser Garvis [phone]. He presented with increased manic behaviors including intrusiveness, talkativeness, anxiety, and disorganized thinking.  Patient's mother reported that he has been diagnosed with Schizoaffective Disorder, Bipolar type. His current medications include Abilify , Depakote  and Hydroxyzine . This episode was precipitated by a break up with his girlfriend. Prior to  having the presenting symptoms, patient was dating a girl who would stress him out with long, nocturnal phone conversations and patient would look overwhelmed. Patient also was experiencing difficulty completing his work assignments and his boss was sending him frequent reminders about it.  7 months ago, patient had a shoulder injury and was treated with a steroid shot and patient mood has changed since.  Patient was admitted here last month and was stabilized  and medications were adjusted as follow:  -started Abilify  5 mg daily for psychosis -start Depakote  500 mg twice daily for mood stabilization -start trazodone  50 mg at bedtime for sleep -start propranolol  10 mg twice a day for HTN   He reported that he currently does not take propranolol  but takes the rest of his medications. Mother stated there has been some inconsistencies with medication schedule but he takes his medications most of the time. His mother reported that he becomes overwhelmed each time he starts a relationship and it would be best for him to stabilize first before he gets into another relationship.   Stay Summary: patient was evaluated and admitted to going through a mental breakdown and needs help with stabilization. UDS negative.  He presented with no SI/HI. He presented with bizarre/manic behaviors including intrusiveness, talkativeness, anxiety, and disorganized thinking. Face-to-face assessment states that "When asked about hx of trauma, patient states "to tell you the truth, I will never be truly happy, and my mother knows". [Patient's mother reports that his father died when patient was 72 years-old and that is the only trauma he has been carrying]. Patient's mother has had other male friends and were supportive to the family. She shares that pt responds well to Zyprexa  whenever he is in crisis".   Per nursing, patient displayed sexual behaviors toward staff and peers and was frequently redirected.   Mother reported that patient has not been sleeping well, he would spend a long time over the phone with girlfriend, then would become very upset and overwhelmed. Patient is having difficulty completing work tasks and endorses delusional thinking.   Arno was admitted to the observation unit for overnight monitoring and the following medications were restarted upon lab results:  Abilify  10 mg PO daily Depakote  500 mg PO BID Trazodone  50 mg PO HS PRN Patient received Zyprexa  ODT 10 mg once and showed improvement as evidenced by his ability to get some sleep last night.   Inpatient was recommended for stabilization.     Total Time spent with patient: 45 minutes  Past Psychiatric History: Schizoaffective Disorder, Bipolar type Past Medical History: NA Family History: NA Family Psychiatric History: NA Social History: Lives with his mother and a sibling. Employed. Father died when pt was 23 year-old Tobacco Cessation:  N/A, patient does not currently use tobacco products  Current Medications:  Current Facility-Administered Medications  Medication Dose Route Frequency Provider Last Rate  Last Admin   acetaminophen  (TYLENOL ) tablet 650 mg  650 mg Oral Q6H PRN Gilman Lade, NP       alum & mag hydroxide-simeth  (MAALOX/MYLANTA) 200-200-20 MG/5ML suspension 30 mL  30 mL Oral Q4H PRN Lorrene Rosser, Lunah Losasso M, NP       ARIPiprazole  (ABILIFY ) tablet 10 mg  10 mg Oral Daily Romon Devereux M, NP   10 mg at 12/29/23 0825   divalproex  (DEPAKOTE ) DR tablet 500 mg  500 mg Oral BID Gilman Lade, NP   500 mg at 12/29/23 0825   hydrOXYzine  (ATARAX ) tablet 25 mg  25 mg Oral TID PRN Gilman Lade, NP   25 mg at 12/29/23 6433   magnesium  hydroxide (MILK OF MAGNESIA) suspension 30 mL  30 mL Oral Daily PRN Gilman Lade, NP       OLANZapine  (ZYPREXA ) injection 10 mg  10 mg Intramuscular TID PRN Gilman Lade, NP       OLANZapine  (ZYPREXA ) injection 5 mg  5 mg Intramuscular TID PRN Gilman Lade, NP       OLANZapine  zydis (ZYPREXA ) disintegrating tablet 10 mg  10 mg Oral QHS Lorrene Rosser, Rudean Icenhour M, NP   10 mg at 12/28/23 2114   OLANZapine  zydis (ZYPREXA ) disintegrating tablet 5 mg  5 mg Oral TID PRN Komal Stangelo M, NP       traZODone  (DESYREL ) tablet 50 mg  50 mg Oral QHS PRN Decorey Wahlert M, NP   50 mg at 12/28/23 2113   Current Outpatient Medications  Medication Sig Dispense Refill   [START ON 01/05/2024] ARIPiprazole  ER (ABILIFY  MAINTENA) 400 MG PRSY prefilled syringe Inject 400 mg into the muscle every 28 (twenty-eight) days. 1 each 0   divalproex  (DEPAKOTE  SPRINKLE) 125 MG capsule Take 500 mg by mouth 2 (two) times daily.     ARIPiprazole  (ABILIFY ) 10 MG tablet Take 1 tablet (10 mg total) by mouth daily. (Patient not taking: Reported on 12/28/2023) 14 tablet 0   hydrOXYzine  (ATARAX ) 25 MG tablet Take 1 tablet (25 mg total) by mouth 3 (three) times daily as needed for anxiety. (Patient not taking: Reported on 12/28/2023) 60 tablet 0   propranolol  (INDERAL ) 10 MG tablet Take 1 tablet (10 mg total) by mouth 2 (two) times daily. (Patient not taking: Reported on 12/28/2023) 60 tablet 0    PTA Medications:  Facility Ordered Medications  Medication   acetaminophen  (TYLENOL )  tablet 650 mg   alum & mag hydroxide-simeth (MAALOX/MYLANTA) 200-200-20 MG/5ML suspension 30 mL   magnesium  hydroxide (MILK OF MAGNESIA) suspension 30 mL   OLANZapine  zydis (ZYPREXA ) disintegrating tablet 5 mg   OLANZapine  (ZYPREXA ) injection 5 mg   OLANZapine  (ZYPREXA ) injection 10 mg   traZODone  (DESYREL ) tablet 50 mg   OLANZapine  zydis (ZYPREXA ) disintegrating tablet 10 mg   divalproex  (DEPAKOTE ) DR tablet 500 mg   ARIPiprazole  (ABILIFY ) tablet 10 mg   hydrOXYzine  (ATARAX ) tablet 25 mg   PTA Medications  Medication Sig   [START ON 01/05/2024] ARIPiprazole  ER (ABILIFY  MAINTENA) 400 MG PRSY prefilled syringe Inject 400 mg into the muscle every 28 (twenty-eight) days.   divalproex  (DEPAKOTE  SPRINKLE) 125 MG capsule Take 500 mg by mouth 2 (two) times daily.   propranolol  (INDERAL ) 10 MG tablet Take 1 tablet (10 mg total) by mouth 2 (two) times daily. (Patient not taking: Reported on 12/28/2023)   ARIPiprazole  (ABILIFY ) 10 MG tablet Take 1 tablet (10 mg total) by mouth daily. (Patient not taking: Reported on 12/28/2023)   hydrOXYzine  (  ATARAX ) 25 MG tablet Take 1 tablet (25 mg total) by mouth 3 (three) times daily as needed for anxiety. (Patient not taking: Reported on 12/28/2023)       12/29/2023    9:16 AM 12/28/2023   12:15 PM 05/17/2022    1:00 PM  Depression screen PHQ 2/9  Decreased Interest 1 1 1   Down, Depressed, Hopeless 1 1 0  PHQ - 2 Score 2 2 1   Altered sleeping 2 2 3   Tired, decreased energy 2 2 3   Change in appetite 0 0 3  Feeling bad or failure about yourself  0 0 1  Trouble concentrating 2 2 1   Moving slowly or fidgety/restless 0  0  Suicidal thoughts 0 0 0  PHQ-9 Score 8 8 12   Difficult doing work/chores Very difficult Very difficult Somewhat difficult    Flowsheet Row ED from 12/28/2023 in [facility] (Discharged) [facility] from 12/03/2023 in [facility]  C-SSRS RISK CATEGORY No Risk High Risk High Risk       Musculoskeletal  Strength & Muscle Tone: within normal limits Gait & Station: normal Patient leans: N/A  Psychiatric Specialty Exam  Presentation  General Appearance:  Appropriate for Environment  Eye Contact: Good  Speech: Normal Rate  Speech Volume: Normal  Handedness: Right   Mood and Affect  Mood: Anxious; Labile  Affect: Labile   Thought Process  Thought Processes: Disorganized  Descriptions of Associations:Tangential  Orientation:Full (Time, Place and Person)  Thought Content:Paranoid Ideation; Obsessions; Tangential  Diagnosis of Schizophrenia or Schizoaffective disorder in past: Yes  Duration of Psychotic Symptoms: Greater than six months   Hallucinations:Hallucinations: None  Ideas of Reference:Delusions  Suicidal Thoughts:Suicidal Thoughts: No  Homicidal Thoughts:Homicidal Thoughts: No   Sensorium  Memory: Immediate Fair; Recent Fair; Remote Fair  Judgment: Fair  Insight: Fair   Art therapist  Concentration: Fair  Attention Span: Fair  Recall: [employer] of Knowledge: Fair  Language: Fair   Psychomotor Activity  Psychomotor Activity: Psychomotor Activity: Restlessness   Assets  Assets: Communication Skills; Desire for Improvement; Physical Health; Social Support; Health and safety inspector   Sleep  Sleep: Sleep: Fair   Nutritional Assessment (For OBS and FBC admissions only) Has the patient had a weight loss or gain of 10 pounds or more in the last 3 months?: No Has the patient had a decrease in food intake/or appetite?: No Does the patient have dental problems?: No Does the patient have eating habits or behaviors that may be indicators of an eating disorder including binging or inducing vomiting?: No Has the patient recently lost weight without trying?: 0 Has the patient been eating poorly because of a decreased appetite?:  0 Malnutrition Screening Tool Score: 0    Physical Exam  Physical Exam Vitals and nursing note reviewed.  Constitutional:      Appearance: Normal appearance. He is normal weight.  HENT:     Head: Normocephalic and atraumatic.     Right Ear: Tympanic membrane normal.     Left Ear: Tympanic membrane normal.     Nose: Nose normal.     Mouth/Throat:     Mouth: Mucous membranes are moist.  Eyes:     Extraocular Movements: Extraocular movements intact.     Pupils: Pupils are equal, round, and reactive to light.  Cardiovascular:     Rate and Rhythm: Normal rate.     Pulses: Normal pulses.  Pulmonary:  Effort: Pulmonary effort is normal.  Musculoskeletal:        General: Normal range of motion.     Cervical back: Normal range of motion and neck supple.  Neurological:     General: No focal deficit present.     Mental Status: He is alert and oriented to person, place, and time.    Review of Systems  Constitutional: Negative.   HENT: Negative.    Eyes: Negative.   Respiratory: Negative.    Cardiovascular: Negative.   Gastrointestinal: Negative.   Genitourinary: Negative.   Musculoskeletal: Negative.   Skin: Negative.   Neurological: Negative.   Endo/Heme/Allergies: Negative.   Psychiatric/Behavioral:  Positive for hallucinations. The patient is nervous/anxious and has insomnia.    Blood pressure 121/60, pulse 94, temperature 98.2 F (36.8 C), temperature source Oral, resp. rate 18, SpO2 97%. There is no height or weight on file to calculate BMI.  Demographic Factors:  Male and Adolescent or young adult  Loss Factors: NA  Historical Factors: NA  Risk Reduction Factors:   Employed, Living with another person, especially a relative, and Positive social support  Continued Clinical Symptoms:  Bipolar Disorder:   Mixed State Depression:   Delusional Impulsivity Insomnia Severe Currently Psychotic  Cognitive Features That Contribute To Risk:  None    Suicide  Risk:  Minimal: No identifiable suicidal ideation.  Patients presenting with no risk factors but with morbid ruminations; may be classified as minimal risk based on the severity of the depressive symptoms  Plan Of Care/Follow-up recommendations:  Activity:  As tolerated Diet:  Regular  Disposition: Transfer to [facility] for admission and treatment  Elston Halsted, NP 12/29/2023, 9:17 AM

## 2023-12-29 NOTE — ED Notes (Signed)
 Stable. A&O x 4.  VOL.  Transferring to [facility] via [facility] .     Denies current SI plan and Intent.  Denies HI and A/V hallucinations.   2 pt belongings bags and 1 back pack and all valuables sent with patient and Safe transport staff

## 2023-12-29 NOTE — ED Notes (Signed)
 Pt observed/assessed in recliner sleeping. RR even and unlabored, appearing in no noted distress. Environmental check complete, will continue to monitor for safety

## 2023-12-29 NOTE — ED Notes (Signed)
 Pt A&O x 4, interacting with staff and other pt's on the unit.  No distress noted.  Monitoring for safety.

## 2023-12-29 NOTE — ED Notes (Signed)
 Pt presented at nurse station.  Calm upon approach.  Pressured speech. Elevated mood. Grandiose.   Denied current SI plan and intent. Denied A/V hallucinations/  Hourly observations in place for safety

## 2024-07-22 ENCOUNTER — Other Ambulatory Visit (HOSPITAL_COMMUNITY): Payer: Self-pay | Admitting: Student
